# Patient Record
Sex: Male | Born: 1935 | Race: White | Hispanic: No | Marital: Married | Smoking: Former smoker
Health system: Southern US, Community
[De-identification: ages and names within clinical notes are randomized; demographics above are authoritative.]

## PROBLEM LIST (undated history)

## (undated) DIAGNOSIS — J449 Chronic obstructive pulmonary disease, unspecified: Secondary | ICD-10-CM

## (undated) DIAGNOSIS — I2510 Atherosclerotic heart disease of native coronary artery without angina pectoris: Secondary | ICD-10-CM

## (undated) DIAGNOSIS — I10 Essential (primary) hypertension: Secondary | ICD-10-CM

## (undated) DIAGNOSIS — I442 Atrioventricular block, complete: Secondary | ICD-10-CM

## (undated) DIAGNOSIS — E785 Hyperlipidemia, unspecified: Secondary | ICD-10-CM

## (undated) HISTORY — PX: PACEMAKER IMPLANT: EP1218

---

## 2013-08-26 ENCOUNTER — Ambulatory Visit: Payer: Self-pay | Admitting: Cardiology

## 2013-08-26 LAB — CBC WITH DIFFERENTIAL/PLATELET
BASOS ABS: 0 10*3/uL (ref 0.0–0.1)
Basophil %: 0.6 %
EOS ABS: 0.3 10*3/uL (ref 0.0–0.7)
Eosinophil %: 4.1 %
HCT: 41.6 % (ref 40.0–52.0)
HGB: 14 g/dL (ref 13.0–18.0)
Lymphocyte #: 1.7 10*3/uL (ref 1.0–3.6)
Lymphocyte %: 23.4 %
MCH: 33.9 pg (ref 26.0–34.0)
MCHC: 33.7 g/dL (ref 32.0–36.0)
MCV: 101 fL — ABNORMAL HIGH (ref 80–100)
MONOS PCT: 8.2 %
Monocyte #: 0.6 x10 3/mm (ref 0.2–1.0)
Neutrophil #: 4.5 10*3/uL (ref 1.4–6.5)
Neutrophil %: 63.7 %
Platelet: 152 10*3/uL (ref 150–440)
RBC: 4.14 10*6/uL — ABNORMAL LOW (ref 4.40–5.90)
RDW: 13.9 % (ref 11.5–14.5)
WBC: 7.1 10*3/uL (ref 3.8–10.6)

## 2013-08-26 LAB — BASIC METABOLIC PANEL
Anion Gap: 5 — ABNORMAL LOW (ref 7–16)
BUN: 16 mg/dL (ref 7–18)
CHLORIDE: 103 mmol/L (ref 98–107)
Calcium, Total: 9.1 mg/dL (ref 8.5–10.1)
Co2: 30 mmol/L (ref 21–32)
Creatinine: 1.04 mg/dL (ref 0.60–1.30)
EGFR (African American): >60
EGFR (Non-African Amer.): >60
Glucose: 93 mg/dL (ref 65–99)
OSMOLALITY: 277 (ref 275–301)
POTASSIUM: 4.1 mmol/L (ref 3.5–5.1)
SODIUM: 138 mmol/L (ref 136–145)

## 2013-08-26 LAB — APTT: ACTIVATED PTT: 29.5 s (ref 23.6–35.9)

## 2013-08-26 LAB — PROTIME-INR
INR: 1
PROTHROMBIN TIME: 13.3 s (ref 11.5–14.7)

## 2013-09-09 ENCOUNTER — Ambulatory Visit: Payer: Self-pay | Admitting: Cardiology

## 2014-07-16 NOTE — Op Note (Signed)
PATIENT NAME:  Gary York, Gary York MR#:  [id number] DATE OF BIRTH:  [birth year]  DATE OF PROCEDURE:  09/09/2013  PRIMARY CARE PHYSICIAN:  Dr. Burnadette PopLinthavong  PREPROCEDURE DIAGNOSES:  1.  Complete heart block.  2.  Elective replacement indication.   PROCEDURE: Dual-chamber pacemaker generator change out.   OPERATOR:  Marcina MillardAlexander Paraschos, MD  INDICATION: The patient is an 81 year old gentleman, status post dual-chamber pacemaker for complete heart block. Recent pacemaker interrogation has shown the pacemaker was at elective replacement indication, with a ventricular escape rate in the 30s and 40s. The procedure risks, benefits, and alternatives of dual-chamber pacemaker generator change out were explained to the patient, and informed written consent was obtained.   DESCRIPTION OF PROCEDURE: The patient was brought to the Operating Room in a fasting state. The right pectoral region was prepped and draped in a sterile manner. Anesthesia was obtained with 1% Xylocaine locally. A 6 cm incision was performed over the old pacemaker generator site. The pacemaker generator was retrieved by electrocautery and blunt dissection. The leads were disconnected from the pacemaker generator and interrogated. After proper thresholds were obtained, the leads were reconnected to a new dual-chamber rate-responsive pacemaker generator ([employer] [id number]) and positioned in the pocket. The pocket was irrigated with gentamicin solution. The incision was closed with 2-0 and 4-0 Vicryl, respectively. Steri-Strips and a pressure dressing were applied.    ____________________________ Marcina MillardAlexander Paraschos, MD ap:mr D: 09/09/2013 13:34:19 ET T: 09/09/2013 20:30:50 ET JOB#: [id number]  cc: Marcina MillardAlexander Paraschos, MD, <Dictator> Marcina MillardALEXANDER PARASCHOS MD ELECTRONICALLY SIGNED 09/16/2013 8:28

## 2016-03-25 DEATH — deceased

## 2017-02-04 ENCOUNTER — Emergency Department
Admission: EM | Admit: 2017-02-04 | Discharge: 2017-02-04 | Disposition: A | Discharge status: Home / Self Care | Payer: Self-pay

## 2017-02-04 NOTE — ED Notes (Signed)
PT went to bathroom before triage couple be finished and was able to have a BM. PT reports relief from abd pain and requests to go home. PT no longer wants blood or vitals to be checked. PT in NAD and daughter verbalized feeling safe taking pt home.

## 2017-09-03 ENCOUNTER — Encounter: Payer: Self-pay | Admitting: Emergency Medicine

## 2017-09-03 ENCOUNTER — Other Ambulatory Visit: Payer: Self-pay

## 2017-09-03 ENCOUNTER — Emergency Department
Admission: EM | Admit: 2017-09-03 | Discharge: 2017-09-03 | Disposition: A | Discharge status: Home / Self Care | Payer: Medicare Other | Attending: Emergency Medicine | Admitting: Emergency Medicine

## 2017-09-03 ENCOUNTER — Emergency Department: Payer: Medicare Other

## 2017-09-03 DIAGNOSIS — R05 Cough: Secondary | ICD-10-CM | POA: Insufficient documentation

## 2017-09-03 DIAGNOSIS — Z87891 Personal history of nicotine dependence: Secondary | ICD-10-CM | POA: Insufficient documentation

## 2017-09-03 DIAGNOSIS — R1012 Left upper quadrant pain: Secondary | ICD-10-CM | POA: Insufficient documentation

## 2017-09-03 DIAGNOSIS — R059 Cough, unspecified: Secondary | ICD-10-CM

## 2017-09-03 DIAGNOSIS — R079 Chest pain, unspecified: Secondary | ICD-10-CM | POA: Diagnosis present

## 2017-09-03 DIAGNOSIS — I10 Essential (primary) hypertension: Secondary | ICD-10-CM | POA: Diagnosis not present

## 2017-09-03 DIAGNOSIS — R0789 Other chest pain: Secondary | ICD-10-CM

## 2017-09-03 HISTORY — DX: Essential (primary) hypertension: I10

## 2017-09-03 LAB — COMPREHENSIVE METABOLIC PANEL
ALT: 28 U/L (ref 17–63)
ANION GAP: 7 (ref 5–15)
AST: 26 U/L (ref 15–41)
Albumin: 4.3 g/dL (ref 3.5–5.0)
Alkaline Phosphatase: 43 U/L (ref 38–126)
BILIRUBIN TOTAL: 0.7 mg/dL (ref 0.3–1.2)
BUN: 14 mg/dL (ref 6–20)
CALCIUM: 9.2 mg/dL (ref 8.9–10.3)
CO2: 28 mmol/L (ref 22–32)
Chloride: 96 mmol/L — ABNORMAL LOW (ref 101–111)
Creatinine, Ser: 0.98 mg/dL (ref 0.61–1.24)
Glucose, Bld: 128 mg/dL — ABNORMAL HIGH (ref 65–99)
Potassium: 4.2 mmol/L (ref 3.5–5.1)
SODIUM: 131 mmol/L — AB (ref 135–145)
TOTAL PROTEIN: 7.2 g/dL (ref 6.5–8.1)

## 2017-09-03 LAB — URINALYSIS, COMPLETE (UACMP) WITH MICROSCOPIC
BILIRUBIN URINE: NEGATIVE
Bacteria, UA: NONE SEEN
GLUCOSE, UA: NEGATIVE mg/dL
HGB URINE DIPSTICK: NEGATIVE
Ketones, ur: NEGATIVE mg/dL
Leukocytes, UA: NEGATIVE
NITRITE: NEGATIVE
PROTEIN: NEGATIVE mg/dL
Specific Gravity, Urine: 1.01 (ref 1.005–1.030)
Squamous Epithelial / LPF: NONE SEEN (ref 0–5)
pH: 7 (ref 5.0–8.0)

## 2017-09-03 LAB — CBC
HCT: 38.2 % — ABNORMAL LOW (ref 40.0–52.0)
HEMOGLOBIN: 13.5 g/dL (ref 13.0–18.0)
MCH: 35.2 pg — ABNORMAL HIGH (ref 26.0–34.0)
MCHC: 35.4 g/dL (ref 32.0–36.0)
MCV: 99.3 fL (ref 80.0–100.0)
Platelets: 193 10*3/uL (ref 150–440)
RBC: 3.85 MIL/uL — AB (ref 4.40–5.90)
RDW: 14.1 % (ref 11.5–14.5)
WBC: 8.2 10*3/uL (ref 3.8–10.6)

## 2017-09-03 LAB — LIPASE, BLOOD: Lipase: 21 U/L (ref 11–51)

## 2017-09-03 LAB — TROPONIN I

## 2017-09-03 MED ORDER — BENZONATATE 100 MG PO CAPS: 100.0000 mg | ORAL_CAPSULE | Freq: Three times a day (TID) | ORAL | 0 refills | Status: DC | PRN

## 2017-09-03 MED ORDER — GUAIFENESIN-CODEINE 100-10 MG/5ML PO SOLN: 5.0000 mL | Freq: Four times a day (QID) | ORAL | 0 refills | Status: DC | PRN

## 2017-09-03 NOTE — ED Provider Notes (Signed)
[facility] Emergency Department Provider Note  Time seen: 10:49 AM  I have reviewed the triage vital signs and the nursing notes.   HISTORY  Chief Complaint Abdominal Pain    HPI Gary York is a 84 y.o. male with a past medical history of hypertension presents to the emergency department for left lower chest/left upper abdominal discomfort.  According to the patient he has a chronic cough, states he has had it for 20 years.  Daughter states it appears to be somewhat worse recently when compared to baseline.  Since last night he has been complaining of pain in his left upper quadrant/left lower chest with cough only.  Denies any nausea, shortness of breath, diaphoresis.  Occasionally will spit up a clear sputum per patient.  Denies any fever.  Denies vomiting diarrhea dysuria or hematuria.   Past Medical History:  Diagnosis Date  . Hypertension     There are no active problems to display for this patient.   Past Surgical History:  Procedure Laterality Date  . PACEMAKER IMPLANT      Prior to Admission medications   Not on File    No Known Allergies  History reviewed. No pertinent family history.  Social History Social History   Tobacco Use  . Smoking status: Former Smoker    Packs/day: 1.00    Years: 20.00    Pack years: 20.00    Types: Cigarettes    Last attempt to quit: 1990    Years since quitting: 29.4  . Smokeless tobacco: Never Used  Substance Use Topics  . Alcohol use: Not on file  . Drug use: Not on file    Review of Systems Constitutional: Negative for fever. Eyes: Negative for visual complaints ENT: Negative for recent illness/congestion Cardiovascular: Mild left lower chest pain only when coughing Respiratory: Negative for shortness of breath.  Positive for cough. Gastrointestinal: Mild left upper quadrant pain only when coughing.  Negative for vomiting or diarrhea. Genitourinary: Negative for hematuria or  dysuria Musculoskeletal: Negative for musculoskeletal complaints Skin: Negative for skin complaints  Neurological: Negative for headache All other ROS negative  ____________________________________________   PHYSICAL EXAM:  VITAL SIGNS: ED Triage Vitals  Enc Vitals Group     BP 09/03/17 0851 (!) 173/77     Pulse Rate 09/03/17 0851 70     Resp 09/03/17 0851 18     Temp 09/03/17 0851 97.6 F (36.4 C)     Temp Source 09/03/17 0851 Oral     SpO2 09/03/17 0851 95 %     Weight 09/03/17 0852 230 lb (104.3 kg)     Height 09/03/17 0852 5\' 9"  (1.753 m)     Head Circumference --      Peak Flow --      Pain Score 09/03/17 0851 5     Pain Loc --      Pain Edu? --      Excl. in GC? --    Constitutional: Alert and oriented. Well appearing and in no distress. Eyes: Normal exam ENT   Head: Normocephalic and atraumatic.   Mouth/Throat: Mucous membranes are moist. Cardiovascular: Normal rate, regular rhythm. Respiratory: Normal respiratory effort without tachypnea nor retractions. Breath sounds are clear  Gastrointestinal: Soft, states slight tenderness in left upper quadrant with palpation.  No rebound, guarding or distention. Musculoskeletal: Nontender with normal range of motion in all extremities.  Slight pedal edema bilaterally. Neurologic:  Normal speech and language. No gross focal neurologic deficits Skin:  Skin is warm, dry and intact.  Psychiatric: Mood and affect are normal.   ____________________________________________    EKG  EKG reviewed and interpreted by myself shows an atrial ventricular paced rhythm at 70 bpm with no concerning findings.  ____________________________________________    RADIOLOGY  Chest x-ray negative for acute abnormality.  ____________________________________________   INITIAL IMPRESSION / ASSESSMENT AND PLAN / ED COURSE  Pertinent labs & imaging results that were available during my care of the patient were reviewed by me and  considered in my medical decision making (see chart for details).  Patient presents to the emergency department for left lower chest, left upper abdominal discomfort but only when coughing.  States he has been coughing for 20 years, daughter states it is somewhat worse recently.  Differential includes pneumonia, chest wall discomfort, muscular strain, pneumothorax, ACS.  Chest x-ray is negative.  Labs are within normal limits including negative troponin.  EKG shows no acute findings.  Overall the patient appears very well, highly suspect muscular strain due to cough.  We will treat the patient's cough with Tessalon Perles during the day and a codeine cough medication tonight.  Patient agreeable to this plan of care.  Family agreeable to plan of care.  I discussed return precautions. ____________________________________________   FINAL CLINICAL IMPRESSION(S) / ED DIAGNOSES  Cough Chest wall pain    Minna Antis, MD 09/03/17 1111

## 2017-09-03 NOTE — ED Notes (Signed)

## 2017-09-03 NOTE — ED Triage Notes (Signed)
[facility] ED [facility] c/o pain to mid L abd starting last night. Pt denies n/v/d. EMS called by pt's daughter. EMS also report pt's spouse passed within the few weeks.

## 2017-09-09 ENCOUNTER — Inpatient Hospital Stay
Admit: 2017-09-09 | Discharge: 2017-09-09 | Disposition: A | Discharge status: Home / Self Care | Payer: Medicare Other | Attending: Internal Medicine | Admitting: Internal Medicine

## 2017-09-09 ENCOUNTER — Inpatient Hospital Stay
Admission: EM | Admit: 2017-09-09 | Discharge: 2017-09-12 | DRG: 281 | Disposition: A | Discharge status: Home / Self Care | Payer: Medicare Other | Attending: Internal Medicine | Admitting: Internal Medicine

## 2017-09-09 ENCOUNTER — Emergency Department: Payer: Medicare Other

## 2017-09-09 ENCOUNTER — Other Ambulatory Visit: Payer: Self-pay

## 2017-09-09 DIAGNOSIS — I25110 Atherosclerotic heart disease of native coronary artery with unstable angina pectoris: Secondary | ICD-10-CM | POA: Diagnosis present

## 2017-09-09 DIAGNOSIS — E782 Mixed hyperlipidemia: Secondary | ICD-10-CM | POA: Diagnosis present

## 2017-09-09 DIAGNOSIS — H9190 Unspecified hearing loss, unspecified ear: Secondary | ICD-10-CM | POA: Diagnosis present

## 2017-09-09 DIAGNOSIS — Z8679 Personal history of other diseases of the circulatory system: Secondary | ICD-10-CM | POA: Diagnosis not present

## 2017-09-09 DIAGNOSIS — Z950 Presence of cardiac pacemaker: Secondary | ICD-10-CM | POA: Diagnosis not present

## 2017-09-09 DIAGNOSIS — J42 Unspecified chronic bronchitis: Secondary | ICD-10-CM | POA: Diagnosis present

## 2017-09-09 DIAGNOSIS — E860 Dehydration: Secondary | ICD-10-CM | POA: Diagnosis present

## 2017-09-09 DIAGNOSIS — I10 Essential (primary) hypertension: Secondary | ICD-10-CM | POA: Diagnosis present

## 2017-09-09 DIAGNOSIS — Z79899 Other long term (current) drug therapy: Secondary | ICD-10-CM | POA: Diagnosis not present

## 2017-09-09 DIAGNOSIS — Z634 Disappearance and death of family member: Secondary | ICD-10-CM

## 2017-09-09 DIAGNOSIS — R079 Chest pain, unspecified: Secondary | ICD-10-CM | POA: Diagnosis present

## 2017-09-09 DIAGNOSIS — E222 Syndrome of inappropriate secretion of antidiuretic hormone: Secondary | ICD-10-CM | POA: Diagnosis present

## 2017-09-09 DIAGNOSIS — I214 Non-ST elevation (NSTEMI) myocardial infarction: Principal | ICD-10-CM | POA: Diagnosis present

## 2017-09-09 DIAGNOSIS — Z66 Do not resuscitate: Secondary | ICD-10-CM | POA: Diagnosis present

## 2017-09-09 DIAGNOSIS — I700 Atherosclerosis of aorta: Secondary | ICD-10-CM | POA: Diagnosis present

## 2017-09-09 DIAGNOSIS — Z87891 Personal history of nicotine dependence: Secondary | ICD-10-CM

## 2017-09-09 HISTORY — DX: Atrioventricular block, complete: I44.2

## 2017-09-09 LAB — CBC WITH DIFFERENTIAL/PLATELET
Basophils Absolute: 0 10*3/uL (ref 0–0.1)
Basophils Relative: 0 %
EOS ABS: 0.3 10*3/uL (ref 0–0.7)
EOS PCT: 3 %
HCT: 39.9 % — ABNORMAL LOW (ref 40.0–52.0)
Hemoglobin: 13.8 g/dL (ref 13.0–18.0)
LYMPHS ABS: 1.5 10*3/uL (ref 1.0–3.6)
Lymphocytes Relative: 15 %
MCH: 34.2 pg — AB (ref 26.0–34.0)
MCHC: 34.6 g/dL (ref 32.0–36.0)
MCV: 98.9 fL (ref 80.0–100.0)
MONOS PCT: 7 %
Monocytes Absolute: 0.7 10*3/uL (ref 0.2–1.0)
Neutro Abs: 7.5 10*3/uL — ABNORMAL HIGH (ref 1.4–6.5)
Neutrophils Relative %: 75 %
PLATELETS: 189 10*3/uL (ref 150–440)
RBC: 4.04 MIL/uL — ABNORMAL LOW (ref 4.40–5.90)
RDW: 14.2 % (ref 11.5–14.5)
WBC: 10 10*3/uL (ref 3.8–10.6)

## 2017-09-09 LAB — SODIUM
Sodium: 127 mmol/L — ABNORMAL LOW (ref 135–145)
Sodium: 127 mmol/L — ABNORMAL LOW (ref 135–145)

## 2017-09-09 LAB — COMPREHENSIVE METABOLIC PANEL
ALT: 41 U/L (ref 17–63)
ANION GAP: 9 (ref 5–15)
AST: 33 U/L (ref 15–41)
Albumin: 4.5 g/dL (ref 3.5–5.0)
Alkaline Phosphatase: 43 U/L (ref 38–126)
BUN: 21 mg/dL — ABNORMAL HIGH (ref 6–20)
CALCIUM: 8.6 mg/dL — AB (ref 8.9–10.3)
CHLORIDE: 89 mmol/L — AB (ref 101–111)
CO2: 25 mmol/L (ref 22–32)
Creatinine, Ser: 1.13 mg/dL (ref 0.61–1.24)
GFR calc non Af Amer: 54 mL/min — ABNORMAL LOW (ref >60)
Glucose, Bld: 138 mg/dL — ABNORMAL HIGH (ref 65–99)
Potassium: 4.5 mmol/L (ref 3.5–5.1)
SODIUM: 123 mmol/L — AB (ref 135–145)
Total Bilirubin: 0.6 mg/dL (ref 0.3–1.2)
Total Protein: 7.3 g/dL (ref 6.5–8.1)

## 2017-09-09 LAB — TROPONIN I
Troponin I: 0.11 ng/mL (ref <0.03)
Troponin I: 0.92 ng/mL (ref <0.03)
Troponin I: 1.22 ng/mL (ref <0.03)

## 2017-09-09 LAB — CHLORIDE, URINE, RANDOM: Chloride Urine: 17 mmol/L

## 2017-09-09 LAB — SODIUM, URINE, RANDOM: Sodium, Ur: 22 mmol/L

## 2017-09-09 MED ORDER — METOPROLOL TARTRATE 25 MG PO TABS
25.0000 mg | ORAL_TABLET | Freq: Two times a day (BID) | ORAL | Status: DC
  Administered 2017-09-09 – 2017-09-12 (×6): 25 mg via ORAL
  Filled 2017-09-09 (×6): qty 1

## 2017-09-09 MED ORDER — NITROGLYCERIN 0.4 MG SL SUBL
0.4000 mg | SUBLINGUAL_TABLET | Freq: Once | SUBLINGUAL | Status: AC
  Administered 2017-09-09: 0.4 mg via SUBLINGUAL

## 2017-09-09 MED ORDER — DOXAZOSIN MESYLATE 2 MG PO TABS
2.0000 mg | ORAL_TABLET | Freq: Every day | ORAL | Status: DC
  Administered 2017-09-09 – 2017-09-12 (×4): 2 mg via ORAL
  Filled 2017-09-09 (×4): qty 1

## 2017-09-09 MED ORDER — NITROGLYCERIN 0.4 MG SL SUBL: 0.4000 mg | SUBLINGUAL_TABLET | SUBLINGUAL | Status: DC | PRN

## 2017-09-09 MED ORDER — LOSARTAN POTASSIUM 50 MG PO TABS
100.0000 mg | ORAL_TABLET | Freq: Every day | ORAL | Status: DC
  Administered 2017-09-09 – 2017-09-12 (×4): 100 mg via ORAL
  Filled 2017-09-09 (×4): qty 2

## 2017-09-09 MED ORDER — ENOXAPARIN SODIUM 40 MG/0.4ML ~~LOC~~ SOLN
40.0000 mg | Freq: Every day | SUBCUTANEOUS | Status: DC
  Administered 2017-09-09: 40 mg via SUBCUTANEOUS
  Filled 2017-09-09: qty 0.4

## 2017-09-09 MED ORDER — DILTIAZEM HCL ER COATED BEADS 180 MG PO CP24
180.0000 mg | ORAL_CAPSULE | Freq: Every day | ORAL | Status: DC
  Administered 2017-09-09 – 2017-09-12 (×4): 180 mg via ORAL
  Filled 2017-09-09 (×4): qty 1

## 2017-09-09 MED ORDER — SODIUM CHLORIDE 0.9 % IV SOLN
Freq: Once | INTRAVENOUS | Status: AC
  Administered 2017-09-09: 12:00:00 via INTRAVENOUS

## 2017-09-09 MED ORDER — ONDANSETRON HCL 4 MG/2ML IJ SOLN: 4.0000 mg | Freq: Four times a day (QID) | INTRAMUSCULAR | Status: DC | PRN

## 2017-09-09 MED ORDER — BENZONATATE 100 MG PO CAPS
100.0000 mg | ORAL_CAPSULE | Freq: Three times a day (TID) | ORAL | Status: DC
  Administered 2017-09-09 – 2017-09-12 (×10): 100 mg via ORAL
  Filled 2017-09-09 (×10): qty 1

## 2017-09-09 MED ORDER — ENOXAPARIN SODIUM 40 MG/0.4ML ~~LOC~~ SOLN: 40.0000 mg | SUBCUTANEOUS | Status: DC

## 2017-09-09 MED ORDER — GUAIFENESIN-CODEINE 100-10 MG/5ML PO SOLN
5.0000 mL | Freq: Four times a day (QID) | ORAL | Status: DC | PRN
  Filled 2017-09-09: qty 5

## 2017-09-09 MED ORDER — NITROGLYCERIN 0.4 MG SL SUBL
0.4000 mg | SUBLINGUAL_TABLET | Freq: Once | SUBLINGUAL | Status: AC
  Administered 2017-09-09: 0.4 mg via SUBLINGUAL
  Filled 2017-09-09: qty 1

## 2017-09-09 MED ORDER — ASPIRIN EC 81 MG PO TBEC
81.0000 mg | DELAYED_RELEASE_TABLET | Freq: Every day | ORAL | Status: DC
  Administered 2017-09-10 – 2017-09-12 (×3): 81 mg via ORAL
  Filled 2017-09-09 (×3): qty 1

## 2017-09-09 MED ORDER — ISOSORBIDE MONONITRATE ER 30 MG PO TB24
30.0000 mg | ORAL_TABLET | Freq: Every day | ORAL | Status: DC
  Administered 2017-09-09 – 2017-09-12 (×4): 30 mg via ORAL
  Filled 2017-09-09 (×4): qty 1

## 2017-09-09 MED ORDER — LOSARTAN POTASSIUM 50 MG PO TABS: 100.0000 mg | ORAL_TABLET | Freq: Every day | ORAL | Status: DC

## 2017-09-09 MED ORDER — METOPROLOL TARTRATE 25 MG PO TABS: 12.5000 mg | ORAL_TABLET | Freq: Two times a day (BID) | ORAL | Status: DC

## 2017-09-09 MED ORDER — ATORVASTATIN CALCIUM 20 MG PO TABS
20.0000 mg | ORAL_TABLET | Freq: Every day | ORAL | Status: DC
  Administered 2017-09-09 – 2017-09-10 (×2): 20 mg via ORAL
  Filled 2017-09-09 (×2): qty 1

## 2017-09-09 MED ORDER — ACETAMINOPHEN 325 MG PO TABS: 650.0000 mg | ORAL_TABLET | Freq: Four times a day (QID) | ORAL | Status: DC | PRN

## 2017-09-09 MED ORDER — ACETAMINOPHEN 650 MG RE SUPP: 650.0000 mg | Freq: Four times a day (QID) | RECTAL | Status: DC | PRN

## 2017-09-09 MED ORDER — FAMOTIDINE 20 MG PO TABS
20.0000 mg | ORAL_TABLET | Freq: Every day | ORAL | Status: DC
  Administered 2017-09-09 – 2017-09-12 (×4): 20 mg via ORAL
  Filled 2017-09-09 (×4): qty 1

## 2017-09-09 MED ORDER — ONDANSETRON HCL 4 MG PO TABS: 4.0000 mg | ORAL_TABLET | Freq: Four times a day (QID) | ORAL | Status: DC | PRN

## 2017-09-09 MED ORDER — SENNOSIDES-DOCUSATE SODIUM 8.6-50 MG PO TABS: 1.0000 | ORAL_TABLET | Freq: Every evening | ORAL | Status: DC | PRN

## 2017-09-09 MED ORDER — CLOPIDOGREL BISULFATE 75 MG PO TABS
75.0000 mg | ORAL_TABLET | Freq: Every day | ORAL | Status: DC
  Administered 2017-09-09 – 2017-09-12 (×4): 75 mg via ORAL
  Filled 2017-09-09 (×4): qty 1

## 2017-09-09 MED ORDER — SODIUM CHLORIDE 0.9 % IV SOLN
INTRAVENOUS | Status: DC
  Administered 2017-09-09 – 2017-09-10 (×3): via INTRAVENOUS

## 2017-09-09 MED ORDER — ACETAMINOPHEN 325 MG PO TABS: 650.0000 mg | ORAL_TABLET | ORAL | Status: DC | PRN

## 2017-09-09 MED ORDER — ASPIRIN 81 MG PO CHEW
324.0000 mg | CHEWABLE_TABLET | Freq: Once | ORAL | Status: AC
  Administered 2017-09-09: 324 mg via ORAL
  Filled 2017-09-09: qty 4

## 2017-09-09 NOTE — ED Notes (Signed)
Green top recollected per lab request.  

## 2017-09-09 NOTE — ED Provider Notes (Signed)
[facility] Emergency Department Provider Note       Time seen: ----------------------------------------- 9:40 AM on 09/09/2017 -----------------------------------------   I have reviewed the triage vital signs and the nursing notes.  HISTORY   Chief Complaint Chest Pain    HPI Gary York is a 84 y.o. male with a history of hypertension who presents to the ED for chest pain.  Patient describes chest pain that started around 8:30 AM.  Patient states he has dull pain over his right breast it is 8 out of 10.  He states occasionally he has had difficulty getting his breath.  He also notes that his wife died last week under hospice care.  He denies sweats, nausea, shortness of breath.  Past Medical History:  Diagnosis Date  . Hypertension     There are no active problems to display for this patient.   Past Surgical History:  Procedure Laterality Date  . PACEMAKER IMPLANT      Allergies Patient has no known allergies.  Social History Social History   Tobacco Use  . Smoking status: Former Smoker    Packs/day: 1.00    Years: 20.00    Pack years: 20.00    Types: Cigarettes    Last attempt to quit: 1990    Years since quitting: 29.4  . Smokeless tobacco: Never Used  Substance Use Topics  . Alcohol use: Not Currently  . Drug use: Never   Review of Systems Constitutional: Negative for fever. Cardiovascular: Positive for chest pain Respiratory: Negative for shortness of breath. Gastrointestinal: Negative for abdominal pain, vomiting and diarrhea. Musculoskeletal: Negative for back pain. Skin: Negative for rash. Neurological: Negative for headaches, focal weakness or numbness.  All systems negative/normal/unremarkable except as stated in the HPI  ____________________________________________   PHYSICAL EXAM:  VITAL SIGNS: ED Triage Vitals  Enc Vitals Group     BP 09/09/17 0929 (!) 162/91     Pulse Rate 09/09/17 0929 72     Resp  09/09/17 0929 17     Temp 09/09/17 0929 98.4 F (36.9 C)     Temp Source 09/09/17 0929 Oral     SpO2 09/09/17 0921 97 %     Weight 09/09/17 0923 200 lb (90.7 kg)     Height 09/09/17 0923 5\' 9"  (1.753 m)     Head Circumference --      Peak Flow --      Pain Score 09/09/17 0923 8     Pain Loc --      Pain Edu? --      Excl. in GC? --    Constitutional: Alert and oriented. Well appearing and in no distress. Eyes: Conjunctivae are normal. Normal extraocular movements. ENT   Head: Normocephalic and atraumatic.   Nose: No congestion/rhinnorhea.   Mouth/Throat: Mucous membranes are moist.   Neck: No stridor. Cardiovascular: Normal rate, regular rhythm. No murmurs, rubs, or gallops. Respiratory: Normal respiratory effort without tachypnea nor retractions. Breath sounds are clear and equal bilaterally. No wheezes/rales/rhonchi. Gastrointestinal: Soft and nontender. Normal bowel sounds Musculoskeletal: Nontender with normal range of motion in extremities. No lower extremity tenderness nor edema. Neurologic:  Normal speech and language. No gross focal neurologic deficits are appreciated.  Skin:  Skin is warm, dry and intact. No rash noted. Psychiatric: Mood and affect are normal. Speech and behavior are normal.  ____________________________________________  EKG: Interpreted by me.  AV dual paced rhythm with a rate of 70 bpm, normal pacemaker function is noted  ____________________________________________  ED COURSE:  As part of my medical decision making, I reviewed the following data within the electronic MEDICAL RECORD NUMBER History obtained from family if available, nursing notes, old chart and ekg, as well as notes from prior ED visits. Patient presented for right-sided chest pain, we will assess with labs and imaging as indicated at this time.   Procedures ____________________________________________   LABS (pertinent positives/negatives)  Labs Reviewed  CBC WITH  DIFFERENTIAL/PLATELET - Abnormal; Notable for the following components:      Result Value   RBC 4.04 (*)    HCT 39.9 (*)    MCH 34.2 (*)    Neutro Abs 7.5 (*)    All other components within normal limits  COMPREHENSIVE METABOLIC PANEL - Abnormal; Notable for the following components:   Sodium 123 (*)    Chloride 89 (*)    Glucose, Bld 138 (*)    BUN 21 (*)    Calcium 8.6 (*)    GFR calc non Af Amer 54 (*)    All other components within normal limits  TROPONIN I - Abnormal; Notable for the following components:   Troponin I 0.11 (*)    All other components within normal limits    RADIOLOGY Images were viewed by me  Chest x-ray IMPRESSION: Stable chronic elevation of the right hemidiaphragm with chronic right base atelectasis or scarring.  Cardiomegaly.  ____________________________________________  DIFFERENTIAL DIAGNOSIS   Acute grief reaction, anxiety, muscular skeletal pain, unstable angina, PE  FINAL ASSESSMENT AND PLAN  Unstable angina, hyponatremia   Plan: The patient had presented for right-sided chest pain. Patient's labs did indicate hyponatremia as well as an elevated troponin. Patient's imaging does not reveal any acute process.  This is likely due to grief from the loss of his wife.  Have given him aspirin and IV fluids.  I will discuss with the hospitalist for admission.   Gary DashJohnathan E Williams, MD   Note: This note was generated in part or whole with voice recognition software. Voice recognition is usually quite accurate but there are transcription errors that can and very often do occur. I apologize for any typographical errors that were not detected and corrected.     Emily FilbertWilliams, Jonathan E, MD 09/09/17 [phone]

## 2017-09-09 NOTE — Progress Notes (Signed)
Family Meeting Note  Advance Directive:yes  Today a meeting took place with the Patient.    The following clinical team members were present during this meeting:MD  The following were discussed:Patient's diagnosis: Unstable angina elevated troponin, hyponatremia, bereavement reaction, treatment plan of care, other comorbidities including dual-chamber pacemaker, essential hypertension I discussed in detail with the patient.  He verbalized understanding of the plan.     Patient's progosis: Unable to determine and Goals for treatment: DNR  Daughter Kriste BasqueBecky is a healthcare POA.  Patient states he has a living will get a copy to the chart  Additional follow-up to be provided: Hospitalist and cardiology  Time spent during discussion:17 MIN  Ramonita LabAruna Eames Dibiasio, MD

## 2017-09-09 NOTE — H&P (Signed)
[facility] - [place] [facility]   PATIENT NAME: Gary York    MR#:  [id number]  DATE OF BIRTH:  [birth year]  DATE OF ADMISSION:  09/09/2017  PRIMARY CARE PHYSICIAN: Patient, No Pcp Per   REQUESTING/REFERRING PHYSICIAN: Dr. Mayford Knife  CHIEF COMPLAINT:  Chest pain  HISTORY OF PRESENT ILLNESS:  Gary York  is a 84 y.o. male with a known history of hypertension, dual-chamber pacemaker sees Dr. Gwen Pounds as an outpatient is presented to the ED with a chief complaint of chest pain.  Patient states chest pain started at around 8:30 AM , locating to right lateral part of the chest with no radiation.  Denies any shortness of breath.   patient states his wife deceased last week and feels down but has very good family support.  Daughter and son are involved.  PAST MEDICAL HISTORY:   Past Medical History:  Diagnosis Date  . Hypertension     PAST SURGICAL HISTOIRY:   Past Surgical History:  Procedure Laterality Date  . PACEMAKER IMPLANT      SOCIAL HISTORY:   Social History   Tobacco Use  . Smoking status: Former Smoker    Packs/day: 1.00    Years: 20.00    Pack years: 20.00    Types: Cigarettes    Last attempt to quit: 1990    Years since quitting: 29.4  . Smokeless tobacco: Never Used  Substance Use Topics  . Alcohol use: Not Currently    FAMILY HISTORY:  No family history on file.  DRUG ALLERGIES:  No Known Allergies  REVIEW OF SYSTEMS:  CONSTITUTIONAL: No fever, fatigue or weakness.  EYES: No blurred or double vision.  EARS, NOSE, AND THROAT: No tinnitus or ear pain.  RESPIRATORY: No cough, shortness of breath, wheezing or hemoptysis.  CARDIOVASCULAR: Reporting right-sided chest pain, denies orthopnea, edema.  GASTROINTESTINAL: No nausea, vomiting, diarrhea or abdominal pain.  GENITOURINARY: No dysuria, hematuria.  ENDOCRINE: No polyuria, nocturia,  HEMATOLOGY: No anemia, easy bruising or bleeding SKIN: No rash or  lesion. MUSCULOSKELETAL: No joint pain or arthritis.   NEUROLOGIC: No tingling, numbness, weakness.  PSYCHIATRY: No anxiety or depression.   MEDICATIONS AT HOME:   Prior to Admission medications   Medication Sig Start Date End Date Taking? Authorizing Provider  benzonatate (TESSALON PERLES) 100 MG capsule Take 1 capsule (100 mg total) by mouth 3 (three) times daily as needed for up to 10 days for cough. 09/03/17 09/13/17 Yes Minna Antis, MD  diltiazem (DILTIAZEM CD) 180 MG 24 hr capsule Take 180 mg by mouth daily.   Yes [provider]  doxazosin (CARDURA) 2 MG tablet Take 2 mg by mouth daily. 08/19/17  Yes [provider]  guaiFENesin-codeine 100-10 MG/5ML syrup Take 5 mLs by mouth every 6 (six) hours as needed for cough. 09/03/17  Yes Minna Antis, MD  losartan (COZAAR) 100 MG tablet Take 100 mg by mouth daily. 11/20/16  Yes [provider]      VITAL SIGNS:  Blood pressure (!) 154/84, pulse 68, temperature 98.4 F (36.9 C), temperature source Oral, resp. rate 13, height 5\' 9"  (1.753 m), weight 90.7 kg (200 lb), SpO2 93 %.  PHYSICAL EXAMINATION:  GENERAL:  85 y.o.-year-old patient lying in the bed with no acute distress.  EYES: Pupils equal, round, reactive to light and accommodation. No scleral icterus. Extraocular muscles intact.  HEENT: Head atraumatic, normocephalic. Oropharynx and nasopharynx clear.  NECK:  Supple, no jugular venous distention. No thyroid enlargement, no tenderness.  LUNGS: Normal breath sounds bilaterally, no wheezing, rales,rhonchi or crepitation. No use of accessory muscles of respiration.  CARDIOVASCULAR: S1, S2 normal. No murmurs, rubs, or gallops.  Reproducible right-sided anterior chest wall pain ABDOMEN: Soft, nontender, nondistended. Bowel sounds present.   EXTREMITIES: No pedal edema, cyanosis, or clubbing.  NEUROLOGIC: Cranial nerves II through XII are intact. Muscle strength 5/5 in all extremities. Sensation intact.  Gait not checked.  PSYCHIATRIC: The patient is alert and oriented x 3.  SKIN: No obvious rash, lesion, or ulcer.   LABORATORY PANEL:   CBC Recent Labs  Lab 09/09/17 0927  WBC 10.0  HGB 13.8  HCT 39.9*  PLT 189   ------------------------------------------------------------------------------------------------------------------  Chemistries  Recent Labs  Lab 09/09/17 1048  NA 123*  K 4.5  CL 89*  CO2 25  GLUCOSE 138*  BUN 21*  CREATININE 1.13  CALCIUM 8.6*  AST 33  ALT 41  ALKPHOS 43  BILITOT 0.6   ------------------------------------------------------------------------------------------------------------------  Cardiac Enzymes Recent Labs  Lab 09/09/17 1048  TROPONINI 0.11*   ------------------------------------------------------------------------------------------------------------------  RADIOLOGY:  Dg Chest 2 View  Result Date: 09/09/2017 CLINICAL DATA:  Right chest pain EXAM: CHEST - 2 VIEW COMPARISON:  09/03/2017 FINDINGS: Right pacer remains in place, unchanged. Elevation of the right hemidiaphragm. Mild cardiomegaly. Right base atelectasis or scarring. No confluent opacity on the left. No effusions or edema. No acute bony abnormality. IMPRESSION: Stable chronic elevation of the right hemidiaphragm with chronic right base atelectasis or scarring. Cardiomegaly. Electronically Signed   By: Charlett Nose M.D.   On: 09/09/2017 10:06    EKG:   Orders placed or performed during the hospital encounter of 09/09/17  . ED EKG within 10 minutes  . ED EKG within 10 minutes  . EKG 12-Lead  . EKG 12-Lead  . EKG 12-Lead  . EKG 12-Lead  . EKG 12-Lead  . EKG 12-Lead  . EKG 12-lead  . EKG 12-Lead    IMPRESSION AND PLAN:   Gary York  is a 84 y.o. male with a known history of hypertension, dual-chamber pacemaker sees Dr. Gwen Pounds as an outpatient is presented to the ED with a chief complaint of chest pain.  Patient states chest pain started at around 8:30 AM ,  locating to right lateral part of the chest with no radiation.  Denies any shortness of breath  #Unstable angina with elevated troponin Admit to telemetry Cycle cardiac biomarkers We will get an echocardiogram Aspirin 3.5 mg was given in the ED.  We will continue aspirin 81 mg and continue ACE inhibitor, statin and small dose beta-blocker is added to the regimen Cardiology consult placed to Dr. Gwen Pounds  #Hyponatremia probably from dehydration Provide IV fluids  monitor serial sodium levels  patient is mentating fine Check urine osmolality, urine lites  check TSH   #Dehydration and hydration with IV fluids and check echocardiogram  #Essential hypertension continue Cozaar, Cardura and Cardizem Small dose beta-blocker is added to the regimen  #Bereavement reaction Patient is recuperating well has good family support  continue close monitoring   #Dual Chamber pacemaker Outpatient follow-up with cardiologist as recommended   DVT prophylaxis  All the records are reviewed and case discussed with ED provider. Management plans discussed with the patient, family and they are in agreement.  CODE STATUS: DNR  TOTAL TIME TAKING CARE OF THIS PATIENT: 42 minutes.   Note: This dictation was prepared with Dragon dictation along with smaller phrase technology. Any transcriptional errors that result from this process are unintentional.  Ramonita LabAruna Matai Carpenito M.D on 09/09/2017 at 1:42 PM  Between 7am to 6pm - Pager - [phone]  After 6pm go to [email or web address] - password [email or web address]  [facility]  Office  [phone]  CC: Primary care physician; Patient, No Pcp Per

## 2017-09-09 NOTE — ED Triage Notes (Addendum)
Pt arrived via ems for c/o chest pain that started at 830am- pt states pain is a dull ain over right breast and is 8/10 - denies shortness of breath, dizziness, N/V Pt spouse died last week under care of hospice

## 2017-09-09 NOTE — ED Notes (Signed)
Pt assisted to use urinal to void 

## 2017-09-09 NOTE — ED Notes (Signed)
Elevated troponin of 0.11 reported to Dr Mayford KnifeWilliams - no new orders given to this nurse at this time

## 2017-09-09 NOTE — Consult Note (Signed)
[facility] Cardiology Consultation Note  Patient ID: Gary York, MRN: [id number], DOB/AGE: Dec 23, 1921 84 y.o. Admit date: 09/09/2017   Date of Consult: 09/09/2017 Primary Physician: Patient, No Pcp Per Primary Cardiologist: Gwen Pounds  Chief Complaint:  Chief Complaint  Patient presents with  . Chest Pain   Reason for Consult: Chest pain  HPI: 84 y.o. male with known essential hypertension mixed hyperlipidemia cardiovascular disease with previous heart block status post dual-chamber pacemaker placement.  Patient has done fairly well on appropriate high intensity cholesterol therapy aspirin and metoprolol losartan for hypertension control and heart rate control when he had left-sided and substernal chest discomfort radiating into his back causing shortness of breath.  This lasted throughout the morning and was seen in the emergency room at which time he had an EKG showing AV pacing and had some relief of his chest pain with appropriate medication management.  Since then his pain has subsided to the most degree and now he has an elevated troponin of 0.92 consistent with non-ST elevation myocardial infarction.  We have discussed at length medication management at 84 years old would be the most appropriate approach and agreed by the family and the patient.  Only if ambulation causes worsening chest pain at maximal medication management would we further consider the possibility of further intervention.  Currently the patient is stable  Past Medical History:  Diagnosis Date  . Hypertension       Surgical History:  Past Surgical History:  Procedure Laterality Date  . PACEMAKER IMPLANT       Home Meds: Prior to Admission medications   Medication Sig Start Date End Date Taking? Authorizing Provider  benzonatate (TESSALON PERLES) 100 MG capsule Take 1 capsule (100 mg total) by mouth 3 (three) times daily as needed for up to 10 days for cough. 09/03/17 09/13/17 Yes Minna Antis, MD   diltiazem (DILTIAZEM CD) 180 MG 24 hr capsule Take 180 mg by mouth daily.   Yes [provider]  doxazosin (CARDURA) 2 MG tablet Take 2 mg by mouth daily. 08/19/17  Yes [provider]  guaiFENesin-codeine 100-10 MG/5ML syrup Take 5 mLs by mouth every 6 (six) hours as needed for cough. 09/03/17  Yes Minna Antis, MD  losartan (COZAAR) 100 MG tablet Take 100 mg by mouth daily. 11/20/16  Yes [provider]    Inpatient Medications:  . [START ON 09/10/2017] aspirin EC  81 mg Oral Daily  . atorvastatin  20 mg Oral q1800  . benzonatate  100 mg Oral TID  . diltiazem  180 mg Oral Daily  . doxazosin  2 mg Oral Daily  . enoxaparin (LOVENOX) injection  40 mg Subcutaneous q1800  . famotidine  20 mg Oral Daily  . losartan  100 mg Oral Daily  . metoprolol tartrate  12.5 mg Oral BID   . sodium chloride 75 mL/hr at 09/09/17 1433    Allergies: No Known Allergies  Social History   Socioeconomic History  . Marital status: Married    Spouse name: Not on file  . Number of children: Not on file  . Years of education: Not on file  . Highest education level: Not on file  Occupational History  . Not on file  Social Needs  . Financial resource strain: Not on file  . Food insecurity:    Worry: Not on file    Inability: Not on file  . Transportation needs:    Medical: Not on file    Non-medical: Not on  file  Tobacco Use  . Smoking status: Former Smoker    Packs/day: 1.00    Years: 20.00    Pack years: 20.00    Types: Cigarettes    Last attempt to quit: 1990    Years since quitting: 29.4  . Smokeless tobacco: Never Used  Substance and Sexual Activity  . Alcohol use: Not Currently  . Drug use: Never  . Sexual activity: Not on file  Lifestyle  . Physical activity:    Days per week: Not on file    Minutes per session: Not on file  . Stress: Not on file  Relationships  . Social connections:    Talks on phone: Not on file    Gets together: Not on file     Attends religious service: Not on file    Active member of club or organization: Not on file    Attends meetings of clubs or organizations: Not on file    Relationship status: Not on file  . Intimate partner violence:    Fear of current or ex partner: Not on file    Emotionally abused: Not on file    Physically abused: Not on file    Forced sexual activity: Not on file  Other Topics Concern  . Not on file  Social History Narrative  . Not on file     No family history on file.   Review of Systems Positive for chest pain Negative for: General:  chills, fever, night sweats or weight changes.  Cardiovascular: PND orthopnea syncope dizziness  Dermatological skin lesions rashes Respiratory: Cough congestion Urologic: Frequent urination urination at night and hematuria Abdominal: negative for nausea, vomiting, diarrhea, bright red blood per rectum, melena, or hematemesis Neurologic: negative for visual changes, and/or hearing changes  All other systems reviewed and are otherwise negative except as noted above.  Labs: Recent Labs    09/09/17 1048 09/09/17 1426  TROPONINI 0.11* 0.92*   Lab Results  Component Value Date   WBC 10.0 09/09/2017   HGB 13.8 09/09/2017   HCT 39.9 (L) 09/09/2017   MCV 98.9 09/09/2017   PLT 189 09/09/2017    Recent Labs  Lab 09/09/17 1048 09/09/17 1426  NA 123* 127*  K 4.5  --   CL 89*  --   CO2 25  --   BUN 21*  --   CREATININE 1.13  --   CALCIUM 8.6*  --   PROT 7.3  --   BILITOT 0.6  --   ALKPHOS 43  --   ALT 41  --   AST 33  --   GLUCOSE 138*  --    No results found for: CHOL, HDL, LDLCALC, TRIG No results found for: DDIMER  Radiology/Studies:  Dg Chest 2 View  Result Date: 09/09/2017 CLINICAL DATA:  Right chest pain EXAM: CHEST - 2 VIEW COMPARISON:  09/03/2017 FINDINGS: Right pacer remains in place, unchanged. Elevation of the right hemidiaphragm. Mild cardiomegaly. Right base atelectasis or scarring. No confluent opacity on the  left. No effusions or edema. No acute bony abnormality. IMPRESSION: Stable chronic elevation of the right hemidiaphragm with chronic right base atelectasis or scarring. Cardiomegaly. Electronically Signed   By: Charlett Nose M.D.   On: 09/09/2017 10:06   Dg Chest 2 View  Result Date: 09/03/2017 CLINICAL DATA:  LEFT side anterior lower chest pain with cough for 1 day EXAM: CHEST - 2 VIEW COMPARISON:  08/26/2013 FINDINGS: RIGHT subclavian transvenous pacemaker with sequential leads projecting at RIGHT atrium  and RIGHT ventricle, unchanged. Enlargement of cardiac silhouette. Atherosclerotic calcification aorta. Mediastinal contours and pulmonary vascularity otherwise normal. Increased elevation of RIGHT diaphragm with mild RIGHT basilar atelectasis. Minimal scarring LEFT base. Lungs otherwise clear. No pulmonary infiltrate, pleural effusion or pneumothorax. Bones demineralized. IMPRESSION: RIGHT basilar atelectasis and minimal LEFT basilar scarring. Enlargement of cardiac silhouette post pacemaker. No acute abnormalities. Electronically Signed   By: Ulyses SouthwardMark  Boles M.D.   On: 09/03/2017 09:37    EKG: AV pacing  Weights: Filed Weights   09/09/17 0923 09/09/17 1344  Weight: 200 lb (90.7 kg) 220 lb 12.8 oz (100.2 kg)     Physical Exam: Blood pressure (!) 156/66, pulse 69, temperature 98.2 F (36.8 C), resp. rate 17, height 5\' 9"  (1.753 m), weight 220 lb 12.8 oz (100.2 kg), SpO2 94 %. Body mass index is 32.61 kg/m. General: Well developed, well nourished, in no acute distress. Head eyes ears nose throat: Normocephalic, atraumatic, sclera non-icteric, no xanthomas, nares are without discharge. No apparent thyromegaly and/or mass  Lungs: Normal respiratory effort.  Few wheezes, no rales, no rhonchi.  Heart: RRR with normal S1 S2. no murmur gallop, no rub, PMI is normal size and placement, carotid upstroke normal without bruit, jugular venous pressure is normal Abdomen: Soft, non-tender, non-distended with  normoactive bowel sounds. No hepatomegaly. No rebound/guarding. No obvious abdominal masses. Abdominal aorta is normal size without bruit Extremities: Trace edema. no cyanosis, no clubbing, no ulcers  Peripheral : 2+ bilateral upper extremity pulses, 2+ bilateral femoral pulses, 2+ bilateral dorsal pedal pulse Neuro: Alert and oriented. No facial asymmetry. No focal deficit. Moves all extremities spontaneously. Musculoskeletal: Normal muscle tone without kyphosis Psych:  Responds to questions appropriately with a normal affect.    Assessment: 84 year old male with heart block essential hypertension mixed hyperlipidemia having acute non-ST elevation myocardial infarction  Plan: 1.  Heparin for further risk reduction cardiovascular event 2.  Aspirin and Plavix for myocardial infarction 3.  Beta-blocker angiotensin receptor blocker for hypertension control and risk reduction and further cardiovascular event 4.  High intensity cholesterol therapy 5.  Consider echocardiogram for LV systolic dysfunction valvular heart disease contributing to 6.  Ambulation and follow for improvements of symptoms and need for adjustments of medications or further intervention including the possibility of cardiac catheterization for which she family and patient understand all risks and benefits  Signed, Lamar BlinksBruce J Rosena Bartle M.D. [facility] AssociatesFACC [facility] 09/09/2017, 5:46 PM

## 2017-09-09 NOTE — Progress Notes (Signed)
Patient admitted to unit. Oriented to room, call bell, and staff. Bed in lowest position. Fall safety plan reviewed. Full assessment to Epic. Skin assessment verified with [facility]. Telemetry box verification with tele clerk- Box#: [id number]. Will continue to monitor.

## 2017-09-10 ENCOUNTER — Encounter: Payer: Self-pay | Admitting: Internal Medicine

## 2017-09-10 LAB — BASIC METABOLIC PANEL
ANION GAP: 8 (ref 5–15)
BUN: 17 mg/dL (ref 6–20)
CHLORIDE: 96 mmol/L — AB (ref 101–111)
CO2: 24 mmol/L (ref 22–32)
Calcium: 8 mg/dL — ABNORMAL LOW (ref 8.9–10.3)
Creatinine, Ser: 1.04 mg/dL (ref 0.61–1.24)
GFR calc Af Amer: >60 mL/min (ref >60)
GFR, EST NON AFRICAN AMERICAN: 60 mL/min — AB (ref >60)
GLUCOSE: 110 mg/dL — AB (ref 65–99)
POTASSIUM: 4.1 mmol/L (ref 3.5–5.1)
Sodium: 128 mmol/L — ABNORMAL LOW (ref 135–145)

## 2017-09-10 LAB — HEPARIN LEVEL (UNFRACTIONATED): Heparin Unfractionated: 0.75 IU/mL — ABNORMAL HIGH (ref 0.30–0.70)

## 2017-09-10 LAB — ECHOCARDIOGRAM COMPLETE
Height: 69 in
Weight: 3532.8 oz

## 2017-09-10 LAB — CBC
HCT: 37 % — ABNORMAL LOW (ref 40.0–52.0)
HEMOGLOBIN: 13 g/dL (ref 13.0–18.0)
MCH: 34.7 pg — ABNORMAL HIGH (ref 26.0–34.0)
MCHC: 35.1 g/dL (ref 32.0–36.0)
MCV: 99.1 fL (ref 80.0–100.0)
Platelets: 197 10*3/uL (ref 150–440)
RBC: 3.74 MIL/uL — AB (ref 4.40–5.90)
RDW: 13.6 % (ref 11.5–14.5)
WBC: 10.1 10*3/uL (ref 3.8–10.6)

## 2017-09-10 LAB — OSMOLALITY, URINE: Osmolality, Ur: 166 mOsm/kg — ABNORMAL LOW (ref 300–900)

## 2017-09-10 LAB — GLUCOSE, CAPILLARY: Glucose-Capillary: 106 mg/dL — ABNORMAL HIGH (ref 65–99)

## 2017-09-10 LAB — TSH: TSH: 4.526 u[IU]/mL — ABNORMAL HIGH (ref 0.350–4.500)

## 2017-09-10 MED ORDER — HEPARIN BOLUS VIA INFUSION
4000.0000 [IU] | Freq: Once | INTRAVENOUS | Status: AC
  Administered 2017-09-10: 4000 [IU] via INTRAVENOUS
  Filled 2017-09-10: qty 4000

## 2017-09-10 MED ORDER — HEPARIN (PORCINE) IN NACL 100-0.45 UNIT/ML-% IJ SOLN
1050.0000 [IU]/h | INTRAMUSCULAR | Status: DC
  Administered 2017-09-10: 1150 [IU]/h via INTRAVENOUS
  Administered 2017-09-11: 1050 [IU]/h via INTRAVENOUS
  Filled 2017-09-10 (×2): qty 250

## 2017-09-10 NOTE — Progress Notes (Signed)
[facility] Cardiology [facility] Encounter Note  Patient: Gary York / Admit Date: 09/09/2017 / Date of Encounter: 09/10/2017, 8:59 AM   Subjective: Patient feels better today.  No evidence of chest pain shortness of breath or other significant symptoms at this a.m.  Patient does have a slight elevation of troponin consistent with non-ST elevation myocardial infarction.  Discussion with the family and the patient suggests conservative therapy for his non-ST elevation myocardial function including dual antiplatelet therapy high intensity cholesterol therapy isosorbide and hypertension control. Echo cardiogram pending  Review of Systems: Positive for: Weakness Negative for: Vision change, hearing change, syncope, dizziness, nausea, vomiting,diarrhea, bloody stool, stomach pain, cough, congestion, diaphoresis, urinary frequency, urinary pain,skin lesions, skin rashes Others previously listed  Objective: Telemetry: Pacemaker Physical Exam: Blood pressure (!) 150/79, pulse 70, temperature 97.9 F (36.6 C), temperature source Oral, resp. rate 20, height 5\' 9"  (1.753 m), weight 220 lb 12.8 oz (100.2 kg), SpO2 96 %. Body mass index is 32.61 kg/m. General: Well developed, well nourished, in no acute distress. Head: Normocephalic, atraumatic, sclera non-icteric, no xanthomas, nares are without discharge. Neck: No apparent masses Lungs: Normal respirations with no wheezes, no rhonchi, no rales , few crackles   Heart: Regular rate and rhythm, normal S1 S2, no murmur, no rub, no gallop, PMI is normal size and placement, carotid upstroke normal without bruit, jugular venous pressure normal Abdomen: Soft, non-tender, non-distended with normoactive bowel sounds. No hepatosplenomegaly. Abdominal aorta is normal size without bruit Extremities: Trace edema, no clubbing, no cyanosis, no ulcers,  Peripheral: 2+ radial, 2+ femoral, 2+ dorsal pedal pulses Neuro: Alert and oriented. Moves all extremities  spontaneously. Psych:  Responds to questions appropriately with a normal affect.   Intake/Output Summary (Last 24 hours) at 09/10/2017 0859 Last data filed at 09/10/2017 0700 Gross per 24 hour  Intake 1163.75 ml  Output 2500 ml  Net -1336.25 ml    Inpatient Medications:  . aspirin EC  81 mg Oral Daily  . atorvastatin  20 mg Oral q1800  . benzonatate  100 mg Oral TID  . clopidogrel  75 mg Oral Daily  . diltiazem  180 mg Oral Daily  . doxazosin  2 mg Oral Daily  . enoxaparin (LOVENOX) injection  40 mg Subcutaneous q1800  . famotidine  20 mg Oral Daily  . isosorbide mononitrate  30 mg Oral Daily  . losartan  100 mg Oral Daily  . metoprolol tartrate  25 mg Oral BID   Infusions:  . sodium chloride 75 mL/hr at 09/10/17 0246    Labs: Recent Labs    09/09/17 1048  09/09/17 2143 09/10/17 0408  NA 123*   < > 127* 128*  K 4.5  --   --  4.1  CL 89*  --   --  96*  CO2 25  --   --  24  GLUCOSE 138*  --   --  110*  BUN 21*  --   --  17  CREATININE 1.13  --   --  1.04  CALCIUM 8.6*  --   --  8.0*   < > = values in this interval not displayed.   Recent Labs    09/09/17 1048  AST 33  ALT 41  ALKPHOS 43  BILITOT 0.6  PROT 7.3  ALBUMIN 4.5   Recent Labs    09/09/17 0927 09/10/17 0408  WBC 10.0 10.1  NEUTROABS 7.5*  --   HGB 13.8 13.0  HCT 39.9* 37.0*  MCV 98.9  99.1  PLT 189 197   Recent Labs    09/09/17 1048 09/09/17 1426 09/09/17 2143 09/10/17 0152  TROPONINI 0.11* 0.92* 1.22* 1.41*   Invalid input(s): POCBNP No results for input(s): HGBA1C in the last 72 hours.   Weights: Filed Weights   09/09/17 0923 09/09/17 1344  Weight: 200 lb (90.7 kg) 220 lb 12.8 oz (100.2 kg)     Radiology/Studies:  Dg Chest 2 View  Result Date: 09/09/2017 CLINICAL DATA:  Right chest pain EXAM: CHEST - 2 VIEW COMPARISON:  09/03/2017 FINDINGS: Right pacer remains in place, unchanged. Elevation of the right hemidiaphragm. Mild cardiomegaly. Right base atelectasis or scarring. No  confluent opacity on the left. No effusions or edema. No acute bony abnormality. IMPRESSION: Stable chronic elevation of the right hemidiaphragm with chronic right base atelectasis or scarring. Cardiomegaly. Electronically Signed   By: Charlett NoseKevin  Dover M.D.   On: 09/09/2017 10:06   Dg Chest 2 View  Result Date: 09/03/2017 CLINICAL DATA:  LEFT side anterior lower chest pain with cough for 1 day EXAM: CHEST - 2 VIEW COMPARISON:  08/26/2013 FINDINGS: RIGHT subclavian transvenous pacemaker with sequential leads projecting at RIGHT atrium and RIGHT ventricle, unchanged. Enlargement of cardiac silhouette. Atherosclerotic calcification aorta. Mediastinal contours and pulmonary vascularity otherwise normal. Increased elevation of RIGHT diaphragm with mild RIGHT basilar atelectasis. Minimal scarring LEFT base. Lungs otherwise clear. No pulmonary infiltrate, pleural effusion or pneumothorax. Bones demineralized. IMPRESSION: RIGHT basilar atelectasis and minimal LEFT basilar scarring. Enlargement of cardiac silhouette post pacemaker. No acute abnormalities. Electronically Signed   By: Ulyses SouthwardMark  Boles M.D.   On: 09/03/2017 09:37     Assessment and Recommendation  84 y.o. male with known essential hypertension mixed hyperlipidemia on appropriate medication management status post dual chamber pacemaker placement in the past with acute onset of chest pain with non-ST elevation myocardial infarction 1.  Continue dual antiplatelet therapy for myocardial infarction 2.  High intensity cholesterol therapy 3.  Hypertension control with angiotensin receptor blocker and beta-blocker 4.  Isosorbide for presumed coronary artery disease and stenosis to conservatively treat myocardial infarction in elderly male and defer cardiac catheterization at this time if able 5.  Ambulation and follow for improvements of symptoms of chest pain and follow for need for intervention and or other medication management 6.  Possible discharged home  later today or tomorrow depending on improvements as above  Signed, Arnoldo HookerBruce Monterio Bob M.D. FACC

## 2017-09-10 NOTE — Progress Notes (Signed)
Ambulate with patient in hallway, denies any chest p[ain or chest discomfort while ambulating, patient presently resting in the chair, family at bedside

## 2017-09-10 NOTE — Progress Notes (Signed)
[facility] [place] [facility]   PATIENT NAME: Gary York    MR#:  [id number]  DATE OF BIRTH:  21-Apr-1921  SUBJECTIVE:  CHIEF COMPLAINT:   Chief Complaint  Patient presents with  . Chest Pain   - admitted with chest pain and noted to have NSTEMI - chest pain free now, feels better, starting heparin drip  REVIEW OF SYSTEMS:  Review of Systems  Constitutional: Negative for chills, fever and malaise/fatigue.  HENT: Positive for hearing loss. Negative for congestion, ear discharge and nosebleeds.   Eyes: Negative for blurred vision and double vision.  Respiratory: Negative for cough, shortness of breath and wheezing.   Cardiovascular: Negative for chest pain and palpitations.  Gastrointestinal: Negative for abdominal pain, constipation, diarrhea, nausea and vomiting.  Genitourinary: Negative for dysuria.  Musculoskeletal: Negative for myalgias.  Neurological: Negative for dizziness, focal weakness, seizures, weakness and headaches.  Psychiatric/Behavioral: Negative for depression.    DRUG ALLERGIES:  No Known Allergies  VITALS:  Blood pressure (!) 150/79, pulse 70, temperature 97.9 F (36.6 C), temperature source Oral, resp. rate 20, height 5\' 9"  (1.753 m), weight 100.2 kg (220 lb 12.8 oz), SpO2 96 %.  PHYSICAL EXAMINATION:  Physical Exam  GENERAL:  84 y.o.-year-old elderly patient lying in the bed with no acute distress.  EYES: Pupils equal, round, reactive to light and accommodation. No scleral icterus. Extraocular muscles intact.  HEENT: Head atraumatic, normocephalic. Oropharynx and nasopharynx clear.  NECK:  Supple, no jugular venous distention. No thyroid enlargement, no tenderness.  LUNGS: Normal breath sounds bilaterally, no wheezing, rales,rhonchi or crepitation. No use of accessory muscles of respiration.  CARDIOVASCULAR: S1, S2 normal. No  rubs, or gallops. 2/6 systolic murmur present  ABDOMEN: Soft, nontender, nondistended. Bowel sounds  present. No organomegaly or mass.  EXTREMITIES: No pedal edema, cyanosis, or clubbing.  NEUROLOGIC: Cranial nerves II through XII are intact. Muscle strength 5/5 in all extremities. Sensation intact. Gait not checked.  PSYCHIATRIC: The patient is alert and oriented x 3.  SKIN: No obvious rash, lesion, or ulcer.    LABORATORY PANEL:   CBC Recent Labs  Lab 09/10/17 0408  WBC 10.1  HGB 13.0  HCT 37.0*  PLT 197   ------------------------------------------------------------------------------------------------------------------  Chemistries  Recent Labs  Lab 09/09/17 1048  09/10/17 0408  NA 123*   < > 128*  K 4.5  --  4.1  CL 89*  --  96*  CO2 25  --  24  GLUCOSE 138*  --  110*  BUN 21*  --  17  CREATININE 1.13  --  1.04  CALCIUM 8.6*  --  8.0*  AST 33  --   --   ALT 41  --   --   ALKPHOS 43  --   --   BILITOT 0.6  --   --    < > = values in this interval not displayed.   ------------------------------------------------------------------------------------------------------------------  Cardiac Enzymes Recent Labs  Lab 09/10/17 0152  TROPONINI 1.41*   ------------------------------------------------------------------------------------------------------------------  RADIOLOGY:  Dg Chest 2 View  Result Date: 09/09/2017 CLINICAL DATA:  Right chest pain EXAM: CHEST - 2 VIEW COMPARISON:  09/03/2017 FINDINGS: Right pacer remains in place, unchanged. Elevation of the right hemidiaphragm. Mild cardiomegaly. Right base atelectasis or scarring. No confluent opacity on the left. No effusions or edema. No acute bony abnormality. IMPRESSION: Stable chronic elevation of the right hemidiaphragm with chronic right base atelectasis or scarring. Cardiomegaly. Electronically Signed   By: Charlett NoseKevin  Dover  M.D.   On: 09/09/2017 10:06    EKG:   Orders placed or performed during the hospital encounter of 09/09/17  . ED EKG within 10 minutes  . ED EKG within 10 minutes  . EKG 12-Lead  . EKG  12-Lead  . EKG 12-Lead  . EKG 12-Lead  . EKG 12-Lead  . EKG 12-Lead  . EKG 12-lead  . EKG 12-Lead  . EKG 12-Lead    ASSESSMENT AND PLAN:   84 year old male with past medical history significant for hypertension, history of complete heart block status post dual-chamber pacemaker presents to hospital secondary to chest pain.  1. NSTEMI-appreciate cardiology consult. -No chest pain at this time - IV heparin started, conservative management at this time - on asa, plavix, statin, metoprolol and losartan - ECHO ordered - If chest pain recurs on ambulation/exertion- consider cardiac cath  2.  History of complete heart block-status post pacemaker -On Cardizem and metoprolol  3.  Hypertension-on losartan, metoprolol, Imdur and Cardizem  4. Hyponatremia- likely hypovolemic- IV fluids started with some improvement  5.  DVT prophylaxis-on heparin drip     All the records are reviewed and case discussed with Care Management/Social Workerr. Management plans discussed with the patient, family and they are in agreement.  CODE STATUS: Full Code  TOTAL TIME TAKING CARE OF THIS PATIENT: 37 minutes.   POSSIBLE D/C IN 2 DAYS, DEPENDING ON CLINICAL CONDITION.   Enid Baas M.D on 09/10/2017 at 9:15 AM  Between 7am to 6pm - Pager - [phone]  After 6pm go to [email or web address] - password [email or web address]  [facility]  Office  [phone]  CC: Primary care physician; Patient, No Pcp Per

## 2017-09-10 NOTE — Progress Notes (Signed)
Patient presently resting in the bed, family at bedside, heparin drip infusing at 11.5 ml/Hr, patient was instructed to call for help as needed, and also to call us for assistance to the bathroom .

## 2017-09-10 NOTE — Care Management Note (Signed)
Case Management Note  Patient Details  Name: Gary DeistJack R Risser MRN: [id number] Date of Birth: 04-Mar-1922   Subjective/Objective:     [facility] with the diagnosis of chest pain. Lives alone, but has 24/7 caregivers. Daughter is March RummageRebecca Crandall [phone]) Sees Dr. Dion Body[place]i  For cardiology care. No home health. No skilled nursing. No home oxygen, Cane, shower chair, and raised toilet seat in the home.No falls. Good appetite Family/friend will transport             Action/Plan: No follow-up needs identified at this time.   Expected Discharge Date:                  Expected Discharge Plan:     In-House Referral:     Discharge planning Services     Post Acute Care Choice:    Choice offered to:     DME Arranged:    DME Agency:     HH Arranged:    HH Agency:     Status of Service:     If discussed at [employer] Meetings, dates discussed:    Additional Comments:  Gwenette GreetBrenda S Hollyanne Schloesser, RN MSN CCM Care Management [phone] 09/10/2017, 12:57 PM

## 2017-09-10 NOTE — Progress Notes (Signed)
ANTICOAGULATION CONSULT NOTE - Initial Consult  Pharmacy Consult for Heparin drip Indication: chest pain/ACS  No Known Allergies  Patient Measurements: Height: 5\' 9"  (175.3 cm) Weight: 220 lb 12.8 oz (100.2 kg) IBW/kg (Calculated) : 70.7 Heparin Dosing Weight: 89.1 kg  Vital Signs: Temp: 97.9 F (36.6 C) (06/19 0810) Temp Source: Oral (06/19 0810) BP: 150/79 (06/19 0810) Pulse Rate: 70 (06/19 0810)  Labs: Recent Labs    09/09/17 0927  09/09/17 1048 09/09/17 1426 09/09/17 2143 09/10/17 0152 09/10/17 0408  HGB 13.8  --   --   --   --   --  13.0  HCT 39.9*  --   --   --   --   --  37.0*  PLT 189  --   --   --   --   --  197  CREATININE  --   --  1.13  --   --   --  1.04  TROPONINI  --    < > 0.11* 0.92* 1.22* 1.41*  --    < > = values in this interval not displayed.    Estimated Creatinine Clearance: 51.8 mL/min (by C-G formula based on SCr of 1.04 mg/dL).   Medical History: Past Medical History:  Diagnosis Date  . Complete heart block (HCC)   . Hypertension     Assessment: Patient is a 84yo male admitted for chest pain, troponins are trending up. Pharmacy consulted for Heparin infusion.  Goal of Therapy:  Heparin level 0.3-0.7 units/ml Monitor platelets by anticoagulation protocol: Yes   Plan:  Give 4000 units bolus x 1 Start heparin infusion at 1150 units/hr Check anti-Xa level in 8 hours and daily while on heparin Continue to monitor H&H and platelets  Clovia CuffLisa Gunnison Chahal, PharmD, BCPS 09/10/2017 12:21 PM

## 2017-09-10 NOTE — Plan of Care (Signed)
°  Problem: Clinical Measurements: °Goal: Cardiovascular complication will be avoided °Outcome: Progressing °  °Problem: Activity: °Goal: Risk for activity intolerance will decrease °Outcome: Progressing °  °

## 2017-09-10 NOTE — Progress Notes (Signed)
ANTICOAGULATION CONSULT NOTE - Initial Consult  Pharmacy Consult for Heparin drip Indication: chest pain/ACS  No Known Allergies  Patient Measurements: Height: 5\' 9"  (175.3 cm) Weight: 220 lb 12.8 oz (100.2 kg) IBW/kg (Calculated) : 70.7 Heparin Dosing Weight: 89.1 kg  Vital Signs: Temp: 97.7 F (36.5 C) (06/19 1651) Temp Source: Oral (06/19 0810) BP: 154/77 (06/19 1651) Pulse Rate: 73 (06/19 1651)  Labs: Recent Labs    09/09/17 0927  09/09/17 1048 09/09/17 1426 09/09/17 2143 09/10/17 0152 09/10/17 0408 09/10/17 1744  HGB 13.8  --   --   --   --   --  13.0  --   HCT 39.9*  --   --   --   --   --  37.0*  --   PLT 189  --   --   --   --   --  197  --   HEPARINUNFRC  --   --   --   --   --   --   --  0.75*  CREATININE  --   --  1.13  --   --   --  1.04  --   TROPONINI  --    < > 0.11* 0.92* 1.22* 1.41*  --   --    < > = values in this interval not displayed.    Estimated Creatinine Clearance: 51.8 mL/min (by C-G formula based on SCr of 1.04 mg/dL).   Medical History: Past Medical History:  Diagnosis Date  . Complete heart block (HCC)   . Hypertension     Assessment: Patient is a 84yo male admitted for chest pain, troponins are trending up. Pharmacy consulted for Heparin infusion.  Goal of Therapy:  Heparin level 0.3-0.7 units/ml Monitor platelets by anticoagulation protocol: Yes   Plan:  Give 4000 units bolus x 1 Start heparin infusion at 1150 units/hr Check anti-Xa level in 8 hours and daily while on heparin Continue to monitor H&H and platelets  6/19@1744 ; HL 0.74, slightly supra-therpeutic, will reduce heparin iv rate to 1050 units/hr and recheck in 8 hours per protocol.  Luan PullingGarrett Kennedy Bohanon, PharmD, MBA, BCGP Clinical Pharmacist  09/10/2017 7:20 PM

## 2017-09-11 ENCOUNTER — Inpatient Hospital Stay: Payer: Medicare Other

## 2017-09-11 ENCOUNTER — Other Ambulatory Visit: Payer: Self-pay

## 2017-09-11 LAB — BASIC METABOLIC PANEL
Anion gap: 6 (ref 5–15)
BUN: 16 mg/dL (ref 6–20)
CHLORIDE: 97 mmol/L — AB (ref 101–111)
CO2: 24 mmol/L (ref 22–32)
Calcium: 8.1 mg/dL — ABNORMAL LOW (ref 8.9–10.3)
Creatinine, Ser: 1.04 mg/dL (ref 0.61–1.24)
GFR calc non Af Amer: 60 mL/min — ABNORMAL LOW (ref >60)
GLUCOSE: 149 mg/dL — AB (ref 65–99)
Potassium: 4.5 mmol/L (ref 3.5–5.1)
Sodium: 127 mmol/L — ABNORMAL LOW (ref 135–145)

## 2017-09-11 LAB — TROPONIN I: Troponin I: 1.41 ng/mL (ref <0.03)

## 2017-09-11 LAB — SODIUM: Sodium: 127 mmol/L — ABNORMAL LOW (ref 135–145)

## 2017-09-11 LAB — CBC
HCT: 33.1 % — ABNORMAL LOW (ref 40.0–52.0)
Hemoglobin: 11.8 g/dL — ABNORMAL LOW (ref 13.0–18.0)
MCH: 35.2 pg — AB (ref 26.0–34.0)
MCHC: 35.5 g/dL (ref 32.0–36.0)
MCV: 99.3 fL (ref 80.0–100.0)
Platelets: 173 10*3/uL (ref 150–440)
RBC: 3.34 MIL/uL — AB (ref 4.40–5.90)
RDW: 13.7 % (ref 11.5–14.5)
WBC: 9.2 10*3/uL (ref 3.8–10.6)

## 2017-09-11 LAB — URIC ACID: URIC ACID, SERUM: 4.1 mg/dL — AB (ref 4.4–7.6)

## 2017-09-11 LAB — CORTISOL: CORTISOL PLASMA: 10.3 ug/dL

## 2017-09-11 LAB — OSMOLALITY, URINE: OSMOLALITY UR: 276 mosm/kg — AB (ref 300–900)

## 2017-09-11 LAB — HEPARIN LEVEL (UNFRACTIONATED)
Heparin Unfractionated: 0.52 IU/mL (ref 0.30–0.70)
Heparin Unfractionated: 0.49 IU/mL (ref 0.30–0.70)

## 2017-09-11 LAB — SODIUM, URINE, RANDOM: Sodium, Ur: 40 mmol/L

## 2017-09-11 MED ORDER — FUROSEMIDE 10 MG/ML IJ SOLN
20.0000 mg | Freq: Once | INTRAMUSCULAR | Status: AC
  Administered 2017-09-11: 20 mg via INTRAVENOUS
  Filled 2017-09-11: qty 2

## 2017-09-11 MED ORDER — ATORVASTATIN CALCIUM 20 MG PO TABS
80.0000 mg | ORAL_TABLET | Freq: Every day | ORAL | Status: DC
  Administered 2017-09-11 – 2017-09-12 (×2): 80 mg via ORAL
  Filled 2017-09-11 (×2): qty 4

## 2017-09-11 MED ORDER — ENOXAPARIN SODIUM 40 MG/0.4ML ~~LOC~~ SOLN
40.0000 mg | SUBCUTANEOUS | Status: DC
  Administered 2017-09-11 – 2017-09-12 (×2): 40 mg via SUBCUTANEOUS
  Filled 2017-09-11 (×2): qty 0.4

## 2017-09-11 MED ORDER — IPRATROPIUM-ALBUTEROL 0.5-2.5 (3) MG/3ML IN SOLN: 3.0000 mL | RESPIRATORY_TRACT | Status: DC | PRN

## 2017-09-11 NOTE — Progress Notes (Signed)
[facility] [place] [facility]   PATIENT NAME: Gary York    MR#:  [id number]  DATE OF BIRTH:  [birth year]  SUBJECTIVE:  CHIEF COMPLAINT:   Chief Complaint  Patient presents with  . Chest Pain   - sodium dropped to 127 again, dizzy but wheezing today - orthostatic drop in BP noted  REVIEW OF SYSTEMS:  Review of Systems  Constitutional: Negative for chills, fever and malaise/fatigue.  HENT: Positive for hearing loss. Negative for congestion, ear discharge and nosebleeds.   Eyes: Negative for blurred vision and double vision.  Respiratory: Negative for cough, shortness of breath and wheezing.   Cardiovascular: Negative for chest pain and palpitations.  Gastrointestinal: Negative for abdominal pain, constipation, diarrhea, nausea and vomiting.  Genitourinary: Negative for dysuria.  Musculoskeletal: Negative for myalgias.  Neurological: Negative for dizziness, focal weakness, seizures, weakness and headaches.  Psychiatric/Behavioral: Negative for depression.    DRUG ALLERGIES:  No Known Allergies  VITALS:  Blood pressure 129/73, pulse 73, temperature (!) 97.5 F (36.4 C), temperature source Oral, resp. rate 18, height 5\' 9"  (1.753 m), weight 100.2 kg (220 lb 12.8 oz), SpO2 96 %.  PHYSICAL EXAMINATION:  Physical Exam  GENERAL:  84 y.o.-year-old elderly patient lying in the bed with no acute distress.  EYES: Pupils equal, round, reactive to light and accommodation. No scleral icterus. Extraocular muscles intact.  HEENT: Head atraumatic, normocephalic. Oropharynx and nasopharynx clear.  NECK:  Supple, no jugular venous distention. No thyroid enlargement, no tenderness.  LUNGS: Normal breath sounds bilaterally, upper airway wheezing, no rales,rhonchi or crepitation. No use of accessory muscles of respiration.  CARDIOVASCULAR: S1, S2 normal. No  rubs, or gallops. 2/6 systolic murmur present  ABDOMEN: Soft, nontender, nondistended. Bowel sounds present. No  organomegaly or mass.  EXTREMITIES: No pedal edema, cyanosis, or clubbing.  NEUROLOGIC: Cranial nerves II through XII are intact. Muscle strength 5/5 in all extremities. Sensation intact. Gait not checked.  PSYCHIATRIC: The patient is alert and oriented x 3.  SKIN: No obvious rash, lesion, or ulcer.    LABORATORY PANEL:   CBC Recent Labs  Lab 09/11/17 0143  WBC 9.2  HGB 11.8*  HCT 33.1*  PLT 173   ------------------------------------------------------------------------------------------------------------------  Chemistries  Recent Labs  Lab 09/09/17 1048  09/11/17 0143  NA 123*   < > 127*  K 4.5   < > 4.5  CL 89*   < > 97*  CO2 25   < > 24  GLUCOSE 138*   < > 149*  BUN 21*   < > 16  CREATININE 1.13   < > 1.04  CALCIUM 8.6*   < > 8.1*  AST 33  --   --   ALT 41  --   --   ALKPHOS 43  --   --   BILITOT 0.6  --   --    < > = values in this interval not displayed.   ------------------------------------------------------------------------------------------------------------------  Cardiac Enzymes Recent Labs  Lab 09/10/17 0152  TROPONINI 1.41*   ------------------------------------------------------------------------------------------------------------------  RADIOLOGY:  No results found.  EKG:   Orders placed or performed during the hospital encounter of 09/09/17  . ED EKG within 10 minutes  . ED EKG within 10 minutes  . EKG 12-Lead  . EKG 12-Lead  . EKG 12-Lead  . EKG 12-Lead  . EKG 12-Lead  . EKG 12-Lead  . EKG 12-lead  . EKG 12-Lead  . EKG 12-Lead  . EKG 12-Lead  ASSESSMENT AND PLAN:   84 year old male with past medical history significant for hypertension, history of complete heart block status post dual-chamber pacemaker presents to hospital secondary to chest pain.  1. NSTEMI-appreciate cardiology consult. -No chest pain at this time - IV heparin, conservative management at this time - on asa, plavix, statin, metoprolol and losartan -  ECHO with normal ejection fraction, no wall motion abnormalities noted. -No further chest pain on exertion.  2.  History of complete heart block-status post pacemaker -On Cardizem and metoprolol  3.  Hypertension-on losartan, metoprolol, Imdur and Cardizem  4. Hyponatremia- likely hypovolemic- received IV fluids but dropped again today, fluids discontinued and a dose of lasix given as he was wheezing today - but orthostatic drop noted.  Repeat sodium this morning.  If consistently low, consider fluid bolus nephrology consult  5.  DVT prophylaxis-on heparin drip   Physical therapy recommended no further needs at discharge.   All the records are reviewed and case discussed with Care Management/Social Workerr. Management plans discussed with the patient, family and they are in agreement.  CODE STATUS: Full Code  TOTAL TIME TAKING CARE OF THIS PATIENT: 37 minutes.   POSSIBLE D/C IN 1-2 DAYS, DEPENDING ON CLINICAL CONDITION.   Enid BaasKALISETTI,Tekeisha Hakim M.D on 09/11/2017 at 10:16 AM  Between 7am to 6pm - Pager - [phone]  After 6pm go to [email or web address] - password [email or web address]  [facility]  Office  [phone]  CC: Primary care physician; Patient, No Pcp Per

## 2017-09-11 NOTE — Progress Notes (Signed)
Cardiovascular and Pulmonary Nurse Navigator Note:  Rounded on patient.  Patient asleep with audible wheezing.  Personal caregiver at patient's bedside.    Personal Caregiver indicated that patient's daughter will return around 12 noon.    This RN will follow-up at that time.    Army Meliaiane Wright, RN, BSN, [facility] Cardiovascular and Pulmonary Nurse Navigator

## 2017-09-11 NOTE — Consult Note (Signed)
[facility] CONSULT NOTE    Date: 09/11/2017                  Patient Name:  ISSAAC York  MRN: [id number]  DOB: 03-27-1921  Age / Sex: 84 y.o., male         PCP: Patient, No Pcp Per                 Service Requesting Consult: Hospitalist                 Reason for Consult: Hyponatremia            History of Present Illness: Patient is a 84 y.o. male with a PMHx of hypertension, history of pacemaker placement, chronic cough, remote tobacco abuse who was admitted to [facility] on 09/09/2017 for evaluation of chest pain.  He was evaluated by cardiology for this.  He was found to have myocardial infarction.  The patient's wife passed away relatively recently and patient has been depressed in this regard.  We are asked to see the patient for hyponatremia.  He has had low-grade hyponatremia for some time however this appears to have worsened recently.  Upon admission serum sodium was 123.  He was initially provided with some IV fluid hydration but later became short of breath and was given Lasix.  Serum sodium currently 127.  CT chest was performed which demonstrated coronary atherosclerosis and bronchitic change.  No pulmonary nodules were noted.   Medications: Outpatient medications: Medications Prior to Admission  Medication Sig Dispense Refill Last Dose  . benzonatate (TESSALON PERLES) 100 MG capsule Take 1 capsule (100 mg total) by mouth 3 (three) times daily as needed for up to 10 days for cough. 20 capsule 0 PRN at PRN  . diltiazem (DILTIAZEM CD) 180 MG 24 hr capsule Take 180 mg by mouth daily.   Unknown at Unknown  . doxazosin (CARDURA) 2 MG tablet Take 2 mg by mouth daily.   Unknown at Unknown  . guaiFENesin-codeine 100-10 MG/5ML syrup Take 5 mLs by mouth every 6 (six) hours as needed for cough. 120 mL 0 PRN at PRN  . losartan (COZAAR) 100 MG tablet Take 100 mg by mouth daily.   Unknown at Unknown    Current medications: Current Facility-Administered Medications   Medication Dose Route Frequency Provider Last Rate Last Dose  . acetaminophen (TYLENOL) tablet 650 mg  650 mg Oral Q6H PRN Gouru, Aruna, MD       Or  . acetaminophen (TYLENOL) suppository 650 mg  650 mg Rectal Q6H PRN Gouru, Aruna, MD      . acetaminophen (TYLENOL) tablet 650 mg  650 mg Oral Q4H PRN Gouru, Aruna, MD      . aspirin EC tablet 81 mg  81 mg Oral Daily Gouru, Aruna, MD   81 mg at 09/11/17 0859  . atorvastatin (LIPITOR) tablet 80 mg  80 mg Oral q1800 Enid Baas, MD      . benzonatate (TESSALON) capsule 100 mg  100 mg Oral TID Gouru, Aruna, MD   100 mg at 09/11/17 0900  . clopidogrel (PLAVIX) tablet 75 mg  75 mg Oral Daily Lamar Blinks, MD   75 mg at 09/11/17 0859  . diltiazem (CARDIZEM CD) 24 hr capsule 180 mg  180 mg Oral Daily Gouru, Aruna, MD   180 mg at 09/11/17 0859  . doxazosin (CARDURA) tablet 2 mg  2 mg Oral Daily Gouru, Aruna, MD   2 mg  at 09/11/17 0900  . enoxaparin (LOVENOX) injection 40 mg  40 mg Subcutaneous Q24H Enid BaasKalisetti, Radhika, MD      . famotidine (PEPCID) tablet 20 mg  20 mg Oral Daily Gouru, Aruna, MD   20 mg at 09/11/17 0859  . guaiFENesin-codeine 100-10 MG/5ML solution 5 mL  5 mL Oral Q6H PRN Gouru, Aruna, MD      . ipratropium-albuterol (DUONEB) 0.5-2.5 (3) MG/3ML nebulizer solution 3 mL  3 mL Nebulization Q4H PRN Enid BaasKalisetti, Radhika, MD      . isosorbide mononitrate (IMDUR) 24 hr tablet 30 mg  30 mg Oral Daily Lamar BlinksKowalski, Bruce J, MD   30 mg at 09/11/17 0859  . losartan (COZAAR) tablet 100 mg  100 mg Oral Daily Gouru, Aruna, MD   100 mg at 09/11/17 0859  . metoprolol tartrate (LOPRESSOR) tablet 25 mg  25 mg Oral BID Lamar BlinksKowalski, Bruce J, MD   25 mg at 09/11/17 0859  . nitroGLYCERIN (NITROSTAT) SL tablet 0.4 mg  0.4 mg Sublingual Q5 Min x 3 PRN Gouru, Aruna, MD      . ondansetron (ZOFRAN) tablet 4 mg  4 mg Oral Q6H PRN Gouru, Aruna, MD       Or  . ondansetron (ZOFRAN) injection 4 mg  4 mg Intravenous Q6H PRN Gouru, Aruna, MD      . ondansetron  (ZOFRAN) injection 4 mg  4 mg Intravenous Q6H PRN Gouru, Aruna, MD      . senna-docusate (Senokot-S) tablet 1 tablet  1 tablet Oral QHS PRN Gouru, Aruna, MD          Allergies: No Known Allergies    Past Medical History: Past Medical History:  Diagnosis Date  . Complete heart block (HCC)   . Hypertension      Past Surgical History: Past Surgical History:  Procedure Laterality Date  . PACEMAKER IMPLANT       Family History: No family history of end-stage renal disease  Social History: Social History   Socioeconomic History  . Marital status: Married    Spouse name: Not on file  . Number of children: Not on file  . Years of education: Not on file  . Highest education level: Not on file  Occupational History  . Not on file  Social Needs  . Financial resource strain: Not on file  . Food insecurity:    Worry: Not on file    Inability: Not on file  . Transportation needs:    Medical: Not on file    Non-medical: Not on file  Tobacco Use  . Smoking status: Former Smoker    Packs/day: 1.00    Years: 20.00    Pack years: 20.00    Types: Cigarettes    Last attempt to quit: 1990    Years since quitting: 29.4  . Smokeless tobacco: Never Used  Substance and Sexual Activity  . Alcohol use: Not Currently  . Drug use: Never  . Sexual activity: Not on file  Lifestyle  . Physical activity:    Days per week: Not on file    Minutes per session: Not on file  . Stress: Not on file  Relationships  . Social connections:    Talks on phone: Not on file    Gets together: Not on file    Attends religious service: Not on file    Active member of club or organization: Not on file    Attends meetings of clubs or organizations: Not on file    Relationship status:  Not on file  . Intimate partner violence:    Fear of current or ex partner: Not on file    Emotionally abused: Not on file    Physically abused: Not on file    Forced sexual activity: Not on file  Other Topics  Concern  . Not on file  Social History Narrative  . Not on file     Review of Systems: Review of Systems  Constitutional: Positive for malaise/fatigue. Negative for chills and fever.  HENT: Negative for congestion, hearing loss and tinnitus.   Eyes: Negative for blurred vision and double vision.  Respiratory: Positive for cough and shortness of breath.   Cardiovascular: Positive for chest pain. Negative for leg swelling and PND.  Gastrointestinal: Negative for heartburn, nausea and vomiting.  Genitourinary: Negative for dysuria, frequency and urgency.  Musculoskeletal: Negative for joint pain and myalgias.  Skin: Negative for rash.  Neurological: Negative for dizziness and weakness.  Endo/Heme/Allergies: Negative for polydipsia. Does not bruise/bleed easily.  Psychiatric/Behavioral: Positive for depression. The patient is not nervous/anxious.      Vital Signs: Blood pressure 129/73, pulse 73, temperature (!) 97.5 F (36.4 C), temperature source Oral, resp. rate 18, height 5\' 9"  (1.753 m), weight 100.2 kg (220 lb 12.8 oz), SpO2 96 %.  Weight trends: Filed Weights   09/09/17 0923 09/09/17 1344  Weight: 90.7 kg (200 lb) 100.2 kg (220 lb 12.8 oz)    Physical Exam: General: NAD,   Head: Normocephalic, atraumatic.  Eyes: Anicteric, EOMI  Nose: Mucous membranes moist, not inflammed, nonerythematous.  Throat: Oropharynx nonerythematous, no exudate appreciated.   Neck: Supple, trachea midline.  Lungs:  Normal respiratory effort. Clear to auscultation BL without crackles or wheezes.  Heart: RRR. S1 and S2 normal without gallop, murmur, or rubs.  Abdomen:  BS normoactive. Soft, Nondistended, non-tender.  No masses or organomegaly.  Extremities: No pretibial edema.  Neurologic: A&O X3, Motor strength is 5/5 in the all 4 extremities  Skin: No visible rashes, scars.    Lab results: Basic Metabolic Panel: Recent Labs  Lab 09/09/17 1048  09/10/17 0408 09/11/17 0143  09/11/17 1100  NA 123*   < > 128* 127* 127*  K 4.5  --  4.1 4.5  --   CL 89*  --  96* 97*  --   CO2 25  --  24 24  --   GLUCOSE 138*  --  110* 149*  --   BUN 21*  --  17 16  --   CREATININE 1.13  --  1.04 1.04  --   CALCIUM 8.6*  --  8.0* 8.1*  --    < > = values in this interval not displayed.    Liver Function Tests: Recent Labs  Lab 09/09/17 1048  AST 33  ALT 41  ALKPHOS 43  BILITOT 0.6  PROT 7.3  ALBUMIN 4.5   No results for input(s): LIPASE, AMYLASE in the last 168 hours. No results for input(s): AMMONIA in the last 168 hours.  CBC: Recent Labs  Lab 09/09/17 0927 09/10/17 0408 09/11/17 0143  WBC 10.0 10.1 9.2  NEUTROABS 7.5*  --   --   HGB 13.8 13.0 11.8*  HCT 39.9* 37.0* 33.1*  MCV 98.9 99.1 99.3  PLT 189 197 173    Cardiac Enzymes: Recent Labs  Lab 09/09/17 1048 09/09/17 1426 09/09/17 2143 09/10/17 0152  TROPONINI 0.11* 0.92* 1.22* 1.41*    BNP: Invalid input(s): POCBNP  CBG: Recent Labs  Lab 09/10/17 [phone]  GLUCAP 106*    Microbiology: No results found for this or any previous visit.  Coagulation Studies: No results for input(s): LABPROT, INR in the last 72 hours.  Urinalysis: No results for input(s): COLORURINE, LABSPEC, PHURINE, GLUCOSEU, HGBUR, BILIRUBINUR, KETONESUR, PROTEINUR, UROBILINOGEN, NITRITE, LEUKOCYTESUR in the last 72 hours.  Invalid input(s): APPERANCEUR    Imaging: Ct Chest Wo Contrast  Result Date: 09/11/2017 CLINICAL DATA:  84 year old male with past medical history significant for hypertension, history of complete heart block status post dual-chamber pacemaker presents to hospital secondary to chest pain. Chest xray done 6-18. Pt currently wheezing. EXAM: CT CHEST WITHOUT CONTRAST TECHNIQUE: Multidetector CT imaging of the chest was performed following the standard protocol without IV contrast. COMPARISON:  09/09/2017 FINDINGS: Cardiovascular: There is trace pericardial effusion. Mild cardiomegaly. Pacemaker leads  are present in the RIGHT atrium and RIGHT ventricle. There is dense atherosclerotic calcification of the coronary arteries. The thoracic aorta is partially calcified but not aneurysmal. Mediastinum/Nodes: The visualized portion of the thyroid gland has a normal appearance. No mediastinal, hilar, or axillary adenopathy. Esophagus is normal in appearance. Lungs/Pleura: Study quality is degraded by motion artifact.Minimal atelectasis at both lung bases. No suspicious pulmonary nodules. No pleural effusions or consolidations. There is central peribronchial thickening. Upper Abdomen: The gallbladder is present. A 12 millimeter low-attenuation lesion is identified at the dome of the liver, nonspecific and not further characterized. There scattered calcified granulomata within the liver. Low-attenuation lesions in the RIGHT kidney are consistent with cysts. There is dense atherosclerotic calcification of the abdominal aorta. Musculoskeletal: Degenerative changes are seen in the thoracic spine. No suspicious lytic or blastic lesions are identified. IMPRESSION: 1. Mild cardiomegaly and coronary artery disease. 2. Bronchitic changes. 3.  Aortic atherosclerosis.  (ICD10-I70.0) 4. RIGHT renal cyst. 5. 12 millimeter low-attenuation lesion within the dome of the RIGHT hepatic lobe is consistent with a benign cyst. Electronically Signed   By: Norva Pavlov M.D.   On: 09/11/2017 12:50      Assessment & Plan: Pt is a 84 y.o. male with a PMHx of hypertension, history of pacemaker placement, chronic cough, remote tobacco abuse who was admitted to [facility] on 09/09/2017 for evaluation of chest pain.   1.  Hyponatremia.  2.  Acute myocardial infarction.  3.  Hypertension.   Plan: We are asked to see the patient for evaluation management of hyponatremia.  He does not appear to be grossly volume overloaded at the moment though on CT scan there was mild cardiomegaly.  Patient was initially provided with 0.9 normal saline however  serum sodium appears to have stabilized in mid 120s.  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  There was bronchitic change within the lung which could potentially be leading to SIADH.  We will recheck serum osmolality, urine osmolality, cortisol, and uric acid.  TSH was already checked and was within normal limits.  We may consider providing the patient tolvaptan but we will await further lab testing.  Further plan as patient progresses.  Thanks for consultation.

## 2017-09-11 NOTE — Progress Notes (Signed)
[facility] Encounter Note  Patient: DOMINICO ROD / Admit Date: 09/09/2017 / Date of Encounter: 09/11/2017, 8:19 AM   Subjective: Patient feels better today.  No evidence of chest pain shortness of breath or other significant symptoms at this a.m.  Patient does have a slight elevation of troponin consistent with non-ST elevation myocardial infarction.  Discussion with the family and the patient suggests conservative therapy for his non-ST elevation myocardial function including dual antiplatelet therapy high intensity cholesterol therapy isosorbide and hypertension control. Echo cardiogram showing no evidence of significant LV systolic dysfunction or valvular heart disease contributing to above.  Ejection fraction is 50% He does have some diffuse wheezing today of unknown etiology  Review of Systems: Positive for: Weakness and wheezing Negative for: Vision change, hearing change, syncope, dizziness, nausea, vomiting,diarrhea, bloody stool, stomach pain, cough, congestion, diaphoresis, urinary frequency, urinary pain,skin lesions, skin rashes Others previously listed  Objective: Telemetry: Pacemaker Physical Exam: Blood pressure 129/73, pulse 73, temperature (!) 97.5 F (36.4 C), temperature source Oral, resp. rate 18, height 5\' 9"  (1.753 m), weight 220 lb 12.8 oz (100.2 kg), SpO2 96 %. Body mass index is 32.61 kg/m. General: Well developed, well nourished, in no acute distress. Head: Normocephalic, atraumatic, sclera non-icteric, no xanthomas, nares are without discharge. Neck: No apparent masses Lungs: Normal respirations with diffuse wheezes, no rhonchi, no rales , few crackles   Heart: Regular rate and rhythm, normal S1 S2, no murmur, no rub, no gallop, PMI is normal size and placement, carotid upstroke normal without bruit, jugular venous pressure normal Abdomen: Soft, non-tender, non-distended with normoactive bowel sounds. No hepatosplenomegaly. Abdominal aorta  is normal size without bruit Extremities: Trace edema, no clubbing, no cyanosis, no ulcers,  Peripheral: 2+ radial, 2+ femoral, 2+ dorsal pedal pulses Neuro: Alert and oriented. Moves all extremities spontaneously. Psych:  Responds to questions appropriately with a normal affect.   Intake/Output Summary (Last 24 hours) at 09/11/2017 0819 Last data filed at 09/11/2017 0600 Gross per 24 hour  Intake 943.5 ml  Output 850 ml  Net 93.5 ml    Inpatient Medications:  . aspirin EC  81 mg Oral Daily  . atorvastatin  20 mg Oral q1800  . benzonatate  100 mg Oral TID  . clopidogrel  75 mg Oral Daily  . diltiazem  180 mg Oral Daily  . doxazosin  2 mg Oral Daily  . famotidine  20 mg Oral Daily  . furosemide  20 mg Intravenous Once  . isosorbide mononitrate  30 mg Oral Daily  . losartan  100 mg Oral Daily  . metoprolol tartrate  25 mg Oral BID   Infusions:  . heparin 1,050 Units/hr (09/11/17 0645)    Labs: Recent Labs    09/10/17 0408 09/11/17 0143  NA 128* 127*  K 4.1 4.5  CL 96* 97*  CO2 24 24  GLUCOSE 110* 149*  BUN 17 16  CREATININE 1.04 1.04  CALCIUM 8.0* 8.1*   Recent Labs    09/09/17 1048  AST 33  ALT 41  ALKPHOS 43  BILITOT 0.6  PROT 7.3  ALBUMIN 4.5   Recent Labs    09/09/17 0927 09/10/17 0408 09/11/17 0143  WBC 10.0 10.1 9.2  NEUTROABS 7.5*  --   --   HGB 13.8 13.0 11.8*  HCT 39.9* 37.0* 33.1*  MCV 98.9 99.1 99.3  PLT 189 197 173   Recent Labs    09/09/17 1048 09/09/17 1426 09/09/17 2143 09/10/17 0152  TROPONINI 0.11* 0.92*  1.22* 1.41*   Invalid input(s): POCBNP No results for input(s): HGBA1C in the last 72 hours.   Weights: Filed Weights   09/09/17 0923 09/09/17 1344  Weight: 200 lb (90.7 kg) 220 lb 12.8 oz (100.2 kg)     Radiology/Studies:  Dg Chest 2 View  Result Date: 09/09/2017 CLINICAL DATA:  Right chest pain EXAM: CHEST - 2 VIEW COMPARISON:  09/03/2017 FINDINGS: Right pacer remains in place, unchanged. Elevation of the right  hemidiaphragm. Mild cardiomegaly. Right base atelectasis or scarring. No confluent opacity on the left. No effusions or edema. No acute bony abnormality. IMPRESSION: Stable chronic elevation of the right hemidiaphragm with chronic right base atelectasis or scarring. Cardiomegaly. Electronically Signed   By: Charlett NoseKevin  Dover M.D.   On: 09/09/2017 10:06   Dg Chest 2 View  Result Date: 09/03/2017 CLINICAL DATA:  LEFT side anterior lower chest pain with cough for 1 day EXAM: CHEST - 2 VIEW COMPARISON:  08/26/2013 FINDINGS: RIGHT subclavian transvenous pacemaker with sequential leads projecting at RIGHT atrium and RIGHT ventricle, unchanged. Enlargement of cardiac silhouette. Atherosclerotic calcification aorta. Mediastinal contours and pulmonary vascularity otherwise normal. Increased elevation of RIGHT diaphragm with mild RIGHT basilar atelectasis. Minimal scarring LEFT base. Lungs otherwise clear. No pulmonary infiltrate, pleural effusion or pneumothorax. Bones demineralized. IMPRESSION: RIGHT basilar atelectasis and minimal LEFT basilar scarring. Enlargement of cardiac silhouette post pacemaker. No acute abnormalities. Electronically Signed   By: Ulyses SouthwardMark  Boles M.D.   On: 09/03/2017 09:37     Assessment and Recommendation  84 y.o. male with known essential hypertension mixed hyperlipidemia on appropriate medication management status post dual chamber pacemaker placement in the past with acute onset of chest pain with non-ST elevation myocardial infarction 1.  Continue dual antiplatelet therapy for myocardial infarction 2.  High intensity cholesterol therapy 3.  Hypertension control with angiotensin receptor blocker and beta-blocker 4.  Isosorbide for presumed coronary artery disease and stenosis to conservatively treat myocardial infarction in elderly male and defer cardiac catheterization at this time if able 5.  Ambulation and follow for improvements of symptoms of chest pain and follow for need for  intervention and or other medication management 6.  Other evaluation and treatment options of wheezing and concerns for pulmonary issues at this time 7.  No further cardiac intervention at this time due to no further episodes of chest discomfort or congestive heart failure or true angina with the possibility of discharged home from cardiac standpoint with follow-up next week  Signed, Arnoldo HookerBruce Keene Gilkey M.D. FACC

## 2017-09-11 NOTE — Progress Notes (Signed)
ANTICOAGULATION CONSULT NOTE - Initial Consult  Pharmacy Consult for Heparin drip Indication: chest pain/ACS  No Known Allergies  Patient Measurements: Height: 5\' 9"  (175.3 cm) Weight: 220 lb 12.8 oz (100.2 kg) IBW/kg (Calculated) : 70.7 Heparin Dosing Weight: 89.1 kg  Vital Signs: Temp: 97.8 F (36.6 C) (06/19 1926) Temp Source: Oral (06/19 1926) BP: 113/77 (06/19 1926) Pulse Rate: 76 (06/19 1926)  Labs: Recent Labs    09/09/17 0927  09/09/17 1048 09/09/17 1426 09/09/17 2143 09/10/17 0152 09/10/17 0408 09/10/17 1744 09/11/17 0143  HGB 13.8  --   --   --   --   --  13.0  --  11.8*  HCT 39.9*  --   --   --   --   --  37.0*  --  33.1*  PLT 189  --   --   --   --   --  197  --  173  HEPARINUNFRC  --   --   --   --   --   --   --  0.75* 0.52  CREATININE  --   --  1.13  --   --   --  1.04  --  1.04  TROPONINI  --    < > 0.11* 0.92* 1.22* 1.41*  --   --   --    < > = values in this interval not displayed.    Estimated Creatinine Clearance: 51.8 mL/min (by C-G formula based on SCr of 1.04 mg/dL).   Medical History: Past Medical History:  Diagnosis Date  . Complete heart block (HCC)   . Hypertension     Assessment: Patient is a 84yo male admitted for chest pain, troponins are trending up. Pharmacy consulted for Heparin infusion.  Goal of Therapy:  Heparin level 0.3-0.7 units/ml Monitor platelets by anticoagulation protocol: Yes   Plan:  06/20 @ 0200 HL 0.54 therapeutic. Will continue rate of 1050 units/hr and will recheck HL @ 1000. Hgb down by 1 unit will continue to monitor.  Thomasene Rippleavid Milanya Sunderland, PharmD, BCPS Clinical Pharmacist 09/11/2017

## 2017-09-11 NOTE — Evaluation (Signed)
Physical Therapy Evaluation Patient Details Name: Gary York MRN: [id number] DOB: 10-Nov-1921 Today's Date: 09/11/2017   History of Present Illness  Pt is a 84 y/o M who presented with chest pain. Pt with elevation of troponin consistent with NSTEMI, per Cardiology decision has been made for conservative therapy and Cardiologist recommended to ambulate the pt and assess for chest pain.  PT order received.  Pt's PMH includes pacemaker placement, complete heart block.      Clinical Impression  Pt admitted with above diagnosis. Pt currently with functional limitations due to the deficits listed below (see PT Problem List). Mr. Topper was very pleasant and agreeable to therapy.  PLOF and home layout received from daughter as pt with h/o cognitive/memory deficits.  The pt denies chest pain throughout session but does report dizziness in standing with +orthostatics (see general comments below for BP readings, RN notified).  Pt wheezing, even with light activity (sit>stand), but daughter reports this is his baseline for >20 years with no diagnosis despite seeing multiple doctors.  Pt was steady when ambulating using SPC with no signs of instability.  Pt independent with ADLs at baseline.  Will follow pt acutely during hospital stay but do not anticipate pt requiring PT at d/c given his current mobility status and his baseline level of mobility.    Follow Up Recommendations No PT follow up    Equipment Recommendations  None recommended by PT    Recommendations for Other Services       Precautions / Restrictions Precautions Precautions: Fall;Other (comment) Precaution Comments: Monitor HR, +orthostatic Restrictions Weight Bearing Restrictions: No      Mobility  Bed Mobility               General bed mobility comments: Pt sitting in chair at start and end of session  Transfers Overall transfer level: Needs assistance Equipment used: Straight cane Transfers: Sit to/from Stand Sit to  Stand: Supervision         General transfer comment: Supervision for safety as pt reports dizziness with sit>stand.  No signs of instability.  Pt transfers with safe technique. See general comments below for BP readings.    Ambulation/Gait Ambulation/Gait assistance: Supervision Gait Distance (Feet): 50 Feet Assistive device: Straight cane Gait Pattern/deviations: WFL(Within Functional Limits)     General Gait Details: No gait abnormalities noted.  Pt denies chest pain while ambulating and throughout session.  HR in upper 70s, up from low 70s at rest.  SpO2 remains at or above 94% on RA although pt wheezing (daughter reports this is his baseline for >20 years).   Stairs            Wheelchair Mobility    Modified Rankin (Stroke Patients Only)       Balance Overall balance assessment: Modified Independent                                           Pertinent Vitals/Pain Pain Assessment: No/denies pain(pt consistently denying chest pain throughout session)    Home Living Family/patient expects to be discharged to:: Private residence Living Arrangements: (caretaker) Available Help at Discharge: Personal care attendant;Available 24 hours/day Type of Home: House         Home Equipment: Walker - 2 wheels;Cane - single point;Bedside commode;Shower seat;Grab bars - tub/shower;Wheelchair - manual      Prior Function Level of Independence: Independent with assistive  device(s)         Comments: Pt ambulate with SPC at all times.  No falls in the past 6 months.  Caregivers 24/7 to assist with cleaning and to provide supervision as pt with memory/cognition deficits.  Pt independent with showering, dressing.      Hand Dominance        Extremity/Trunk Assessment   Upper Extremity Assessment Upper Extremity Assessment: (BUE strength grossly 4/5)    Lower Extremity Assessment Lower Extremity Assessment: (BLE strength grossly 4/5)        Communication   Communication: No difficulties  Cognition Arousal/Alertness: Awake/alert Behavior During Therapy: Flat affect;WFL for tasks assessed/performed(daughter reports pt with flat affect as personality) Overall Cognitive Status: History of cognitive impairments - at baseline                                        General Comments General comments (skin integrity, edema, etc.): BP in sitting 165/76, in standing 138/109, standing/moving to sitting due to dizziness 156/139, pt sat and moved BP cuff to L arm to retake BP due to high reading with BP reading of 159/84.  RN notified of BP readings and to page Dr. Nemiah CommanderKalisetti.     Exercises     Assessment/Plan    PT Assessment Patient needs continued PT services  PT Problem List Decreased strength;Decreased balance       PT Treatment Interventions DME instruction;Gait training;Stair training;Functional mobility training;Therapeutic activities;Therapeutic exercise;Balance training;Neuromuscular re-education;Patient/family education;Cognitive remediation    PT Goals (Current goals can be found in the Care Plan section)  Acute Rehab PT Goals Patient Stated Goal: to return home PT Goal Formulation: With patient Time For Goal Achievement: 09/25/17 Potential to Achieve Goals: Good    Frequency Min 2X/week   Barriers to discharge        Co-evaluation               AM-PAC PT "6 Clicks" Daily Activity  Outcome Measure Difficulty turning over in bed (including adjusting bedclothes, sheets and blankets)?: None Difficulty moving from lying on back to sitting on the side of the bed? : None Difficulty sitting down on and standing up from a chair with arms (e.g., wheelchair, bedside commode, etc,.)?: A Little Help needed moving to and from a bed to chair (including a wheelchair)?: A Little Help needed walking in hospital room?: A Little Help needed climbing 3-5 steps with a railing? : A Little 6 Click Score:  20    End of Session Equipment Utilized During Treatment: Gait belt Activity Tolerance: Treatment limited secondary to medical complications (Comment)(BP, dizziness) Patient left: in chair;with call bell/phone within reach;with chair alarm set;with family/visitor present Nurse Communication: Mobility status;Other (comment)(BP readings and pt's dizziness) [name]    Time: [id number] PT Time Calculation (min) (ACUTE ONLY): 24 min   Charges:   PT Evaluation $PT Eval Moderate Complexity: 1 Mod PT Treatments $Therapeutic Activity: 8-22 mins   PT G Codes:        Encarnacion ChuAshley Abashian PT, DPT 09/11/2017, 9:44 AM

## 2017-09-11 NOTE — Progress Notes (Addendum)
Cardiovascular and Pulmonary Nurse Navigator Note:  84 year old male with PMHx of HTN, CHB s/p dual chamber pacemaker who presents to hospital secondary to chest pain.    Active Problem List this admission: 1. NSTEMI  - Patient remains on heparin gtt.  Patient on ASA, plavix, statin, metoprolol, losartan, Imdur. Echo with normal ejection fraction  2. History of complete heart block - s/p pacemaker (patient on Cardizem and Metoprolol) 3. HTN - Patient on losartan, metoprolol, Imdur, and cardizem.   4. Hyponatremia - Nephrology consult.  5. DVT prophylaxis - Patient on heparin drip.    Patient lying in bed with HOB elevated.  Noted audible wheezing.   Patient's daughter, son, and daughter-in-law at bedside.  Family informed this RN that patient lost his wife of 71 years 10 days ago.    Explained to patient and family that patient had indeed had a heart attack, which is currently being medically managed.     ? Discussed modifiable risk factors including controlling blood pressure, cholesterol, and blood sugar; following heart healthy diet; maintaining healthy weight; exercise; and smoking cessation, if applicable.   ? Discussed cardiac medications including rationale for taking, mechanisms of action, and side effects. Stressed the importance of taking medications as prescribed.  ? Discussed emergency plan for heart attack symptoms - Call 911 if having cardiac symptoms / chest pain.  ? Heart healthy diet of low sodium, low fat, low cholesterol heart healthy diet discussed.    ? Smoking Cessation - Patient is a FORMER smoker.   ? Exercise - Patient evaluated by Physical Therapy today who recommended no PT follow-up.  Per patient's family and personal caregiver, patient walks around his home and sometimes he walks outside. Informed patient / family  that his cardiologist has referred him to outpatient Cardiac Rehab and explained to family for anyone who has an MI it is best practice for one to be  referred to Cardiac Rehab. Daughter replied, "It is doubtful that he will participate in this program."    Dr. Cherylann RatelLateef came in to evaluate patient at this time.    Will follow-up with patient/family while patient hospitalized.   ?  Army Meliaiane Wright, RN, BSN, [facility]  [facility]  Cardiovascular & Pulmonary Nurse Navigator  Direct Line: [phone]  Department Phone #: [phone] Fax: [phone]  Email Address: [email or web address]

## 2017-09-11 NOTE — Progress Notes (Signed)
Dr. Nemiah CommanderKalisetti made aware of + orthostatic BP when working with PT/ pt complained of some dizziness when standing/ resolved when pt sat down/ will continue to monitor

## 2017-09-11 NOTE — Progress Notes (Signed)
ANTICOAGULATION CONSULT NOTE - Initial Consult  Pharmacy Consult for Heparin drip Indication: chest pain/ACS  No Known Allergies  Patient Measurements: Height: 5\' 9"  (175.3 cm) Weight: 220 lb 12.8 oz (100.2 kg) IBW/kg (Calculated) : 70.7 Heparin Dosing Weight: 89.1 kg  Vital Signs: Temp: 97.5 F (36.4 C) (06/20 0339) Temp Source: Oral (06/20 0339) BP: 129/73 (06/20 0339) Pulse Rate: 73 (06/20 0339)  Labs: Recent Labs    09/09/17 0927  09/09/17 1048 09/09/17 1426 09/09/17 2143 09/10/17 0152 09/10/17 0408 09/10/17 1744 09/11/17 0143 09/11/17 1100  HGB 13.8  --   --   --   --   --  13.0  --  11.8*  --   HCT 39.9*  --   --   --   --   --  37.0*  --  33.1*  --   PLT 189  --   --   --   --   --  197  --  173  --   HEPARINUNFRC  --   --   --   --   --   --   --  0.75* 0.52 0.49  CREATININE  --   --  1.13  --   --   --  1.04  --  1.04  --   TROPONINI  --    < > 0.11* 0.92* 1.22* 1.41*  --   --   --   --    < > = values in this interval not displayed.    Estimated Creatinine Clearance: 51.8 mL/min (by C-G formula based on SCr of 1.04 mg/dL).   Medical History: Past Medical History:  Diagnosis Date  . Complete heart block (HCC)   . Hypertension     Assessment: Patient is a 84yo male admitted for chest pain, troponins are trending up. Pharmacy consulted for Heparin infusion.  Goal of Therapy:  Heparin level 0.3-0.7 units/ml Monitor platelets by anticoagulation protocol: Yes   Plan:  06/20 @ 0200 HL 0.54 therapeutic. Will continue rate of 1050 units/hr and will recheck HL @ 1000. Hgb down by 1 unit will continue to monitor.  06/20 @1100  HL 0.49- 2nd therapeutic level. Will continue current infusion rate of 1050 units/hr and recheck HL and CBC with AM labs per protocol.   Gardner CandleSheema M Natale Thoma, PharmD, BCPS Clinical Pharmacist 09/11/2017 11:34 AM

## 2017-09-12 LAB — SODIUM: Sodium: 129 mmol/L — ABNORMAL LOW (ref 135–145)

## 2017-09-12 LAB — BASIC METABOLIC PANEL
Anion gap: 7 (ref 5–15)
Anion gap: 7 (ref 5–15)
BUN: 11 mg/dL (ref 6–20)
BUN: 11 mg/dL (ref 6–20)
CO2: 22 mmol/L (ref 22–32)
CO2: 25 mmol/L (ref 22–32)
CREATININE: 0.92 mg/dL (ref 0.61–1.24)
Calcium: 8 mg/dL — ABNORMAL LOW (ref 8.9–10.3)
Calcium: 8.2 mg/dL — ABNORMAL LOW (ref 8.9–10.3)
Chloride: 99 mmol/L — ABNORMAL LOW (ref 101–111)
Chloride: 94 mmol/L — ABNORMAL LOW (ref 101–111)
Creatinine, Ser: 0.97 mg/dL (ref 0.61–1.24)
GFR calc Af Amer: >60 mL/min (ref >60)
Glucose, Bld: 99 mg/dL (ref 65–99)
Glucose, Bld: 118 mg/dL — ABNORMAL HIGH (ref 65–99)
Potassium: 4.5 mmol/L (ref 3.5–5.1)
Potassium: 4.3 mmol/L (ref 3.5–5.1)
SODIUM: 128 mmol/L — AB (ref 135–145)
SODIUM: 126 mmol/L — AB (ref 135–145)

## 2017-09-12 LAB — CBC
HCT: 32.8 % — ABNORMAL LOW (ref 40.0–52.0)
Hemoglobin: 11.9 g/dL — ABNORMAL LOW (ref 13.0–18.0)
MCH: 35.1 pg — ABNORMAL HIGH (ref 26.0–34.0)
MCHC: 36.3 g/dL — ABNORMAL HIGH (ref 32.0–36.0)
MCV: 96.8 fL (ref 80.0–100.0)
PLATELETS: 143 10*3/uL — AB (ref 150–440)
RBC: 3.39 MIL/uL — ABNORMAL LOW (ref 4.40–5.90)
RDW: 13.6 % (ref 11.5–14.5)
WBC: 9.5 10*3/uL (ref 3.8–10.6)

## 2017-09-12 LAB — GLUCOSE, CAPILLARY: GLUCOSE-CAPILLARY: 113 mg/dL — AB (ref 65–99)

## 2017-09-12 LAB — OSMOLALITY: OSMOLALITY: 267 mosm/kg — AB (ref 275–295)

## 2017-09-12 MED ORDER — METOPROLOL TARTRATE 25 MG PO TABS: 25.0000 mg | ORAL_TABLET | Freq: Two times a day (BID) | ORAL | 2 refills | Status: DC

## 2017-09-12 MED ORDER — ISOSORBIDE MONONITRATE ER 30 MG PO TB24: 30.0000 mg | ORAL_TABLET | Freq: Every day | ORAL | 3 refills | Status: DC

## 2017-09-12 MED ORDER — NITROGLYCERIN 0.4 MG SL SUBL: 0.4000 mg | SUBLINGUAL_TABLET | SUBLINGUAL | 1 refills | Status: DC | PRN

## 2017-09-12 MED ORDER — TOLVAPTAN 15 MG PO TABS
15.0000 mg | ORAL_TABLET | Freq: Once | ORAL | Status: AC
  Administered 2017-09-12: 15 mg via ORAL
  Filled 2017-09-12: qty 1

## 2017-09-12 MED ORDER — FLUTICASONE-SALMETEROL 250-50 MCG/DOSE IN AEPB: 1.0000 | INHALATION_SPRAY | Freq: Two times a day (BID) | RESPIRATORY_TRACT | 2 refills | Status: DC

## 2017-09-12 MED ORDER — PANTOPRAZOLE SODIUM 40 MG PO TBEC: 40.0000 mg | DELAYED_RELEASE_TABLET | Freq: Every day | ORAL | 2 refills | Status: DC

## 2017-09-12 MED ORDER — BENZONATATE 100 MG PO CAPS: 100.0000 mg | ORAL_CAPSULE | Freq: Three times a day (TID) | ORAL | 0 refills | Status: AC

## 2017-09-12 MED ORDER — ASPIRIN 81 MG PO TBEC: 81.0000 mg | DELAYED_RELEASE_TABLET | Freq: Every day | ORAL | 3 refills | Status: DC

## 2017-09-12 MED ORDER — HYDROCOD POLST-CPM POLST ER 10-8 MG/5ML PO SUER: 5.0000 mL | Freq: Two times a day (BID) | ORAL | 0 refills | Status: DC

## 2017-09-12 MED ORDER — ATORVASTATIN CALCIUM 80 MG PO TABS: 80.0000 mg | ORAL_TABLET | Freq: Every day | ORAL | 3 refills | Status: DC

## 2017-09-12 MED ORDER — HYDROCOD POLST-CPM POLST ER 10-8 MG/5ML PO SUER
5.0000 mL | Freq: Two times a day (BID) | ORAL | Status: DC
  Administered 2017-09-12: 5 mL via ORAL
  Filled 2017-09-12: qty 5

## 2017-09-12 MED ORDER — CLOPIDOGREL BISULFATE 75 MG PO TABS: 75.0000 mg | ORAL_TABLET | Freq: Every day | ORAL | 3 refills | Status: DC

## 2017-09-12 MED ORDER — MOMETASONE FURO-FORMOTEROL FUM 200-5 MCG/ACT IN AERO
2.0000 | INHALATION_SPRAY | Freq: Two times a day (BID) | RESPIRATORY_TRACT | Status: DC
  Administered 2017-09-12 (×2): 2 via RESPIRATORY_TRACT
  Filled 2017-09-12: qty 8.8

## 2017-09-12 MED ORDER — ALBUTEROL SULFATE HFA 108 (90 BASE) MCG/ACT IN AERS: 2.0000 | INHALATION_SPRAY | Freq: Four times a day (QID) | RESPIRATORY_TRACT | 2 refills | Status: DC | PRN

## 2017-09-12 NOTE — Progress Notes (Signed)
Telemetry monitor discontinued, IV removed. Instructions given to the daughter. Pt discharged home. No complaints of pain at the time.

## 2017-09-12 NOTE — Care Management Important Message (Signed)
Copy of signed IM left with patient in room.  

## 2017-09-12 NOTE — Progress Notes (Signed)
[facility]  ROUNDING NOTE   Subjective:  Patient did appear a bit more awake today. He was sitting up in chair when evaluated. Serum sodium remains low at 126. CT chest was performed which reveals bronchitic change in the lungs.   Objective:  Vital signs in last 24 hours:  Temp:  [97.4 F (36.3 C)-98.8 F (37.1 C)] 97.9 F (36.6 C) (06/21 0724) Pulse Rate:  [69-76] 69 (06/21 0854) Resp:  [18] 18 (06/21 0305) BP: (130-171)/(66-95) 159/74 (06/21 0854) SpO2:  [94 %-97 %] 96 % (06/21 0854)  Weight change:  Filed Weights   09/09/17 0923 09/09/17 1344  Weight: 90.7 kg (200 lb) 100.2 kg (220 lb 12.8 oz)    Intake/Output: I/O last 3 completed shifts: In: 943.5 [I.V.:943.5] Out: 1250 [Urine:1250]   Intake/Output this shift:  Total I/O In: 840 [P.O.:840] Out: -   Physical Exam: General: No acute distress  Head: Normocephalic, atraumatic. Moist oral mucosal membranes  Eyes: Anicteric  Neck: Supple, trachea midline  Lungs:   Scattered rhonchi and wheezes, normal effort  Heart: S1S2 no rubs  Abdomen:  Soft, nontender, bowel sounds present  Extremities: Trace peripheral edema.  Neurologic: Awake, alert, following commands  Skin: No lesions       Basic Metabolic Panel: Recent Labs  Lab 09/09/17 1048  09/10/17 0408 09/11/17 0143 09/11/17 1100 09/12/17 0549 09/12/17 1039  NA 123*   < > 128* 127* 127* 128* 126*  K 4.5  --  4.1 4.5  --  4.5 4.3  CL 89*  --  96* 97*  --  99* 94*  CO2 25  --  24 24  --  22 25  GLUCOSE 138*  --  110* 149*  --  99 118*  BUN 21*  --  17 16  --  11 11  CREATININE 1.13  --  1.04 1.04  --  0.92 0.97  CALCIUM 8.6*  --  8.0* 8.1*  --  8.0* 8.2*   < > = values in this interval not displayed.    Liver Function Tests: Recent Labs  Lab 09/09/17 1048  AST 33  ALT 41  ALKPHOS 43  BILITOT 0.6  PROT 7.3  ALBUMIN 4.5   No results for input(s): LIPASE, AMYLASE in the last 168 hours. No results for input(s): AMMONIA in the  last 168 hours.  CBC: Recent Labs  Lab 09/09/17 0927 09/10/17 0408 09/11/17 0143 09/12/17 0549  WBC 10.0 10.1 9.2 9.5  NEUTROABS 7.5*  --   --   --   HGB 13.8 13.0 11.8* 11.9*  HCT 39.9* 37.0* 33.1* 32.8*  MCV 98.9 99.1 99.3 96.8  PLT 189 197 173 143*    Cardiac Enzymes: Recent Labs  Lab 09/09/17 1048 09/09/17 1426 09/09/17 2143 09/10/17 0152  TROPONINI 0.11* 0.92* 1.22* 1.41*    BNP: Invalid input(s): POCBNP  CBG: Recent Labs  Lab 09/10/17 0812  GLUCAP 106*    Microbiology: No results found for this or any previous visit.  Coagulation Studies: No results for input(s): LABPROT, INR in the last 72 hours.  Urinalysis: No results for input(s): COLORURINE, LABSPEC, PHURINE, GLUCOSEU, HGBUR, BILIRUBINUR, KETONESUR, PROTEINUR, UROBILINOGEN, NITRITE, LEUKOCYTESUR in the last 72 hours.  Invalid input(s): APPERANCEUR    Imaging: Ct Chest Wo Contrast  Result Date: 09/11/2017 CLINICAL DATA:  84 year old male with past medical history significant for hypertension, history of complete heart block status post dual-chamber pacemaker presents to hospital secondary to chest pain. Chest xray done 6-18. Pt currently  wheezing. EXAM: CT CHEST WITHOUT CONTRAST TECHNIQUE: Multidetector CT imaging of the chest was performed following the standard protocol without IV contrast. COMPARISON:  09/09/2017 FINDINGS: Cardiovascular: There is trace pericardial effusion. Mild cardiomegaly. Pacemaker leads are present in the RIGHT atrium and RIGHT ventricle. There is dense atherosclerotic calcification of the coronary arteries. The thoracic aorta is partially calcified but not aneurysmal. Mediastinum/Nodes: The visualized portion of the thyroid gland has a normal appearance. No mediastinal, hilar, or axillary adenopathy. Esophagus is normal in appearance. Lungs/Pleura: Study quality is degraded by motion artifact.Minimal atelectasis at both lung bases. No suspicious pulmonary nodules. No pleural  effusions or consolidations. There is central peribronchial thickening. Upper Abdomen: The gallbladder is present. A 12 millimeter low-attenuation lesion is identified at the dome of the liver, nonspecific and not further characterized. There scattered calcified granulomata within the liver. Low-attenuation lesions in the RIGHT kidney are consistent with cysts. There is dense atherosclerotic calcification of the abdominal aorta. Musculoskeletal: Degenerative changes are seen in the thoracic spine. No suspicious lytic or blastic lesions are identified. IMPRESSION: 1. Mild cardiomegaly and coronary artery disease. 2. Bronchitic changes. 3.  Aortic atherosclerosis.  (ICD10-I70.0) 4. RIGHT renal cyst. 5. 12 millimeter low-attenuation lesion within the dome of the RIGHT hepatic lobe is consistent with a benign cyst. Electronically Signed   By: Norva PavlovElizabeth  Brown M.D.   On: 09/11/2017 12:50     Medications:    . aspirin EC  81 mg Oral Daily  . atorvastatin  80 mg Oral q1800  . benzonatate  100 mg Oral TID  . chlorpheniramine-HYDROcodone  5 mL Oral Q12H  . clopidogrel  75 mg Oral Daily  . diltiazem  180 mg Oral Daily  . doxazosin  2 mg Oral Daily  . enoxaparin (LOVENOX) injection  40 mg Subcutaneous Q24H  . famotidine  20 mg Oral Daily  . isosorbide mononitrate  30 mg Oral Daily  . losartan  100 mg Oral Daily  . metoprolol tartrate  25 mg Oral BID  . mometasone-formoterol  2 puff Inhalation BID   acetaminophen **OR** acetaminophen, acetaminophen, guaiFENesin-codeine, ipratropium-albuterol, nitroGLYCERIN, ondansetron **OR** ondansetron (ZOFRAN) IV, ondansetron (ZOFRAN) IV, senna-docusate  Assessment/ Plan:  84 y.o. male with a PMHx of hypertension, history of pacemaker placement, chronic cough, remote tobacco abuse who was [facility] for evaluation of chest pain.   1.  Hyponatremia.  2.  Acute myocardial infarction.  3.  Hypertension.  4.  Chronic Bronchitis  Plan: CT of the  chest was reviewed and there was no obvious lung mass.  However there was bronchitic change within the lungs.  This was discussed in depth with the hospitalist who has added steroid inhaler along with long-acting beta-2 agonist.  In regards to the hyponatremia serum sodium down to 126.  I counseled the patient and his caregivers to limit his fluid intake to 1.2 L/day.  We have also added tolvaptan 15 mg p.o. x1.  He should respond well to this.  Patient has history of low-grade hyponatremia.  He normally resides in the low 130s.  We will continue to monitor along with you.     LOS: 3 Ron Junco 6/21/20194:08 PM

## 2017-09-12 NOTE — Care Management (Signed)
Have confirmed that patient is from independent living [facility] and not [facility] with round the clock caregiver support.  Ruled in for nstemi and nephrology is consulting for low sodium.  Level is 128 today- up from 127

## 2017-09-12 NOTE — Progress Notes (Signed)
Physical Therapy Treatment Patient Details Name: Gary York MRN: [name] DOB: 04/21/1921 Today's Date: 09/12/2017    History of Present Illness Pt is a 84 y/o M who presented with chest pain. Pt with elevation of troponin consistent with NSTEMI, per Cardiology decision has been made for conservative therapy and Cardiologist recommended to ambulate the pt and assess for chest pain.  PT order received.  Pt's PMH includes pacemaker placement, complete heart block.      PT Comments    Gary York was very pleasant and agreeable to therapy.  He was negative for orthostatics this session (BP in sitting 100/59, in standing 118/106, in sitting at end of session 107/57.  RN notified of BP readings).  Pt denied chest pain throughout session but does continue to demonstrate wheezing at rest which increases with activity.  Coached pt in pursed lip breathing again today with some improvement.  Introduced light therapeutic exercises in sitting for pt to continue performing throughout the day.  Pt ambulated 90 ft with SPC and reporting BLE weakness.  Encouraged pt to participate in Cardiopulmonary Rehab at d/c.     Follow Up Recommendations  No PT follow up     Equipment Recommendations  None recommended by PT    Recommendations for Other Services Other (comment)(Cardiopulmonary Rehab)     Precautions / Restrictions Precautions Precautions: Fall;Other (comment) Precaution Comments: Monitor HR, +orthostatic Restrictions Weight Bearing Restrictions: No    Mobility  Bed Mobility               General bed mobility comments: Pt sitting in chair at start and end of session  Transfers Overall transfer level: Needs assistance Equipment used: Straight cane Transfers: Sit to/from Stand Sit to Stand: Supervision         General transfer comment: Supervision for safety as pt with +orthostatics last session.  Pt denies dizziness with sit>stand this session.    Ambulation/Gait Ambulation/Gait assistance: Supervision Gait Distance (Feet): 90 Feet Assistive device: Straight cane Gait Pattern/deviations: WFL(Within Functional Limits)     General Gait Details: No gait abnormalities noted.  Pt denies chest pain while ambulating and throughout session.  HR in low 80s, up from low 70s at rest.  SpO2 remains at or above 92% on RA although pt wheezing (daughter reports this is his baseline for >20 years). Pt reports "my legs feel as weak as water" when ambulating.     Stairs             Wheelchair Mobility    Modified Rankin (Stroke Patients Only)       Balance Overall balance assessment: Modified Independent                                          Cognition Arousal/Alertness: Awake/alert Behavior During Therapy: WFL for tasks assessed/performed Overall Cognitive Status: History of cognitive impairments - at baseline                                        Exercises General Exercises - Upper Extremity Shoulder Flexion: AROM;Both;10 reps;Seated General Exercises - Lower Extremity Ankle Circles/Pumps: AROM;Both;10 reps;Seated Long Arc Quad: AROM;Both;10 reps;Seated Hip Flexion/Marching: Both;10 reps;Seated    General Comments General comments (skin integrity, edema, etc.): BP in sitting 100/59, in standing 118/106, in sitting at end  of session 107/57.  RN notified of BP readings.       Pertinent Vitals/Pain Pain Assessment: No/denies pain    Home Living                      Prior Function            PT Goals (current goals can now be found in the care plan section) Acute Rehab PT Goals Patient Stated Goal: to return home PT Goal Formulation: With patient Time For Goal Achievement: 09/25/17 Potential to Achieve Goals: Good Progress towards PT goals: Progressing toward goals    Frequency    Min 2X/week      PT Plan Current plan remains appropriate    Co-evaluation               AM-PAC PT "6 Clicks" Daily Activity  Outcome Measure  Difficulty turning over in bed (including adjusting bedclothes, sheets and blankets)?: None Difficulty moving from lying on back to sitting on the side of the bed? : None Difficulty sitting down on and standing up from a chair with arms (e.g., wheelchair, bedside commode, etc,.)?: A Little Help needed moving to and from a bed to chair (including a wheelchair)?: A Little Help needed walking in hospital room?: A Little Help needed climbing 3-5 steps with a railing? : A Little 6 Click Score: 20    End of Session Equipment Utilized During Treatment: Gait belt Activity Tolerance: Patient limited by fatigue Patient left: in chair;with call bell/phone within reach;with family/visitor present(caregiver to stay with pt in room) Nurse Communication: Mobility status;Other (comment)(BP readings ) [name]     Time: 1438-1500 PT Time Calculation (min) (ACUTE ONLY): 22 min  Charges:  $Gait Training: 8-22 mins                    G Codes:        Gary York PT, DPT 09/12/2017, 3:15 PM

## 2017-09-13 NOTE — Discharge Summary (Signed)
[facility] [place] [facility]   PATIENT NAME: Gary York    MR#:  [id number]  DATE OF BIRTH:  [birth year]  DATE OF ADMISSION:  09/09/2017   ADMITTING PHYSICIAN: Ramonita Lab, MD  DATE OF DISCHARGE: 09/12/2017  8:52 PM  PRIMARY CARE PHYSICIAN: Patient, No Pcp Per   ADMISSION DIAGNOSIS:   NSTEMI (non-ST elevated myocardial infarction) (HCC) [I21.4]  DISCHARGE DIAGNOSIS:   Active Problems:   Chest pain   SECONDARY DIAGNOSIS:   Past Medical History:  Diagnosis Date  . Complete heart block (HCC)   . Hypertension     HOSPITAL COURSE:   84 year old male with past medical history significant for hypertension, history of complete heart block status post dual-chamber pacemaker presents to hospital secondary to chest pain.  1. NSTEMI-appreciate cardiology consult. -No further chest pain after admission - was on IV heparin, conservative management at this time -He has discussed with family, they have opted for no cardiac catheterization at this time since patient is not symptomatic. -Started on on asa, plavix, statin, metoprolol, Imdur and losartan - ECHO with normal ejection fraction, no wall motion abnormalities noted. -Has ambulated well without any further chest pain. -Appreciate cardiology consult  2.  History of complete heart block-status post pacemaker -On Cardizem and metoprolol  3.  Hypertension-on losartan, metoprolol, Imdur and Cardizem  4. Hyponatremia-initially thought to be hypovolemic and IV fluids were given.  Some improvement noted from 123 04/25/2025 and sodium has remained low. -Consistent with SIADH.  Patient received a dose of Lasix and was started on fluid restriction.  Nephrology has been consulted. -CT chest with no pulmonary nodules noted. -Patient did receive 1 dose of tolvaptan prior to discharge and that improved her sodium up to 129. -Recommended fluid restriction at home.  And also to follow-up with nephrology as  outpatient within 1 to 2 weeks  5.  Cough-chronic according to family.  Has chronic bronchitis changes noted on CT chest. -Started on Advair, albuterol as needed inhaler -Also ordered some cough medicines and Protonix for reflux symptoms    Physical therapy recommended no further needs at discharge.    DISCHARGE CONDITIONS:   Guarded  CONSULTS OBTAINED:   Treatment Team:  Lamar Blinks, MD Mady Haagensen, MD  DRUG ALLERGIES:   No Known Allergies DISCHARGE MEDICATIONS:   Allergies as of 09/12/2017   No Known Allergies     Medication List    TAKE these medications   albuterol 108 (90 Base) MCG/ACT inhaler Commonly known as:  PROVENTIL HFA;VENTOLIN HFA Inhale 2 puffs into the lungs every 6 (six) hours as needed for wheezing or shortness of breath.   aspirin 81 MG EC tablet Take 1 tablet (81 mg total) by mouth daily.   atorvastatin 80 MG tablet Commonly known as:  LIPITOR Take 1 tablet (80 mg total) by mouth daily at 6 PM.   benzonatate 100 MG capsule Commonly known as:  TESSALON PERLES Take 1 capsule (100 mg total) by mouth 3 (three) times daily for 10 days. What changed:    when to take this  reasons to take this   chlorpheniramine-HYDROcodone 10-8 MG/5ML Suer Commonly known as:  TUSSIONEX Take 5 mLs by mouth every 12 (twelve) hours.   clopidogrel 75 MG tablet Commonly known as:  PLAVIX Take 1 tablet (75 mg total) by mouth daily.   DILTIAZEM CD 180 MG 24 hr capsule Generic drug:  diltiazem Take 180 mg by mouth daily.   doxazosin 2 MG tablet Commonly  known as:  CARDURA Take 2 mg by mouth daily.   Fluticasone-Salmeterol 250-50 MCG/DOSE Aepb Commonly known as:  ADVAIR DISKUS Inhale 1 puff into the lungs 2 (two) times daily.   guaiFENesin-codeine 100-10 MG/5ML syrup Take 5 mLs by mouth every 6 (six) hours as needed for cough.   isosorbide mononitrate 30 MG 24 hr tablet Commonly known as:  IMDUR Take 1 tablet (30 mg total) by mouth  daily.   losartan 100 MG tablet Commonly known as:  COZAAR Take 100 mg by mouth daily.   metoprolol tartrate 25 MG tablet Commonly known as:  LOPRESSOR Take 1 tablet (25 mg total) by mouth 2 (two) times daily.   nitroGLYCERIN 0.4 MG SL tablet Commonly known as:  NITROSTAT Place 1 tablet (0.4 mg total) under the tongue every 5 (five) minutes as needed for chest pain.   pantoprazole 40 MG tablet Commonly known as:  PROTONIX Take 1 tablet (40 mg total) by mouth daily.        DISCHARGE INSTRUCTIONS:   1. PCP f/u in 1 week  DIET:   Cardiac diet  ACTIVITY:   Activity as tolerated  OXYGEN:   Home Oxygen: No.  Oxygen Delivery: room air  DISCHARGE LOCATION:   home   If you experience worsening of your admission symptoms, develop shortness of breath, life threatening emergency, suicidal or homicidal thoughts you must seek medical attention immediately by calling 911 or calling your MD immediately  if symptoms less severe.  You Must read complete instructions/literature along with all the possible adverse reactions/side effects for all the Medicines you take and that have been prescribed to you. Take any new Medicines after you have completely understood and accpet all the possible adverse reactions/side effects.   Please note  You were cared for by a hospitalist during your hospital stay. If you have any questions about your discharge medications or the care you received while you were in the hospital after you are discharged, you can call the unit and asked to speak with the hospitalist on call if the hospitalist that took care of you is not available. Once you are discharged, your primary care physician will handle any further medical issues. Please note that NO REFILLS for any discharge medications will be authorized once you are discharged, as it is imperative that you return to your primary care physician (or establish a relationship with a primary care physician if you do  not have one) for your aftercare needs so that they can reassess your need for medications and monitor your lab values.    On the day of Discharge:  VITAL SIGNS:   Blood pressure (!) 148/83, pulse 69, temperature 97.7 F (36.5 C), temperature source Oral, resp. rate 20, height 5\' 9"  (1.753 m), weight 100.2 kg (220 lb 12.8 oz), SpO2 97 %.  PHYSICAL EXAMINATION:   GENERAL:  83 y.o.-year-old elderly patient lying in the bed with no acute distress.  EYES: Pupils equal, round, reactive to light and accommodation. No scleral icterus. Extraocular muscles intact.  HEENT: Head atraumatic, normocephalic. Oropharynx and nasopharynx clear.  NECK:  Supple, no jugular venous distention. No thyroid enlargement, no tenderness.  LUNGS: Normal breath sounds bilaterally, upper airway wheezing, no rales,rhonchi or crepitation. No use of accessory muscles of respiration.  CARDIOVASCULAR: S1, S2 normal. No  rubs, or gallops. 2/6 systolic murmur present  ABDOMEN: Soft, nontender, nondistended. Bowel sounds present. No organomegaly or mass.  EXTREMITIES: No pedal edema, cyanosis, or clubbing.  NEUROLOGIC: Cranial nerves II  through XII are intact. Muscle strength 5/5 in all extremities. Sensation intact. Gait not checked.  PSYCHIATRIC: The patient is alert and oriented x 3.  SKIN: No obvious rash, lesion, or ulcer.    DATA REVIEW:   CBC Recent Labs  Lab 09/12/17 0549  WBC 9.5  HGB 11.9*  HCT 32.8*  PLT 143*    Chemistries  Recent Labs  Lab 09/09/17 1048  09/12/17 1039 09/12/17 1718  NA 123*   < > 126* 129*  K 4.5   < > 4.3  --   CL 89*   < > 94*  --   CO2 25   < > 25  --   GLUCOSE 138*   < > 118*  --   BUN 21*   < > 11  --   CREATININE 1.13   < > 0.97  --   CALCIUM 8.6*   < > 8.2*  --   AST 33  --   --   --   ALT 41  --   --   --   ALKPHOS 43  --   --   --   BILITOT 0.6  --   --   --    < > = values in this interval not displayed.     Microbiology Results  No results found for  this or any previous visit.  RADIOLOGY:  No results found.   Management plans discussed with the patient, family and they are in agreement.  CODE STATUS:  Code Status History    Date Active Date Inactive Code Status Order ID Comments User Context   09/09/2017 1343 09/12/2017 2357 DNR [id number]  Ramonita LabGouru, Aruna, MD Inpatient   09/09/2017 1305 09/09/2017 1343 DNR [id number]  Ramonita LabGouru, Aruna, MD ED    Questions for Most Recent Historical Code Status (Order [id number])    Question Answer Comment   In the event of cardiac or respiratory ARREST Do not call a "code blue"    In the event of cardiac or respiratory ARREST Do not perform Intubation, CPR, defibrillation or ACLS    In the event of cardiac or respiratory ARREST Use medication by any route, position, wound care, and other measures to relive pain and suffering. May use oxygen, suction and manual treatment of airway obstruction as needed for comfort.    Comments RN may pronounce         Advance Directive Documentation     Most Recent Value  Type of Advance Directive  Out of facility DNR (pink MOST or yellow form)  Pre-existing out of facility DNR order (yellow form or pink MOST form)  Yellow form placed in chart (order not valid for inpatient use)  "MOST" Form in Place?  -      TOTAL TIME TAKING CARE OF THIS PATIENT: 38 minutes.    Enid BaasKALISETTI,Delisha Peaden M.D on 09/13/2017 at 12:09 PM  Between 7am to 6pm - Pager - [phone]  After 6pm go to [email or web address] - Social research officer, [email or web address] [email or web address]  [facility]  Office  [phone]  CC: Primary care physician; Patient, No Pcp Per   Note: This dictation was prepared with Dragon dictation along with smaller phrase technology. Any transcriptional errors that result from this process are unintentional.

## 2017-09-16 ENCOUNTER — Observation Stay
Admission: EM | Admit: 2017-09-16 | Discharge: 2017-09-18 | Disposition: A | Discharge status: Home Health | Payer: Medicare Other | Attending: Internal Medicine | Admitting: Internal Medicine

## 2017-09-16 ENCOUNTER — Other Ambulatory Visit: Payer: Self-pay

## 2017-09-16 ENCOUNTER — Emergency Department: Payer: Medicare Other

## 2017-09-16 ENCOUNTER — Encounter: Payer: Self-pay | Admitting: Emergency Medicine

## 2017-09-16 DIAGNOSIS — I110 Hypertensive heart disease with heart failure: Secondary | ICD-10-CM | POA: Diagnosis not present

## 2017-09-16 DIAGNOSIS — Z7982 Long term (current) use of aspirin: Secondary | ICD-10-CM | POA: Diagnosis not present

## 2017-09-16 DIAGNOSIS — Z7902 Long term (current) use of antithrombotics/antiplatelets: Secondary | ICD-10-CM | POA: Insufficient documentation

## 2017-09-16 DIAGNOSIS — Z7951 Long term (current) use of inhaled steroids: Secondary | ICD-10-CM | POA: Insufficient documentation

## 2017-09-16 DIAGNOSIS — J441 Chronic obstructive pulmonary disease with (acute) exacerbation: Secondary | ICD-10-CM | POA: Diagnosis not present

## 2017-09-16 DIAGNOSIS — Z87891 Personal history of nicotine dependence: Secondary | ICD-10-CM | POA: Insufficient documentation

## 2017-09-16 DIAGNOSIS — I252 Old myocardial infarction: Secondary | ICD-10-CM | POA: Diagnosis not present

## 2017-09-16 DIAGNOSIS — Z66 Do not resuscitate: Secondary | ICD-10-CM | POA: Diagnosis not present

## 2017-09-16 DIAGNOSIS — Z950 Presence of cardiac pacemaker: Secondary | ICD-10-CM | POA: Diagnosis not present

## 2017-09-16 DIAGNOSIS — I509 Heart failure, unspecified: Secondary | ICD-10-CM | POA: Diagnosis present

## 2017-09-16 DIAGNOSIS — I25110 Atherosclerotic heart disease of native coronary artery with unstable angina pectoris: Secondary | ICD-10-CM | POA: Insufficient documentation

## 2017-09-16 DIAGNOSIS — Z79899 Other long term (current) drug therapy: Secondary | ICD-10-CM | POA: Insufficient documentation

## 2017-09-16 DIAGNOSIS — E871 Hypo-osmolality and hyponatremia: Secondary | ICD-10-CM | POA: Diagnosis not present

## 2017-09-16 DIAGNOSIS — J9601 Acute respiratory failure with hypoxia: Secondary | ICD-10-CM | POA: Diagnosis not present

## 2017-09-16 DIAGNOSIS — E785 Hyperlipidemia, unspecified: Secondary | ICD-10-CM | POA: Diagnosis not present

## 2017-09-16 DIAGNOSIS — I5033 Acute on chronic diastolic (congestive) heart failure: Secondary | ICD-10-CM | POA: Diagnosis not present

## 2017-09-16 HISTORY — DX: Atherosclerotic heart disease of native coronary artery without angina pectoris: I25.10

## 2017-09-16 HISTORY — DX: Hyperlipidemia, unspecified: E78.5

## 2017-09-16 LAB — BASIC METABOLIC PANEL
Anion gap: 6 (ref 5–15)
BUN: 11 mg/dL (ref 8–23)
CALCIUM: 7.6 mg/dL — AB (ref 8.9–10.3)
CHLORIDE: 103 mmol/L (ref 98–111)
CO2: 23 mmol/L (ref 22–32)
CREATININE: 0.83 mg/dL (ref 0.61–1.24)
GFR calc Af Amer: >60 mL/min (ref >60)
GFR calc non Af Amer: >60 mL/min (ref >60)
Glucose, Bld: 101 mg/dL — ABNORMAL HIGH (ref 70–99)
Potassium: 3.5 mmol/L (ref 3.5–5.1)
SODIUM: 132 mmol/L — AB (ref 135–145)

## 2017-09-16 LAB — TROPONIN I
TROPONIN I: 0.05 ng/mL — AB (ref <0.03)
TROPONIN I: 0.04 ng/mL — AB (ref <0.03)
Troponin I: 0.05 ng/mL (ref <0.03)

## 2017-09-16 LAB — CBC
HCT: 36.7 % — ABNORMAL LOW (ref 40.0–52.0)
Hemoglobin: 12.8 g/dL — ABNORMAL LOW (ref 13.0–18.0)
MCH: 34.6 pg — ABNORMAL HIGH (ref 26.0–34.0)
MCHC: 34.9 g/dL (ref 32.0–36.0)
MCV: 99.3 fL (ref 80.0–100.0)
PLATELETS: 191 10*3/uL (ref 150–440)
RBC: 3.7 MIL/uL — ABNORMAL LOW (ref 4.40–5.90)
RDW: 13.9 % (ref 11.5–14.5)
WBC: 12.1 10*3/uL — AB (ref 3.8–10.6)

## 2017-09-16 LAB — LIPID PANEL
Cholesterol: 101 mg/dL (ref 0–200)
HDL: 49 mg/dL (ref >40)
LDL CALC: 46 mg/dL (ref 0–99)
Total CHOL/HDL Ratio: 2.1 RATIO
Triglycerides: 29 mg/dL (ref <150)
VLDL: 6 mg/dL (ref 0–40)

## 2017-09-16 MED ORDER — BUDESONIDE 0.5 MG/2ML IN SUSP
0.5000 mg | Freq: Two times a day (BID) | RESPIRATORY_TRACT | Status: DC
  Administered 2017-09-16 – 2017-09-18 (×4): 0.5 mg via RESPIRATORY_TRACT
  Filled 2017-09-16 (×5): qty 2

## 2017-09-16 MED ORDER — ATORVASTATIN CALCIUM 20 MG PO TABS
80.0000 mg | ORAL_TABLET | Freq: Every day | ORAL | Status: DC
  Administered 2017-09-16 – 2017-09-17 (×2): 80 mg via ORAL
  Filled 2017-09-16 (×2): qty 4

## 2017-09-16 MED ORDER — METHYLPREDNISOLONE SODIUM SUCC 40 MG IJ SOLR
40.0000 mg | Freq: Every day | INTRAMUSCULAR | Status: DC
  Administered 2017-09-16 – 2017-09-18 (×3): 40 mg via INTRAVENOUS
  Filled 2017-09-16 (×3): qty 1

## 2017-09-16 MED ORDER — ACETAMINOPHEN 325 MG PO TABS: 650.0000 mg | ORAL_TABLET | Freq: Four times a day (QID) | ORAL | Status: DC | PRN

## 2017-09-16 MED ORDER — LOSARTAN POTASSIUM 50 MG PO TABS
100.0000 mg | ORAL_TABLET | Freq: Every day | ORAL | Status: DC
  Administered 2017-09-17 – 2017-09-18 (×2): 100 mg via ORAL
  Filled 2017-09-16 (×2): qty 2

## 2017-09-16 MED ORDER — CLOPIDOGREL BISULFATE 75 MG PO TABS
75.0000 mg | ORAL_TABLET | Freq: Every day | ORAL | Status: DC
  Administered 2017-09-16 – 2017-09-18 (×3): 75 mg via ORAL
  Filled 2017-09-16 (×3): qty 1

## 2017-09-16 MED ORDER — ISOSORBIDE MONONITRATE ER 30 MG PO TB24
30.0000 mg | ORAL_TABLET | Freq: Every day | ORAL | Status: DC
  Administered 2017-09-16 – 2017-09-17 (×2): 30 mg via ORAL
  Filled 2017-09-16 (×2): qty 1

## 2017-09-16 MED ORDER — FUROSEMIDE 10 MG/ML IJ SOLN
60.0000 mg | Freq: Once | INTRAMUSCULAR | Status: AC
  Administered 2017-09-16: 60 mg via INTRAVENOUS
  Filled 2017-09-16: qty 8

## 2017-09-16 MED ORDER — HYDROCOD POLST-CPM POLST ER 10-8 MG/5ML PO SUER
5.0000 mL | Freq: Two times a day (BID) | ORAL | Status: DC
  Administered 2017-09-16 – 2017-09-17 (×3): 5 mL via ORAL
  Filled 2017-09-16 (×3): qty 5

## 2017-09-16 MED ORDER — METOPROLOL TARTRATE 5 MG/5ML IV SOLN
2.5000 mg | Freq: Once | INTRAVENOUS | Status: AC
  Administered 2017-09-16: 2.5 mg via INTRAVENOUS
  Filled 2017-09-16: qty 5

## 2017-09-16 MED ORDER — ENOXAPARIN SODIUM 100 MG/ML ~~LOC~~ SOLN
1.0000 mg/kg | Freq: Two times a day (BID) | SUBCUTANEOUS | Status: DC
  Administered 2017-09-16 (×2): 100 mg via SUBCUTANEOUS
  Filled 2017-09-16 (×3): qty 1

## 2017-09-16 MED ORDER — NITROGLYCERIN 0.4 MG SL SUBL: 0.4000 mg | SUBLINGUAL_TABLET | SUBLINGUAL | Status: DC | PRN

## 2017-09-16 MED ORDER — METOPROLOL TARTRATE 25 MG PO TABS
25.0000 mg | ORAL_TABLET | Freq: Two times a day (BID) | ORAL | Status: DC
  Administered 2017-09-16 – 2017-09-18 (×5): 25 mg via ORAL
  Filled 2017-09-16 (×5): qty 1

## 2017-09-16 MED ORDER — IPRATROPIUM-ALBUTEROL 0.5-2.5 (3) MG/3ML IN SOLN
3.0000 mL | Freq: Four times a day (QID) | RESPIRATORY_TRACT | Status: DC
  Administered 2017-09-16 – 2017-09-18 (×6): 3 mL via RESPIRATORY_TRACT
  Filled 2017-09-16 (×9): qty 3

## 2017-09-16 MED ORDER — ASPIRIN EC 81 MG PO TBEC
81.0000 mg | DELAYED_RELEASE_TABLET | Freq: Every day | ORAL | Status: DC
  Administered 2017-09-17 – 2017-09-18 (×2): 81 mg via ORAL
  Filled 2017-09-16 (×3): qty 1

## 2017-09-16 MED ORDER — ACETAMINOPHEN 650 MG RE SUPP: 650.0000 mg | Freq: Four times a day (QID) | RECTAL | Status: DC | PRN

## 2017-09-16 MED ORDER — DILTIAZEM HCL ER COATED BEADS 180 MG PO CP24
180.0000 mg | ORAL_CAPSULE | Freq: Every day | ORAL | Status: DC
  Administered 2017-09-16 – 2017-09-18 (×3): 180 mg via ORAL
  Filled 2017-09-16 (×3): qty 1

## 2017-09-16 MED ORDER — FUROSEMIDE 10 MG/ML IJ SOLN
20.0000 mg | Freq: Two times a day (BID) | INTRAMUSCULAR | Status: DC
  Administered 2017-09-16 – 2017-09-17 (×2): 20 mg via INTRAVENOUS
  Filled 2017-09-16 (×2): qty 2

## 2017-09-16 MED ORDER — PANTOPRAZOLE SODIUM 40 MG PO TBEC
40.0000 mg | DELAYED_RELEASE_TABLET | Freq: Every day | ORAL | Status: DC
  Administered 2017-09-16 – 2017-09-18 (×3): 40 mg via ORAL
  Filled 2017-09-16 (×3): qty 1

## 2017-09-16 MED ORDER — DOXAZOSIN MESYLATE 2 MG PO TABS
2.0000 mg | ORAL_TABLET | Freq: Every day | ORAL | Status: DC
  Administered 2017-09-16 – 2017-09-18 (×3): 2 mg via ORAL
  Filled 2017-09-16 (×3): qty 1

## 2017-09-16 NOTE — Consult Note (Signed)
Reason for Consult: Angina shortness of breath congestive heart failure hypoxemia Referring Physician: Dr. Leslye Peer hospitalist, Dr. Netty Starring primary Cardiologist Dr. Aldona Lento is an 84 y.o. male.  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  Patient was found to be hypoxic with sats in 80s he has had cough congestion and wheezing seen in the emergency room treated and advised to be admitted is worse chest pain was 4 out of 10 which is his usual anginal symptoms patient has elected to be treated medically.  History of significant COPD as well from a long history of smoking and is been treated with steroid therapy and inhalers as well.  Chest pain symptoms are somewhat improved and he is less short of breath today  Past Medical History:  Diagnosis Date  . CAD (coronary artery disease)   . Complete heart block ([place])   . Hyperlipidemia   . Hypertension     Past Surgical History:  Procedure Laterality Date  . PACEMAKER IMPLANT      Family History  Problem Relation Age of Onset  . Dementia Mother   . Diabetes Mother   . Dementia Father     Social History:  reports that he quit smoking about 29 years ago. His smoking use included cigarettes. He has a 20.00 pack-year smoking history. He has never used smokeless tobacco. He reports that he drank alcohol. He reports that he does not use drugs.  Allergies: No Known Allergies  Medications: I have reviewed the patient's current medications.  Results for orders placed or performed during the hospital encounter of 09/16/17 (from the past 48 hour(s))  Basic metabolic panel     Status: Abnormal   Collection Time: 09/16/17  6:47 AM  Result Value Ref Range   Sodium 132 (L) 135 - 145 mmol/L   Potassium 3.5 3.5 - 5.1 mmol/L   Chloride 103 98 -  111 mmol/L   CO2 23 22 - 32 mmol/L   Glucose, Bld 101 (H) 70 - 99 mg/dL   BUN 11 8 - 23 mg/dL   Creatinine, Ser 0.83 0.61 - 1.24 mg/dL   Calcium 7.6 (L) 8.9 - 10.3 mg/dL   GFR calc non Af Amer >60 >60 mL/min   GFR calc Af Amer >60 >60 mL/min    Comment: (NOTE) The eGFR has been calculated using the CKD EPI equation. This calculation has not been validated in all clinical situations. eGFR's persistently <60 mL/min signify possible Chronic Kidney Disease.    Anion gap 6 5 - 15    Comment: Performed at [facility], [address] [place], [place] [address]  CBC     Status: Abnormal   Collection Time: 09/16/17  6:47 AM  Result Value Ref Range   WBC 12.1 (H) 3.8 - 10.6 K/uL   RBC 3.70 (L) 4.40 - 5.90 MIL/uL   Hemoglobin 12.8 (L) 13.0 - 18.0 g/dL   HCT 36.7 (L) 40.0 - 52.0 %   MCV 99.3 80.0 - 100.0 fL   MCH 34.6 (H) 26.0 - 34.0 pg   MCHC 34.9 32.0 - 36.0 g/dL   RDW 13.9 11.5 - 14.5 %   Platelets 191 150 - 440 K/uL    Comment: Performed at [facility], [address] [place], [place] [address]  Troponin I     Status: Abnormal  Collection Time: 09/16/17  6:47 AM  Result Value Ref Range   Troponin I 0.05 (HH) <0.03 ng/mL    Comment: CRITICAL RESULT CALLED TO, READ BACK BY AND VERIFIED WITH KELLY WALKER@0720  ON 09/16/17 BY HKP Performed at [facility], [address] [place], [place] [address]   Troponin I     Status: Abnormal   Collection Time: 09/16/17 10:32 AM  Result Value Ref Range   Troponin I 0.05 (HH) <0.03 ng/mL    Comment: CRITICAL VALUE NOTED. VALUE IS CONSISTENT WITH PREVIOUSLY REPORTED/CALLED VALUE MGP Performed at [facility], [address] [place], [place] [address]   Lipid panel     Status: None   Collection Time: 09/16/17 10:32 AM  Result Value Ref Range   Cholesterol 101 0 - 200 mg/dL   Triglycerides 29 <150 mg/dL   HDL 49 >40 mg/dL   Total CHOL/HDL Ratio 2.1 RATIO   VLDL 6 0 - 40 mg/dL   LDL Cholesterol 46  0 - 99 mg/dL    Comment:        Total Cholesterol/HDL:CHD Risk Coronary Heart Disease Risk Table                     Men   Women  1/2 Average Risk   3.4   3.3  Average Risk       5.0   4.4  2 X Average Risk   9.6   7.1  3 X Average Risk  23.4   11.0        Use the calculated Patient Ratio above and the CHD Risk Table to determine the patient's CHD Risk.        ATP III CLASSIFICATION (LDL):  <100     mg/dL   Optimal  100-129  mg/dL   Near or Above                    Optimal  130-159  mg/dL   Borderline  160-189  mg/dL   High  >190     mg/dL   Very High Performed at [facility], [address] [place], [place] [address]   Troponin I     Status: Abnormal   Collection Time: 09/16/17  3:09 PM  Result Value Ref Range   Troponin I 0.04 (HH) <0.03 ng/mL    Comment: CRITICAL VALUE NOTED. VALUE IS CONSISTENT WITH PREVIOUSLY REPORTED/CALLED VALUE / MGP Performed at [facility], [address] [place], [place] [address]     Dg Chest 2 View  Result Date: 09/16/2017 CLINICAL DATA:  Recent MI. Now with chest pain and shortness of breath with improvement in symptoms after nitroglycerin and aspirin. Former smoker. EXAM: CHEST - 2 VIEW COMPARISON:  CT scan of the chest of September 11, 2017 and PA and lateral chest x-ray of September 09, 2017. FINDINGS: There remains elevation of the right hemidiaphragm. The interstitial markings are both lungs are increased. The pulmonary vascularity is engorged. The cardiac silhouette is mildly enlarged. The ICD is in stable position. The mediastinum is normal in width. There is calcification in the wall of the thoracic aorta. There small bilateral pleural effusions layering posteriorly. IMPRESSION: CHF superimposed upon COPD. Chronic elevation of the right hemidiaphragm. No acute pneumonia. The appearance of the lungs has deteriorated since the previous study. Electronically Signed   By: David  Martinique M.D.   On: 09/16/2017 07:29    Review of  Systems  Constitutional: Positive for diaphoresis  and malaise/fatigue.  HENT: Positive for congestion, hearing loss and sinus pain.   Eyes: Negative.   Respiratory: Positive for cough, shortness of breath and wheezing.   Cardiovascular: Positive for chest pain, orthopnea, leg swelling and PND.  Gastrointestinal: Negative.   Genitourinary: Negative.   Musculoskeletal: Negative.   Skin: Negative.   Neurological: Positive for weakness.  Endo/Heme/Allergies: Negative.   Psychiatric/Behavioral: Negative.    Blood pressure (!) 141/74, pulse 74, temperature (!) 97.4 F (36.3 C), temperature source Oral, resp. rate 20, height 5' 9"  (1.753 m), weight 221 lb (100.2 kg), SpO2 94 %. Physical Exam  Nursing note and vitals reviewed. Constitutional: He is oriented to person, place, and time. He appears well-developed and well-nourished.  HENT:  Head: Normocephalic and atraumatic.  Eyes: Pupils are equal, round, and reactive to light. Conjunctivae and EOM are normal.  Neck: Normal range of motion. Neck supple.  Cardiovascular: Normal rate and regular rhythm. Exam reveals gallop.  Murmur heard. Respiratory: Effort normal. He has decreased breath sounds. He has wheezes. He has rhonchi.  GI: Soft. Bowel sounds are normal.  Musculoskeletal: He exhibits edema.  Neurological: He is alert and oriented to person, place, and time. He has normal reflexes.  Skin: Skin is warm and dry.  Psychiatric: He has a normal mood and affect.    Assessment/Plan: Congestive heart failure COPD exacerbation Shortness of breath Unstable angina Known coronary disease Complete heart block Hypertension Hyperlipidemia Hypoxemia Borderline obesity, . Plan Agree with admit to telemetry Follow-up troponins and EKGs Continue diuretic therapy for heart failure Supplemental oxygen as necessary for hypoxemia Agree with aggressive inhalers for COPD symptoms Increase imdur to 60 mg once a day for anginal  symptoms Continue beta-blockers Cardizem losartan for blood pressure control Maintain Lipitor for hyperlipidemia Maintain aggressive medical therapy for symptom management  Darbie Biancardi D Zamya Culhane 09/16/2017, 6:20 PM

## 2017-09-16 NOTE — ED Provider Notes (Signed)
[facility] Emergency Department Provider Note  Time seen: 8:07 AM  I have reviewed the triage vital signs and the nursing notes.   HISTORY  Chief Complaint Chest Pain and Shortness of Breath    HPI Gary York is a 84 y.o. male with a past medical history of hypertension, pacemaker, MI 1 week ago who presents to the emergency department for chest tightness with mild shortness of breath.  According to the patient this morning he developed tightness throughout his chest, took one nitroglycerin and states the tightness improved, mild shortness of breath.  Mild cough.  Denies any fever.  Denies any chest discomfort currently, no abdominal pain, nausea or diaphoresis.   Past Medical History:  Diagnosis Date  . Complete heart block ([facility])   . Hypertension     Patient Active Problem List   Diagnosis Date Noted  . Chest pain 09/09/2017    Past Surgical History:  Procedure Laterality Date  . PACEMAKER IMPLANT      Prior to Admission medications   Medication Sig Start Date End Date Taking? Authorizing Provider  albuterol (PROVENTIL HFA;VENTOLIN HFA) 108 (90 Base) MCG/ACT inhaler Inhale 2 puffs into the lungs every 6 (six) hours as needed for wheezing or shortness of breath. 09/12/17   Enid Baas, MD  aspirin EC 81 MG EC tablet Take 1 tablet (81 mg total) by mouth daily. 09/13/17   Enid Baas, MD  atorvastatin (LIPITOR) 80 MG tablet Take 1 tablet (80 mg total) by mouth daily at 6 PM. 09/12/17   Enid Baas, MD  benzonatate (TESSALON PERLES) 100 MG capsule Take 1 capsule (100 mg total) by mouth 3 (three) times daily for 10 days. 09/12/17 09/22/17  Enid Baas, MD  chlorpheniramine-HYDROcodone (TUSSIONEX) 10-8 MG/5ML SUER Take 5 mLs by mouth every 12 (twelve) hours. 09/12/17   Enid Baas, MD  clopidogrel (PLAVIX) 75 MG tablet Take 1 tablet (75 mg total) by mouth daily. 09/13/17   Enid Baas, MD  diltiazem (DILTIAZEM CD) 180 MG  24 hr capsule Take 180 mg by mouth daily.    [provider]  doxazosin (CARDURA) 2 MG tablet Take 2 mg by mouth daily. 08/19/17   [provider]  Fluticasone-Salmeterol (ADVAIR DISKUS) 250-50 MCG/DOSE AEPB Inhale 1 puff into the lungs 2 (two) times daily. 09/12/17 09/12/18  Enid Baas, MD  guaiFENesin-codeine 100-10 MG/5ML syrup Take 5 mLs by mouth every 6 (six) hours as needed for cough. 09/03/17   Minna Antis, MD  isosorbide mononitrate (IMDUR) 30 MG 24 hr tablet Take 1 tablet (30 mg total) by mouth daily. 09/13/17   Enid Baas, MD  losartan (COZAAR) 100 MG tablet Take 100 mg by mouth daily. 11/20/16   [provider]  metoprolol tartrate (LOPRESSOR) 25 MG tablet Take 1 tablet (25 mg total) by mouth 2 (two) times daily. 09/12/17   Enid Baas, MD  nitroGLYCERIN (NITROSTAT) 0.4 MG SL tablet Place 1 tablet (0.4 mg total) under the tongue every 5 (five) minutes as needed for chest pain. 09/12/17 11/11/17  Enid Baas, MD  pantoprazole (PROTONIX) 40 MG tablet Take 1 tablet (40 mg total) by mouth daily. 09/12/17   Enid Baas, MD    No Known Allergies  History reviewed. No pertinent family history.  Social History Social History   Tobacco Use  . Smoking status: Former Smoker    Packs/day: 1.00    Years: 20.00    Pack years: 20.00    Types: Cigarettes    Last attempt to  quit: 1990    Years since quitting: 29.4  . Smokeless tobacco: Never Used  Substance Use Topics  . Alcohol use: Not Currently  . Drug use: Never    Review of Systems Constitutional: Negative for fever. Eyes: Negative for visual complaints ENT: Negative for recent illness/congestion Cardiovascular: Mild chest tightness this morning, now resolved Respiratory: Mild shortness of breath with occasional cough Gastrointestinal: Negative for abdominal pain, vomiting Musculoskeletal: Mild lower extremity edema. Skin: Negative for skin complaints  Neurological:  Negative for headache All other ROS negative  ____________________________________________   PHYSICAL EXAM:  VITAL SIGNS: ED Triage Vitals  Enc Vitals Group     BP 09/16/17 0642 (!) 159/96     Pulse Rate 09/16/17 0642 70     Resp 09/16/17 0642 20     Temp 09/16/17 0642 97.8 F (36.6 C)     Temp Source 09/16/17 0642 Oral     SpO2 09/16/17 0640 93 %     Weight 09/16/17 0645 221 lb (100.2 kg)     Height 09/16/17 0645 5\' 9"  (1.753 m)     Head Circumference --      Peak Flow --      Pain Score 09/16/17 0643 5     Pain Loc --      Pain Edu? --      Excl. in GC? --    Constitutional: Alert and oriented. Well appearing and in no distress. Eyes: Normal exam ENT   Head: Normocephalic and atraumatic.   Mouth/Throat: Mucous membranes are moist. Cardiovascular: Normal rate, regular rhythm.  Respiratory: Normal respiratory effort without tachypnea nor retractions. Breath sounds are clear Gastrointestinal: Soft, mild distention, nontender. Musculoskeletal: Nontender with normal range of motion in all extremities.  1+ lower extremity edema, equal bilaterally, nontender. Neurologic:  Normal speech and language. No gross focal neurologic deficits  Skin:  Skin is warm, dry and intact.  Psychiatric: Mood and affect are normal.   ____________________________________________    EKG  EKG reviewed and interpreted by myself shows an atrial ventricular dual paced rhythm at 70 bpm, widened QRS with nonspecific ST changes  ____________________________________________    RADIOLOGY  Chest x-ray shows CHF superimposed on COPD deterioration since previous study.  ____________________________________________   INITIAL IMPRESSION / ASSESSMENT AND PLAN / ED COURSE  Pertinent labs & imaging results that were available during my care of the patient were reviewed by me and considered in my medical decision making (see chart for details).  Patient presents to the emergency department for  chest tightness and shortness of breath.  Patient suffered an MI 1 week ago.  Differential would include ACS, pulmonary edema, COPD, angina.  Overall the patient appears well, states he improved after taking nitroglycerin.  Patient does have an elevated blood pressure 159/96, room air saturation has decreased to 88%.  No home O2 requirement.  Labs show slight elevation in troponin 0.05 although largely normalized since last troponin last week.  Chest x-ray shows CHF superimposed on COPD.  Highly suspect congestive heart failure likely related to recent MI.  We will keep the patient on supplemental oxygen, dose IV Lasix and admit to the hospitalist service for further treatment and cardiac rule out.  Patient agreeable to plan of care.  ____________________________________________   FINAL CLINICAL IMPRESSION(S) / ED DIAGNOSES  [place] edema Congestive heart failure Hypoxia Chest pain    Minna AntisPaduchowski, Jacklyne Baik, MD 09/16/17 [phone]

## 2017-09-16 NOTE — Progress Notes (Signed)
Patient admitted to unit. Oriented to room, call bell, and staff. Bed in lowest position. Fall safety plan reviewed. Full assessment to Epic. Skin assessment verified with Seren[facility] RN. Telemetry box verification with tele clerk- Box#: --[id number]. Will continue to monitor.  Pateint/family refuses bed alarm. Educated on safety.

## 2017-09-16 NOTE — ED Triage Notes (Signed)
Pt arrived to the ED via EMS from home for complaints of chest pain and shortness of breath. Pt reports that he felt better after receiving 1 nitro and 324 of aspirin. EMS reports that the Pt was seen in this ED for a MI last week but did not go to the cath lab. Pt is alert in no apparent distress.

## 2017-09-16 NOTE — Care Management Obs Status (Signed)
MEDICARE OBSERVATION STATUS NOTIFICATION   Patient Details  Name: Gary York MRN: [id number] Date of Birth: 02/01/1922   Medicare Observation Status Notification Given:  Yes    Collie Siadngela Kimisha Eunice, RN 09/16/2017, 8:48 AM

## 2017-09-16 NOTE — Care Management (Signed)
RNCM met with patient and his daughter Jacqlyn Larsen [phone] regarding Start letter. Patient is from [place]. His PCP is L[place] with [facility]. He is a Veteran of the [place] however he does not receive medical coverage, only life insurance. He has a hired caretaker with him in the home. Jacqlyn Larsen states he is not on O2 at home and she doubts he will agree to going to SNF. Home health list provided. He has received outpatient PT at [facility] in the past.

## 2017-09-16 NOTE — H&P (Signed)
[facility] [place] [facility]   PATIENT NAME: Gary York    MR#:  [id number]  DATE OF BIRTH:  09-Mar-1922  DATE OF ADMISSION:  09/16/2017  PRIMARY CARE PHYSICIAN: Dr Burnadette Pop  REQUESTING/REFERRING PHYSICIAN: DR Lenard Lance  CHIEF COMPLAINT:   Chief Complaint  Patient presents with  . Chest Pain  . Shortness of Breath    HISTORY OF PRESENT ILLNESS:  Gary York  is a 84 y.o. male with a known history of recent myocardial infarction treated with conservative management.  Patient coming in with chest pain and shortness of breath.  He describes the chest pain as a dull tightness in the center of his chest 4 out of 10 in intensity.  Some shortness of breath and cough and wheeze.  In the ER he was hypoxic to 88% and hospitalist services were contacted for further evaluation.  Patient again this time does not want aggressive evaluation or procedures.  PAST MEDICAL HISTORY:   Past Medical History:  Diagnosis Date  . CAD (coronary artery disease)   . Complete heart block (HCC)   . Hyperlipidemia   . Hypertension     PAST SURGICAL HISTORY:   Past Surgical History:  Procedure Laterality Date  . PACEMAKER IMPLANT      SOCIAL HISTORY:   Social History   Tobacco Use  . Smoking status: Former Smoker    Packs/day: 1.00    Years: 20.00    Pack years: 20.00    Types: Cigarettes    Last attempt to quit: 1990    Years since quitting: 29.4  . Smokeless tobacco: Never Used  Substance Use Topics  . Alcohol use: Not Currently    FAMILY HISTORY:   Family History  Problem Relation Age of Onset  . Dementia Mother   . Diabetes Mother   . Dementia Father     DRUG ALLERGIES:  No Known Allergies  REVIEW OF SYSTEMS:  CONSTITUTIONAL: No fever,  chills or sweats.  Positive for fatigue. EYES: No blurred or double vision.  EARS, NOSE, AND THROAT: No tinnitus or ear pain. No sore throat RESPIRATORY: Positive for cough, shortness of breath, and  wheezing. CARDIOVASCULAR: Positive for chest pain,  and edema.  GASTROINTESTINAL: No nausea, vomiting, diarrhea or abdominal pain. No blood in bowel movements GENITOURINARY: No dysuria, hematuria.  ENDOCRINE: No polyuria, nocturia,  HEMATOLOGY: No anemia, easy bruising or bleeding SKIN: No rash or lesion. MUSCULOSKELETAL: Positive for joint pain NEUROLOGIC: No tingling, numbness, weakness.  PSYCHIATRY: Anxiety and depression   MEDICATIONS AT HOME:   Prior to Admission medications   Medication Sig Start Date End Date Taking? Authorizing Provider  aspirin EC 81 MG EC tablet Take 1 tablet (81 mg total) by mouth daily. 09/13/17  Yes Enid Baas, MD  atorvastatin (LIPITOR) 80 MG tablet Take 1 tablet (80 mg total) by mouth daily at 6 PM. 09/12/17  Yes Enid Baas, MD  benzonatate (TESSALON PERLES) 100 MG capsule Take 1 capsule (100 mg total) by mouth 3 (three) times daily for 10 days. 09/12/17 09/22/17 Yes Enid Baas, MD  chlorpheniramine-HYDROcodone (TUSSIONEX) 10-8 MG/5ML SUER Take 5 mLs by mouth every 12 (twelve) hours. 09/12/17  Yes Enid Baas, MD  clopidogrel (PLAVIX) 75 MG tablet Take 1 tablet (75 mg total) by mouth daily. 09/13/17  Yes Enid Baas, MD  diltiazem (DILTIAZEM CD) 180 MG 24 hr capsule Take 180 mg by mouth daily.   Yes [provider]  doxazosin (CARDURA) 2 MG tablet Take 2 mg by  mouth daily. 08/19/17  Yes [provider]  Fluticasone-Salmeterol (ADVAIR DISKUS) 250-50 MCG/DOSE AEPB Inhale 1 puff into the lungs 2 (two) times daily. 09/12/17 09/12/18 Yes Enid Baas, MD  guaiFENesin-codeine 100-10 MG/5ML syrup Take 5 mLs by mouth every 6 (six) hours as needed for cough. 09/03/17  Yes Minna Antis, MD  isosorbide mononitrate (IMDUR) 30 MG 24 hr tablet Take 1 tablet (30 mg total) by mouth daily. 09/13/17  Yes Enid Baas, MD  losartan (COZAAR) 100 MG tablet Take 100 mg by mouth daily. 11/20/16  Yes [provider]  metoprolol tartrate (LOPRESSOR) 25 MG tablet Take 1 tablet (25 mg total) by mouth 2 (two) times daily. 09/12/17  Yes Enid Baas, MD  pantoprazole (PROTONIX) 40 MG tablet Take 1 tablet (40 mg total) by mouth daily. 09/12/17  Yes Enid Baas, MD  albuterol (PROVENTIL HFA;VENTOLIN HFA) 108 (90 Base) MCG/ACT inhaler Inhale 2 puffs into the lungs every 6 (six) hours as needed for wheezing or shortness of breath. 09/12/17   Enid Baas, MD  nitroGLYCERIN (NITROSTAT) 0.4 MG SL tablet Place 1 tablet (0.4 mg total) under the tongue every 5 (five) minutes as needed for chest pain. 09/12/17 11/11/17  Enid Baas, MD      VITAL SIGNS:  Blood pressure (!) 185/94, pulse 76, temperature 97.8 F (36.6 C), temperature source Oral, resp. rate 19, height 5\' 9"  (1.753 m), weight 100.2 kg (221 lb), SpO2 95 %.  PHYSICAL EXAMINATION:  GENERAL:  84 y.o.-year-old patient lying in the bed with no acute distress.  EYES: Pupils equal, round, reactive to light and accommodation. No scleral icterus. Extraocular muscles intact.  HEENT: Head atraumatic, normocephalic. Oropharynx and nasopharynx clear.  NECK:  Supple, no jugular venous distention. No thyroid enlargement, no tenderness.  LUNGS:  decreased breath sounds bilaterally,  upper airway wheezing, bibasilar rales. No use of accessory muscles of respiration.  CARDIOVASCULAR: S1, S2 normal. No murmurs, rubs, or gallops.  ABDOMEN: Soft, nontender, nondistended. Bowel sounds present. No organomegaly or mass.  EXTREMITIES: 2+ pedal edema, no cyanosis, or clubbing.  NEUROLOGIC: Cranial nerves II through XII are intact. Muscle strength 5/5 in all extremities. Sensation intact. Gait not checked.  PSYCHIATRIC: The patient is alert and oriented x 3.  SKIN: No rash, lesion, or ulcer.   LABORATORY PANEL:   CBC Recent Labs  Lab 09/16/17 0647  WBC 12.1*  HGB 12.8*  HCT 36.7*  PLT 191    ------------------------------------------------------------------------------------------------------------------  Chemistries  Recent Labs  Lab 09/09/17 1048  09/16/17 0647  NA 123*   < > 132*  K 4.5   < > 3.5  CL 89*   < > 103  CO2 25   < > 23  GLUCOSE 138*   < > 101*  BUN 21*   < > 11  CREATININE 1.13   < > 0.83  CALCIUM 8.6*   < > 7.6*  AST 33  --   --   ALT 41  --   --   ALKPHOS 43  --   --   BILITOT 0.6  --   --    < > = values in this interval not displayed.   ------------------------------------------------------------------------------------------------------------------  Cardiac Enzymes Recent Labs  Lab 09/16/17 0647  TROPONINI 0.05*   ------------------------------------------------------------------------------------------------------------------  RADIOLOGY:  Dg Chest 2 View  Result Date: 09/16/2017 CLINICAL DATA:  Recent MI. Now with chest pain and shortness of breath with improvement in symptoms after nitroglycerin and aspirin. Former smoker. EXAM: CHEST - 2 VIEW COMPARISON:  CT scan of the chest of September 11, 2017 and [place] and lateral chest x-ray of September 09, 2017. FINDINGS: There remains elevation of the right hemidiaphragm. The interstitial markings are both lungs are increased. The pulmonary vascularity is engorged. The cardiac silhouette is mildly enlarged. The ICD is in stable position. The mediastinum is normal in width. There is calcification in the wall of the thoracic aorta. There small bilateral pleural effusions layering posteriorly. IMPRESSION: CHF superimposed upon COPD. Chronic elevation of the right hemidiaphragm. No acute pneumonia. The appearance of the lungs has deteriorated since the previous study. Electronically Signed   By: David  SwazilandJordan M.D.   On: 09/16/2017 07:29    EKG:   Paced  IMPRESSION AND PLAN:   1.  Acute hypoxic respiratory failure.  Pulse ox 88% on room air.  Oxygen supplementation ordered.  Check pulse ox on a daily  basis. 2.  Acute on chronic diastolic congestive heart failure with recent myocardial infarction.  Looks like he is on good cardiac medications at this time with aspirin, Plavix, metoprolol, Imdur and statin.  Lasix IV ordered.  Patient again wants conservative management.  Spoke with cardiology Dr. Juliann Paresallwood to see the patient.  Add high-dose Lovenox for right now.  Telemetry monitoring and serial cardiac enzymes 3.  COPD exacerbation and low-dose Solu-Medrol and nebulizer treatments with budesonide and DuoNeb. 4.  Hyperlipidemia unspecified send off lipid profile.  Continue statin. 5.  Essential hypertension continue usual medications.  Like to see blood pressure trend a little bit better. 6.  Hyponatremia on last hospital stay.  Sodium looks better today than it did on the last hospitalization.   All the records are reviewed and case discussed with ED provider. Management plans discussed with the patient, family and they are in agreement.  CODE STATUS: DNR  TOTAL TIME TAKING CARE OF THIS PATIENT: 50minutes.    Alford Highlandichard Lenette Rau M.D on 09/16/2017 at 8:48 AM  Between 7am to 6pm - Pager - [phone]  After 6pm call admission pager [phone]  [facility] Office  [phone]  CC: Primary care physician; Dr. Burnadette PopLinthavong

## 2017-09-16 NOTE — ED Notes (Signed)
Pt went to X-Ray.  

## 2017-09-16 NOTE — Progress Notes (Signed)
ANTICOAGULATION CONSULT NOTE - Initial Consult  Pharmacy Consult for LMWH Indication: chest pain/ACS  No Known Allergies  Patient Measurements: Height: 5\' 9"  (175.3 cm) Weight: 221 lb (100.2 kg) IBW/kg (Calculated) : 70.7 Heparin Dosing Weight:   Vital Signs: Temp: 97.8 F (36.6 C) (06/25 0642) Temp Source: Oral (06/25 0642) BP: 185/94 (06/25 0800) Pulse Rate: 76 (06/25 0800)  Labs: Recent Labs    09/16/17 0647  HGB 12.8*  HCT 36.7*  PLT 191  CREATININE 0.83  TROPONINI 0.05*    Estimated Creatinine Clearance: 64.9 mL/min (by C-G formula based on SCr of 0.83 mg/dL).   Medical History: Past Medical History:  Diagnosis Date  . CAD (coronary artery disease)   . Complete heart block (HCC)   . Hyperlipidemia   . Hypertension     Medications:  Infusions:    Assessment: 89 yom cc CP/SOB with PMH complete heart block s/p pace maker, MI 1 week ago, and HTN. Initial troponin 0.05 x 1, EKG with paced rhythm. Pharmacy consulted to dose LMWH for ACS. No PTA OAC noted, CBC and BMP resulted.   Goal of Therapy:  Anti-Xa level 0.6-1 units/ml 4hrs after LMWH dose given Monitor platelets by anticoagulation protocol: Yes   Plan:  Lovenox 1 mg/kg subcutaneously BID. Will check CBC and SCr Q3D per protocol.   Carola FrostNathan A Mykayla Brinton, Pharm.D., BCPS Clinical Pharmacist 09/16/2017,8:48 AM

## 2017-09-17 LAB — BASIC METABOLIC PANEL
ANION GAP: 8 (ref 5–15)
BUN: 17 mg/dL (ref 8–23)
CALCIUM: 8.6 mg/dL — AB (ref 8.9–10.3)
CO2: 27 mmol/L (ref 22–32)
Chloride: 95 mmol/L — ABNORMAL LOW (ref 98–111)
Creatinine, Ser: 1.04 mg/dL (ref 0.61–1.24)
GFR calc Af Amer: >60 mL/min (ref >60)
GFR, EST NON AFRICAN AMERICAN: 60 mL/min — AB (ref >60)
GLUCOSE: 128 mg/dL — AB (ref 70–99)
Potassium: 4 mmol/L (ref 3.5–5.1)
Sodium: 130 mmol/L — ABNORMAL LOW (ref 135–145)

## 2017-09-17 LAB — CBC
HCT: 31.8 % — ABNORMAL LOW (ref 40.0–52.0)
HEMOGLOBIN: 11.3 g/dL — AB (ref 13.0–18.0)
MCH: 35 pg — AB (ref 26.0–34.0)
MCHC: 35.5 g/dL (ref 32.0–36.0)
MCV: 98.4 fL (ref 80.0–100.0)
Platelets: 213 10*3/uL (ref 150–440)
RBC: 3.23 MIL/uL — ABNORMAL LOW (ref 4.40–5.90)
RDW: 13.8 % (ref 11.5–14.5)
WBC: 12.5 10*3/uL — ABNORMAL HIGH (ref 3.8–10.6)

## 2017-09-17 MED ORDER — ISOSORBIDE MONONITRATE ER 60 MG PO TB24
60.0000 mg | ORAL_TABLET | Freq: Every day | ORAL | Status: DC
  Administered 2017-09-18: 60 mg via ORAL
  Filled 2017-09-17: qty 1

## 2017-09-17 MED ORDER — FUROSEMIDE 10 MG/ML IJ SOLN: 20.0000 mg | Freq: Every day | INTRAMUSCULAR | Status: DC

## 2017-09-17 MED ORDER — ENOXAPARIN SODIUM 40 MG/0.4ML ~~LOC~~ SOLN
40.0000 mg | SUBCUTANEOUS | Status: DC
  Administered 2017-09-17: 40 mg via SUBCUTANEOUS
  Filled 2017-09-17: qty 0.4

## 2017-09-17 MED ORDER — GUAIFENESIN-DM 100-10 MG/5ML PO SYRP
5.0000 mL | ORAL_SOLUTION | ORAL | Status: DC | PRN
  Administered 2017-09-17: 5 mL via ORAL
  Filled 2017-09-17: qty 5

## 2017-09-17 MED ORDER — BENZONATATE 100 MG PO CAPS
200.0000 mg | ORAL_CAPSULE | Freq: Three times a day (TID) | ORAL | Status: DC | PRN
  Administered 2017-09-17: 200 mg via ORAL
  Filled 2017-09-17: qty 2

## 2017-09-17 NOTE — Progress Notes (Signed)
Pt refusing bed alarm. Educated. 

## 2017-09-17 NOTE — Progress Notes (Signed)
Patient ID: GIOVANNI BIBY, male   DOB: [birth year], 84 y.o.   MRN: [id number]  [facility] PROGRESS NOTE  QUINDARRIUS JOPLIN WUJ:[id number] DOB: 1922-01-05 DOA: 09/16/2017 PCP: Patient, No Pcp Per  HPI/Subjective: Patient sleepy today.  Had some upper airway congestion.   Objective: Vitals:   09/17/17 0324 09/17/17 0742  BP: 95/81 (!) 148/76  Pulse: 75 75  Resp: 19   Temp: 97.9 F (36.6 C) 97.8 F (36.6 C)  SpO2: 96% 93%    Filed Weights   09/16/17 0645  Weight: 100.2 kg (221 lb)    ROS: Review of Systems  Constitutional: Negative for chills and fever.  Eyes: Negative for blurred vision.  Respiratory: Positive for cough and shortness of breath.   Cardiovascular: Negative for chest pain.  Gastrointestinal: Negative for abdominal pain, constipation, diarrhea, nausea and vomiting.  Genitourinary: Negative for dysuria.  Musculoskeletal: Negative for joint pain.  Neurological: Negative for dizziness and headaches.   Exam: Physical Exam  HENT:  Nose: No mucosal edema.  Mouth/Throat: No oropharyngeal exudate or posterior oropharyngeal edema.  Eyes: Pupils are equal, round, and reactive to light. Conjunctivae, EOM and lids are normal.  Neck: No JVD present. Carotid bruit is not present. No edema present. No thyroid mass and no thyromegaly present.  Cardiovascular: S1 normal and S2 normal. Exam reveals no gallop.  No murmur heard. Pulses:      Dorsalis pedis pulses are 2+ on the right side, and 2+ on the left side.  Respiratory: No respiratory distress. He has decreased breath sounds in the right lower field and the left lower field. He has no wheezes. He has no rhonchi. He has no rales.  Upper airway transmitted wheeze  GI: Soft. Bowel sounds are normal. There is no tenderness.  Musculoskeletal:       Right ankle: He exhibits swelling.       Left ankle: He exhibits swelling.  Lymphadenopathy:    He has no cervical adenopathy.  Neurological: He is alert.  Skin: Skin is  warm. No rash noted. Nails show no clubbing.  Psychiatric: He has a normal mood and affect.      Data Reviewed: Basic Metabolic Panel: Recent Labs  Lab 09/11/17 0143  09/12/17 0549 09/12/17 1039 09/12/17 1718 09/16/17 0647 09/17/17 0417  NA 127*   < > 128* 126* 129* 132* 130*  K 4.5  --  4.5 4.3  --  3.5 4.0  CL 97*  --  99* 94*  --  103 95*  CO2 24  --  22 25  --  23 27  GLUCOSE 149*  --  99 118*  --  101* 128*  BUN 16  --  11 11  --  11 17  CREATININE 1.04  --  0.92 0.97  --  0.83 1.04  CALCIUM 8.1*  --  8.0* 8.2*  --  7.6* 8.6*   < > = values in this interval not displayed.   CBC: Recent Labs  Lab 09/11/17 0143 09/12/17 0549 09/16/17 0647 09/17/17 0417  WBC 9.2 9.5 12.1* 12.5*  HGB 11.8* 11.9* 12.8* 11.3*  HCT 33.1* 32.8* 36.7* 31.8*  MCV 99.3 96.8 99.3 98.4  PLT 173 143* 191 213   Cardiac Enzymes: Recent Labs  Lab 09/16/17 0647 09/16/17 1032 09/16/17 1509  TROPONINI 0.05* 0.05* 0.04*    CBG: Recent Labs  Lab 09/12/17 1731  GLUCAP 113*     Studies: Dg Chest 2 View  Result Date: 09/16/2017 CLINICAL DATA:  Recent  MI. Now with chest pain and shortness of breath with improvement in symptoms after nitroglycerin and aspirin. Former smoker. EXAM: CHEST - 2 VIEW COMPARISON:  CT scan of the chest of September 11, 2017 and [place] and lateral chest x-ray of September 09, 2017. FINDINGS: There remains elevation of the right hemidiaphragm. The interstitial markings are both lungs are increased. The pulmonary vascularity is engorged. The cardiac silhouette is mildly enlarged. The ICD is in stable position. The mediastinum is normal in width. There is calcification in the wall of the thoracic aorta. There small bilateral pleural effusions layering posteriorly. IMPRESSION: CHF superimposed upon COPD. Chronic elevation of the right hemidiaphragm. No acute pneumonia. The appearance of the lungs has deteriorated since the previous study. Electronically Signed   By: David  SwazilandJordan M.D.    On: 09/16/2017 07:29    Scheduled Meds: . aspirin EC  81 mg Oral Daily  . atorvastatin  80 mg Oral q1800  . budesonide (PULMICORT) nebulizer solution  0.5 mg Nebulization BID  . clopidogrel  75 mg Oral Daily  . diltiazem  180 mg Oral Daily  . doxazosin  2 mg Oral Daily  . enoxaparin (LOVENOX) injection  40 mg Subcutaneous Q24H  . furosemide  20 mg Intravenous BID  . ipratropium-albuterol  3 mL Nebulization Q6H  . [START ON 09/18/2017] isosorbide mononitrate  60 mg Oral Daily  . losartan  100 mg Oral Daily  . methylPREDNISolone (SOLU-MEDROL) injection  40 mg Intravenous Daily  . metoprolol tartrate  25 mg Oral BID  . pantoprazole  40 mg Oral Daily   Continuous Infusions:  Assessment/Plan:  1. Acute hypoxic respiratory failure.  Initial pulse ox 88% on room air.  Patient now off oxygen. 2. COPD exacerbation.  Continue low-dose Solu-Medrol and nebulizer treatments with budesonide nebulizers and DuoNeb.  Still has some upper airway wheeze and like to evaluate tomorrow.  Hopefully can discharge tomorrow. 3. Acute on chronic diastolic congestive heart failure with recent myocardial infarction.  Continue IV Lasix today.  Patient already on good cardiac medications. 4. Recent myocardial infarction.  On aspirin Plavix metoprolol increased dose of Imdur.  And statin 5. Hyperlipidemia unspecified.  Cholesterol profile acceptable on statin 6. Essential hypertension.  Blood pressure little variable during the hospital stay. 7. Hyponatremia.  Continue to monitor with diuresis.  Code Status:     Code Status Orders  (From admission, onward)        Start     Ordered   09/16/17 0843  Do not attempt resuscitation (DNR)  Continuous    Question Answer Comment  In the event of cardiac or respiratory ARREST Do not call a "code blue"   In the event of cardiac or respiratory ARREST Do not perform Intubation, CPR, defibrillation or ACLS   In the event of cardiac or respiratory ARREST Use medication  by any route, position, wound care, and other measures to relive pain and suffering. May use oxygen, suction and manual treatment of airway obstruction as needed for comfort.   Comments RN may pronounce      09/16/17 0842    Code Status History    Date Active Date Inactive Code Status Order ID Comments User Context   09/09/2017 1343 09/12/2017 2357 DNR [id number]  Ramonita LabGouru, Aruna, MD Inpatient   09/09/2017 1305 09/09/2017 1343 DNR [id number]  Ramonita LabGouru, Aruna, MD ED    Advance Directive Documentation     Most Recent Value  Type of Advance Directive  Out of facility DNR (pink MOST or  yellow form)  Pre-existing out of facility DNR order (yellow form or pink MOST form)  Yellow form placed in chart (order not valid for inpatient use)  "MOST" Form in Place?  -     Family Communication: Family at bedside Disposition Plan: Potential discharge tomorrow  Consultants: - Cardiology  Time spent: 28 minutes  Audrick Lamoureaux [employer]

## 2017-09-17 NOTE — Progress Notes (Signed)
PT Cancellation Note  Patient Details Name: Gary York MRN: [id number] DOB: 07-19-1921   Cancelled Treatment:    Reason Eval/Treat Not Completed: PT screened, no needs identified, will sign off.  Pt familiar to this PT and was ambulating with supervision level last week.  Pt sleeping upon PT arrival but per daughter in the law the pt has not had an new instability or difficulties with mobility since last seen by this PT.  No skilled PT needs identified, PT will sign off.  Expressed recommendation for pt to receive Cardiopulmonary Rehab at d/c but the daughter in law believes that the pt will refuse this. Encouraged pt to ambulate at least 3/day with the nursing staff during hospital stay.  Daughter in law verbalized understanding and was very Adult nurseappreciative.     Encarnacion ChuAshley Abashian PT, DPT 09/17/2017, 12:51 PM

## 2017-09-17 NOTE — Progress Notes (Signed)
Provided patient with "Living Better with Heart Failure" packet. Briefly reviewed definition of heart failure and signs and symptoms of an exacerbation. Reviewed importance of and reason behind checking weight daily in the AM, after using the bathroom, but before getting dressed. Discussed when to call the Dr= weight gain of >2lb overnight of 5lb in a week,  Discussed yellow zone= call MD: weight gain of >2lb overnight of 5lb in a week, increased swelling, increased SOB when lying down, chest discomfort, dizziness, increased fatigue Red Zone= call 911: struggle to breath, fainting or near fainting, significant chest pain Reviewed low sodium diet <2g/day-provided handout of recommended and not recommended foods  Fluid restriction <2L/day Reviewed how to read nutrition label Reviewed medication changes: possible addition of Lasix on discharge Explained briefly why pt is on the medications (either make you feel better, live longer or keep you out of the hospital) and discussed monitoring and side effects  Discussed tobacco cessation: N/A  Discussed exercise:   Luisa HartScott Emelynn Rance, PharmD Clinical Pharmacist

## 2017-09-18 MED ORDER — POLYETHYLENE GLYCOL 3350 17 G PO PACK
17.0000 g | PACK | Freq: Every day | ORAL | Status: DC
  Administered 2017-09-18: 17 g via ORAL
  Filled 2017-09-18: qty 1

## 2017-09-18 MED ORDER — ISOSORBIDE MONONITRATE ER 60 MG PO TB24: 60.0000 mg | ORAL_TABLET | Freq: Every day | ORAL | 0 refills | Status: DC

## 2017-09-18 MED ORDER — LEVALBUTEROL HCL 1.25 MG/0.5ML IN NEBU: 1.2500 mg | INHALATION_SOLUTION | Freq: Four times a day (QID) | RESPIRATORY_TRACT | 0 refills | Status: DC | PRN

## 2017-09-18 MED ORDER — FUROSEMIDE 20 MG PO TABS
20.0000 mg | ORAL_TABLET | Freq: Every day | ORAL | Status: DC
  Administered 2017-09-18: 20 mg via ORAL
  Filled 2017-09-18: qty 1

## 2017-09-18 MED ORDER — FUROSEMIDE 20 MG PO TABS: 20.0000 mg | ORAL_TABLET | Freq: Every day | ORAL | 0 refills | Status: DC

## 2017-09-18 MED ORDER — POLYETHYLENE GLYCOL 3350 17 G PO PACK: 17.0000 g | PACK | Freq: Every day | ORAL | 0 refills | Status: DC | PRN

## 2017-09-18 MED ORDER — PREDNISONE 10 MG PO TABS: ORAL_TABLET | ORAL | 0 refills | Status: DC

## 2017-09-18 MED ORDER — ALBUTEROL SULFATE (2.5 MG/3ML) 0.083% IN NEBU: 2.5000 mg | INHALATION_SOLUTION | Freq: Four times a day (QID) | RESPIRATORY_TRACT | 0 refills | Status: DC | PRN

## 2017-09-18 NOTE — Discharge Summary (Signed)
[facility] - [place] at [facility]   PATIENT NAME: Gary York    MR#:  [id number]  DATE OF BIRTH:  [birth year]  DATE OF ADMISSION:  09/16/2017 ADMITTING PHYSICIAN: Alford Highlandichard Rameses Ou, MD  DATE OF DISCHARGE: 09/18/2017 12:38 PM  PRIMARY CARE PHYSICIAN: Marisue IvanLinthavong, Kanhka, MD    ADMISSION DIAGNOSIS:  Acute on chronic congestive heart failure, unspecified heart failure type (HCC) [I50.9]  DISCHARGE DIAGNOSIS:  Active Problems:   CHF (congestive heart failure) (HCC)   SECONDARY DIAGNOSIS:   Past Medical History:  Diagnosis Date  . CAD (coronary artery disease)   . Complete heart block (HCC)   . Hyperlipidemia   . Hypertension     HOSPITAL COURSE:   1.  Acute hypoxic respiratory failure.  Initial pulse ox of 88% on room air when the patient presented back to the emergency room.  The patient was treated for COPD and CHF and patient now breathing comfortably on room air. 2.  COPD exacerbation.  The patient was given Solu-Medrol while here in the hospital and nebulizer treatments with budesonide and DuoNeb.  The patient has some upper airway congestion.  I prescribed albuterol nebulizer upon going home but this was not covered by the pharmacy and I switch that over to Xopenex which was covered.  The patient has an albuterol inhaler and Advair at home. 3.  Acute on chronic diastolic congestive heart failure with recent myocardial infarction.  The patient was given IV Lasix and diuresed well.  Switched over to oral Lasix upon going home.  I think this was more COPD rather than CHF.  Patient referred to the CHF clinic but I am not sure if he will go.  Home health RN set up. 4.  Recent myocardial infarction.  On aspirin, Plavix, metoprolol and increased dose of Imdur.  Also on atorvastatin.  Patient wanted conservative management and no intervention. 5.  Hyperlipidemia unspecified.  Cholesterol profile acceptable on statin 6.  Essential hypertension.  Continue metoprolol 7.   Hyponatremia.  Monitor closely with diuresis.  Sodium upon discharge 130.  DISCHARGE CONDITIONS:   Fair  CONSULTS OBTAINED:  Treatment Team:  Alwyn Peaallwood, Dwayne D, MD  DRUG ALLERGIES:  No Known Allergies  DISCHARGE MEDICATIONS:   Allergies as of 09/18/2017   No Known Allergies     Medication List    STOP taking these medications   chlorpheniramine-HYDROcodone 10-8 MG/5ML Suer Commonly known as:  TUSSIONEX   guaiFENesin-codeine 100-10 MG/5ML syrup     TAKE these medications   albuterol 108 (90 Base) MCG/ACT inhaler Commonly known as:  PROVENTIL HFA;VENTOLIN HFA Inhale 2 puffs into the lungs every 6 (six) hours as needed for wheezing or shortness of breath. What changed:  Another medication with the same name was added. Make sure you understand how and when to take each.   albuterol (2.5 MG/3ML) 0.083% nebulizer solution Commonly known as:  PROVENTIL Take 3 mLs (2.5 mg total) by nebulization every 6 (six) hours as needed for wheezing or shortness of breath. Dx: copd exacerbation J44.1 What changed:  You were already taking a medication with the same name, and this prescription was added. Make sure you understand how and when to take each.   aspirin 81 MG EC tablet Take 1 tablet (81 mg total) by mouth daily.   atorvastatin 80 MG tablet Commonly known as:  LIPITOR Take 1 tablet (80 mg total) by mouth daily at 6 PM.   benzonatate 100 MG capsule Commonly known as:  TESSALON PERLES Take 1  capsule (100 mg total) by mouth 3 (three) times daily for 10 days.   clopidogrel 75 MG tablet Commonly known as:  PLAVIX Take 1 tablet (75 mg total) by mouth daily.   DILTIAZEM CD 180 MG 24 hr capsule Generic drug:  diltiazem Take 180 mg by mouth daily.   doxazosin 2 MG tablet Commonly known as:  CARDURA Take 2 mg by mouth daily.   Fluticasone-Salmeterol 250-50 MCG/DOSE Aepb Commonly known as:  ADVAIR DISKUS Inhale 1 puff into the lungs 2 (two) times daily.   furosemide 20 MG  tablet Commonly known as:  LASIX Take 1 tablet (20 mg total) by mouth daily. Start taking on:  09/19/2017   isosorbide mononitrate 60 MG 24 hr tablet Commonly known as:  IMDUR Take 1 tablet (60 mg total) by mouth daily. Start taking on:  09/19/2017 What changed:    medication strength  how much to take   levalbuterol 1.25 MG/0.5ML nebulizer solution Commonly known as:  XOPENEX Take 1.25 mg by nebulization every 6 (six) hours as needed for wheezing or shortness of breath. Copd exacerbation V44.1   losartan 100 MG tablet Commonly known as:  COZAAR Take 100 mg by mouth daily.   metoprolol tartrate 25 MG tablet Commonly known as:  LOPRESSOR Take 1 tablet (25 mg total) by mouth 2 (two) times daily.   nitroGLYCERIN 0.4 MG SL tablet Commonly known as:  NITROSTAT Place 1 tablet (0.4 mg total) under the tongue every 5 (five) minutes as needed for chest pain.   pantoprazole 40 MG tablet Commonly known as:  PROTONIX Take 1 tablet (40 mg total) by mouth daily.   polyethylene glycol packet Commonly known as:  MIRALAX / GLYCOLAX Take 17 g by mouth daily as needed.   predniSONE 10 MG tablet Commonly known as:  DELTASONE Two tablets po daily for 2 days then stop        DISCHARGE INSTRUCTIONS:   Follow-up PMD 6 days  If you experience worsening of your admission symptoms, develop shortness of breath, life threatening emergency, suicidal or homicidal thoughts you must seek medical attention immediately by calling 911 or calling your MD immediately  if symptoms less severe.  You Must read complete instructions/literature along with all the possible adverse reactions/side effects for all the Medicines you take and that have been prescribed to you. Take any new Medicines after you have completely understood and accept all the possible adverse reactions/side effects.   Please note  You were cared for by a hospitalist during your hospital stay. If you have any questions about your  discharge medications or the care you received while you were in the hospital after you are discharged, you can call the unit and asked to speak with the hospitalist on call if the hospitalist that took care of you is not available. Once you are discharged, your primary care physician will handle any further medical issues. Please note that NO REFILLS for any discharge medications will be authorized once you are discharged, as it is imperative that you return to your primary care physician (or establish a relationship with a primary care physician if you do not have one) for your aftercare needs so that they can reassess your need for medications and monitor your lab values.    Today   CHIEF COMPLAINT:   Chief Complaint  Patient presents with  . Chest Pain  . Shortness of Breath    HISTORY OF PRESENT ILLNESS:  Gary York  is a 84 y.o. male  recent myocardial infarction presented back to the hospital with shortness of breath   VITAL SIGNS:  Blood pressure (!) 153/78, pulse 70, temperature 98.8 F (37.1 C), temperature source Oral, resp. rate 20, height 5\' 9"  (1.753 m), weight 100.8 kg (222 lb 4.8 oz), SpO2 94 %.    PHYSICAL EXAMINATION:  GENERAL:  84 y.o.-year-old patient lying in the bed with no acute distress.  EYES: Pupils equal, round, reactive to light and accommodation. No scleral icterus. Extraocular muscles intact.  HEENT: Head atraumatic, normocephalic. Oropharynx and nasopharynx clear.  NECK:  Supple, no jugular venous distention. No thyroid enlargement, no tenderness.  LUNGS: Normal breath sounds bilaterally, upper airway transmitted wheeze.  No rales,rhonchi or crepitation. No use of accessory muscles of respiration.  CARDIOVASCULAR: S1, S2 normal. No murmurs, rubs, or gallops.  ABDOMEN: Soft, non-tender, non-distended. Bowel sounds present. No organomegaly or mass.  EXTREMITIES: Trace pedal edema, no cyanosis, or clubbing.  NEUROLOGIC: Cranial nerves II through XII are  intact. Muscle strength 5/5 in all extremities. Sensation intact. Gait not checked.  PSYCHIATRIC: The patient is alert and oriented x 3.  SKIN: No obvious rash, lesion, or ulcer.   DATA REVIEW:   CBC Recent Labs  Lab 09/17/17 0417  WBC 12.5*  HGB 11.3*  HCT 31.8*  PLT 213    Chemistries  Recent Labs  Lab 09/17/17 0417  NA 130*  K 4.0  CL 95*  CO2 27  GLUCOSE 128*  BUN 17  CREATININE 1.04  CALCIUM 8.6*    Cardiac Enzymes Recent Labs  Lab 09/16/17 1509  TROPONINI 0.04*      Management plans discussed with the patient, family and they are in agreement.  CODE STATUS:  Code Status History    Date Active Date Inactive Code Status Order ID Comments User Context   09/16/2017 0842 09/18/2017 1539 DNR [id number]  Alford Highland, MD ED   09/09/2017 1343 09/12/2017 2357 DNR [id number]  Ramonita Lab, MD Inpatient   09/09/2017 1305 09/09/2017 1343 DNR [id number]  Ramonita Lab, MD ED    Questions for Most Recent Historical Code Status (Order [id number])    Question Answer Comment   In the event of cardiac or respiratory ARREST Do not call a "code blue"    In the event of cardiac or respiratory ARREST Do not perform Intubation, CPR, defibrillation or ACLS    In the event of cardiac or respiratory ARREST Use medication by any route, position, wound care, and other measures to relive pain and suffering. May use oxygen, suction and manual treatment of airway obstruction as needed for comfort.    Comments RN may pronounce         Advance Directive Documentation     Most Recent Value  Type of Advance Directive  Out of facility DNR (pink MOST or yellow form)  Pre-existing out of facility DNR order (yellow form or pink MOST form)  Yellow form placed in chart (order not valid for inpatient use)  "MOST" Form in Place?  -      TOTAL TIME TAKING CARE OF THIS PATIENT: 35 minutes.    Alford Highland M.D on 09/18/2017 at 4:50 PM  Between 7am to 6pm - Pager - [phone]  After 6pm  go to [email or web address] - password [email or web address]  [facility] Office  [phone]  CC: Primary care physician; Marisue Ivan, MD

## 2017-09-18 NOTE — Care Management (Signed)
Patient discharging home today. he does not require hone oxygen.  Patient is going to need a home nebulizer.  Discussed with patient's daughter and agency preference is [facility].  Provided her with the prescription and she will take it to the retail store after she gets patient home and settled as he is getting very agitated over leaving soon.  Agreeable to home health nurse and agency preference is [facility].  Referral accepted.

## 2017-09-18 NOTE — Plan of Care (Signed)
  Problem: Education: Goal: Knowledge of General Education information will improve Outcome: Progressing   Problem: Health Behavior/Discharge Planning: Goal: Ability to manage health-related needs will improve Outcome: Progressing   Problem: Clinical Measurements: Goal: Ability to maintain clinical measurements within normal limits will improve Outcome: Progressing Goal: Will remain free from infection Outcome: Progressing Goal: Diagnostic test results will improve Outcome: Progressing Goal: Respiratory complications will improve Outcome: Progressing Goal: Cardiovascular complication will be avoided Outcome: Progressing   Problem: Activity: Goal: Risk for activity intolerance will decrease Outcome: Progressing   Problem: Nutrition: Goal: Adequate nutrition will be maintained Outcome: Progressing   Problem: Coping: Goal: Level of anxiety will decrease Outcome: Progressing   Problem: Elimination: Goal: Will not experience complications related to bowel motility Outcome: Progressing Goal: Will not experience complications related to urinary retention Outcome: Progressing   Problem: Pain Managment: Goal: General experience of comfort will improve Outcome: Progressing   Problem: Safety: Goal: Ability to remain free from injury will improve Outcome: Progressing   Problem: Skin Integrity: Goal: Risk for impaired skin integrity will decrease Outcome: Progressing   Problem: Education: Goal: Ability to demonstrate management of disease process will improve Outcome: Progressing Goal: Ability to verbalize understanding of medication therapies will improve Outcome: Progressing   Problem: Activity: Goal: Capacity to carry out activities will improve Outcome: Progressing   Problem: Cardiac: Goal: Ability to achieve and maintain adequate cardiopulmonary perfusion will improve Outcome: Progressing   

## 2017-09-18 NOTE — Progress Notes (Signed)
Patient is discharge  in a stable in a wheel chair , summary and f/u care given ,verbalized understanding , left with daughter

## 2017-09-18 NOTE — Plan of Care (Signed)
  Problem: Skin Integrity: Goal: Risk for impaired skin integrity will decrease Outcome: Not Progressing  Refuses bed alarm

## 2017-10-10 ENCOUNTER — Ambulatory Visit: Payer: Medicare Other | Admitting: Family

## 2018-06-11 ENCOUNTER — Encounter: Payer: Self-pay | Admitting: Emergency Medicine

## 2018-06-11 ENCOUNTER — Inpatient Hospital Stay (HOSPITAL_COMMUNITY)
Admit: 2018-06-11 | Discharge: 2018-06-11 | Disposition: A | Discharge status: Home / Self Care | Payer: Medicare Other | Attending: Internal Medicine | Admitting: Internal Medicine

## 2018-06-11 ENCOUNTER — Inpatient Hospital Stay
Admission: EM | Admit: 2018-06-11 | Discharge: 2018-06-14 | DRG: 190 | Disposition: A | Discharge status: Home Health | Payer: Medicare Other | Source: Skilled Nursing Facility | Attending: Internal Medicine | Admitting: Internal Medicine

## 2018-06-11 ENCOUNTER — Emergency Department: Payer: Medicare Other

## 2018-06-11 ENCOUNTER — Other Ambulatory Visit: Payer: Self-pay

## 2018-06-11 DIAGNOSIS — I252 Old myocardial infarction: Secondary | ICD-10-CM

## 2018-06-11 DIAGNOSIS — J9601 Acute respiratory failure with hypoxia: Secondary | ICD-10-CM | POA: Diagnosis present

## 2018-06-11 DIAGNOSIS — I5033 Acute on chronic diastolic (congestive) heart failure: Secondary | ICD-10-CM | POA: Diagnosis present

## 2018-06-11 DIAGNOSIS — R079 Chest pain, unspecified: Secondary | ICD-10-CM

## 2018-06-11 DIAGNOSIS — Z7982 Long term (current) use of aspirin: Secondary | ICD-10-CM | POA: Diagnosis not present

## 2018-06-11 DIAGNOSIS — Z79899 Other long term (current) drug therapy: Secondary | ICD-10-CM

## 2018-06-11 DIAGNOSIS — J310 Chronic rhinitis: Secondary | ICD-10-CM | POA: Diagnosis present

## 2018-06-11 DIAGNOSIS — I2510 Atherosclerotic heart disease of native coronary artery without angina pectoris: Secondary | ICD-10-CM | POA: Diagnosis present

## 2018-06-11 DIAGNOSIS — E785 Hyperlipidemia, unspecified: Secondary | ICD-10-CM | POA: Diagnosis present

## 2018-06-11 DIAGNOSIS — I340 Nonrheumatic mitral (valve) insufficiency: Secondary | ICD-10-CM

## 2018-06-11 DIAGNOSIS — E871 Hypo-osmolality and hyponatremia: Secondary | ICD-10-CM | POA: Diagnosis present

## 2018-06-11 DIAGNOSIS — I509 Heart failure, unspecified: Secondary | ICD-10-CM

## 2018-06-11 DIAGNOSIS — R0902 Hypoxemia: Secondary | ICD-10-CM

## 2018-06-11 DIAGNOSIS — Z833 Family history of diabetes mellitus: Secondary | ICD-10-CM

## 2018-06-11 DIAGNOSIS — J441 Chronic obstructive pulmonary disease with (acute) exacerbation: Secondary | ICD-10-CM | POA: Diagnosis not present

## 2018-06-11 DIAGNOSIS — I442 Atrioventricular block, complete: Secondary | ICD-10-CM | POA: Diagnosis present

## 2018-06-11 DIAGNOSIS — I110 Hypertensive heart disease with heart failure: Secondary | ICD-10-CM | POA: Diagnosis present

## 2018-06-11 DIAGNOSIS — Z87891 Personal history of nicotine dependence: Secondary | ICD-10-CM

## 2018-06-11 DIAGNOSIS — Z7951 Long term (current) use of inhaled steroids: Secondary | ICD-10-CM | POA: Diagnosis not present

## 2018-06-11 DIAGNOSIS — Z7902 Long term (current) use of antithrombotics/antiplatelets: Secondary | ICD-10-CM | POA: Diagnosis not present

## 2018-06-11 DIAGNOSIS — Z818 Family history of other mental and behavioral disorders: Secondary | ICD-10-CM | POA: Diagnosis not present

## 2018-06-11 DIAGNOSIS — Z66 Do not resuscitate: Secondary | ICD-10-CM | POA: Diagnosis present

## 2018-06-11 DIAGNOSIS — I5031 Acute diastolic (congestive) heart failure: Secondary | ICD-10-CM

## 2018-06-11 DIAGNOSIS — R142 Eructation: Secondary | ICD-10-CM | POA: Diagnosis not present

## 2018-06-11 DIAGNOSIS — Z950 Presence of cardiac pacemaker: Secondary | ICD-10-CM | POA: Diagnosis not present

## 2018-06-11 LAB — CBC WITH DIFFERENTIAL/PLATELET
Abs Immature Granulocytes: 0.05 10*3/uL (ref 0.00–0.07)
BASOS ABS: 0.1 10*3/uL (ref 0.0–0.1)
BASOS PCT: 1 %
Eosinophils Absolute: 0.2 10*3/uL (ref 0.0–0.5)
Eosinophils Relative: 2 %
HCT: 32.2 % — ABNORMAL LOW (ref 39.0–52.0)
Hemoglobin: 11 g/dL — ABNORMAL LOW (ref 13.0–17.0)
IMMATURE GRANULOCYTES: 1 %
Lymphocytes Relative: 14 %
Lymphs Abs: 1.1 10*3/uL (ref 0.7–4.0)
MCH: 33.4 pg (ref 26.0–34.0)
MCHC: 34.2 g/dL (ref 30.0–36.0)
MCV: 97.9 fL (ref 80.0–100.0)
MONO ABS: 0.5 10*3/uL (ref 0.1–1.0)
Monocytes Relative: 6 %
NEUTROS ABS: 6 10*3/uL (ref 1.7–7.7)
NEUTROS PCT: 76 %
NRBC: 0 % (ref 0.0–0.2)
PLATELETS: 165 10*3/uL (ref 150–400)
RBC: 3.29 MIL/uL — AB (ref 4.22–5.81)
RDW: 13.2 % (ref 11.5–15.5)
WBC: 7.9 10*3/uL (ref 4.0–10.5)

## 2018-06-11 LAB — COMPREHENSIVE METABOLIC PANEL
ALT: 18 U/L (ref 0–44)
ANION GAP: 7 (ref 5–15)
AST: 22 U/L (ref 15–41)
Albumin: 3.9 g/dL (ref 3.5–5.0)
Alkaline Phosphatase: 44 U/L (ref 38–126)
BILIRUBIN TOTAL: 0.9 mg/dL (ref 0.3–1.2)
BUN: 12 mg/dL (ref 8–23)
CHLORIDE: 97 mmol/L — AB (ref 98–111)
CO2: 25 mmol/L (ref 22–32)
Calcium: 8.4 mg/dL — ABNORMAL LOW (ref 8.9–10.3)
Creatinine, Ser: 0.88 mg/dL (ref 0.61–1.24)
Glucose, Bld: 136 mg/dL — ABNORMAL HIGH (ref 70–99)
POTASSIUM: 3.8 mmol/L (ref 3.5–5.1)
Sodium: 129 mmol/L — ABNORMAL LOW (ref 135–145)
TOTAL PROTEIN: 6.5 g/dL (ref 6.5–8.1)

## 2018-06-11 LAB — RESPIRATORY PANEL BY PCR
Adenovirus: NOT DETECTED
Bordetella pertussis: NOT DETECTED
CORONAVIRUS 229E-RVPPCR: NOT DETECTED
CORONAVIRUS OC43-RVPPCR: NOT DETECTED
Chlamydophila pneumoniae: NOT DETECTED
Coronavirus HKU1: NOT DETECTED
Coronavirus NL63: NOT DETECTED
INFLUENZA B-RVPPCR: NOT DETECTED
Influenza A: NOT DETECTED
MYCOPLASMA PNEUMONIAE-RVPPCR: NOT DETECTED
Metapneumovirus: NOT DETECTED
Parainfluenza Virus 1: NOT DETECTED
Parainfluenza Virus 2: NOT DETECTED
Parainfluenza Virus 3: NOT DETECTED
Parainfluenza Virus 4: NOT DETECTED
RESPIRATORY SYNCYTIAL VIRUS-RVPPCR: NOT DETECTED
Rhinovirus / Enterovirus: NOT DETECTED

## 2018-06-11 LAB — ECHOCARDIOGRAM COMPLETE
HEIGHTINCHES: 69 in
WEIGHTICAEL: 3360 [oz_av]

## 2018-06-11 LAB — INFLUENZA PANEL BY PCR (TYPE A & B)
INFLAPCR: NEGATIVE
Influenza B By PCR: NEGATIVE

## 2018-06-11 LAB — BRAIN NATRIURETIC PEPTIDE: B NATRIURETIC PEPTIDE 5: 295 pg/mL — AB (ref 0.0–100.0)

## 2018-06-11 LAB — TROPONIN I
Troponin I: <0.03 ng/mL (ref <0.03)
Troponin I: <0.03 ng/mL (ref <0.03)

## 2018-06-11 MED ORDER — ENOXAPARIN SODIUM 40 MG/0.4ML ~~LOC~~ SOLN
40.0000 mg | SUBCUTANEOUS | Status: DC
  Administered 2018-06-11 – 2018-06-14 (×4): 40 mg via SUBCUTANEOUS
  Filled 2018-06-11 (×4): qty 0.4

## 2018-06-11 MED ORDER — IPRATROPIUM-ALBUTEROL 0.5-2.5 (3) MG/3ML IN SOLN
3.0000 mL | Freq: Once | RESPIRATORY_TRACT | Status: AC
  Administered 2018-06-11: 3 mL via RESPIRATORY_TRACT
  Filled 2018-06-11: qty 3

## 2018-06-11 MED ORDER — PANTOPRAZOLE SODIUM 40 MG PO TBEC
40.0000 mg | DELAYED_RELEASE_TABLET | Freq: Every day | ORAL | Status: DC
  Administered 2018-06-12 – 2018-06-14 (×3): 40 mg via ORAL
  Filled 2018-06-11 (×3): qty 1

## 2018-06-11 MED ORDER — ACETAMINOPHEN 325 MG PO TABS: 650.0000 mg | ORAL_TABLET | Freq: Four times a day (QID) | ORAL | Status: DC | PRN

## 2018-06-11 MED ORDER — DOXAZOSIN MESYLATE 2 MG PO TABS
2.0000 mg | ORAL_TABLET | Freq: Every day | ORAL | Status: DC
  Administered 2018-06-12 – 2018-06-14 (×3): 2 mg via ORAL
  Filled 2018-06-11 (×3): qty 1

## 2018-06-11 MED ORDER — LOSARTAN POTASSIUM 50 MG PO TABS
100.0000 mg | ORAL_TABLET | Freq: Every day | ORAL | Status: DC
  Administered 2018-06-12 – 2018-06-14 (×3): 100 mg via ORAL
  Filled 2018-06-11 (×3): qty 2

## 2018-06-11 MED ORDER — FUROSEMIDE 10 MG/ML IJ SOLN
20.0000 mg | Freq: Two times a day (BID) | INTRAMUSCULAR | Status: DC
  Administered 2018-06-11 – 2018-06-14 (×6): 20 mg via INTRAVENOUS
  Filled 2018-06-11 (×6): qty 2

## 2018-06-11 MED ORDER — ISOSORBIDE MONONITRATE ER 60 MG PO TB24
60.0000 mg | ORAL_TABLET | Freq: Every day | ORAL | Status: DC
  Administered 2018-06-11 – 2018-06-14 (×4): 60 mg via ORAL
  Filled 2018-06-11 (×4): qty 1

## 2018-06-11 MED ORDER — ALBUTEROL SULFATE (2.5 MG/3ML) 0.083% IN NEBU
2.5000 mg | INHALATION_SOLUTION | Freq: Four times a day (QID) | RESPIRATORY_TRACT | Status: DC | PRN
  Administered 2018-06-12 – 2018-06-14 (×4): 2.5 mg via RESPIRATORY_TRACT
  Filled 2018-06-11 (×4): qty 3

## 2018-06-11 MED ORDER — METOPROLOL TARTRATE 25 MG PO TABS
25.0000 mg | ORAL_TABLET | Freq: Two times a day (BID) | ORAL | Status: DC
  Administered 2018-06-11 – 2018-06-14 (×7): 25 mg via ORAL
  Filled 2018-06-11 (×7): qty 1

## 2018-06-11 MED ORDER — MAGNESIUM HYDROXIDE 400 MG/5ML PO SUSP: 30.0000 mL | Freq: Every evening | ORAL | Status: DC | PRN

## 2018-06-11 MED ORDER — POLYETHYLENE GLYCOL 3350 17 G PO PACK
17.0000 g | PACK | Freq: Every day | ORAL | Status: DC | PRN
  Administered 2018-06-11 – 2018-06-13 (×2): 17 g via ORAL
  Filled 2018-06-11 (×2): qty 1

## 2018-06-11 MED ORDER — DILTIAZEM HCL ER COATED BEADS 180 MG PO CP24
180.0000 mg | ORAL_CAPSULE | Freq: Every day | ORAL | Status: DC
  Administered 2018-06-12 – 2018-06-14 (×3): 180 mg via ORAL
  Filled 2018-06-11 (×3): qty 1

## 2018-06-11 MED ORDER — ONDANSETRON HCL 4 MG/2ML IJ SOLN
4.0000 mg | Freq: Four times a day (QID) | INTRAMUSCULAR | Status: DC | PRN
  Administered 2018-06-12: 4 mg via INTRAVENOUS
  Filled 2018-06-11: qty 2

## 2018-06-11 MED ORDER — FUROSEMIDE 10 MG/ML IJ SOLN
20.0000 mg | Freq: Once | INTRAMUSCULAR | Status: AC
  Administered 2018-06-11: 20 mg via INTRAVENOUS
  Filled 2018-06-11: qty 4

## 2018-06-11 MED ORDER — ACETAMINOPHEN 650 MG RE SUPP: 650.0000 mg | Freq: Four times a day (QID) | RECTAL | Status: DC | PRN

## 2018-06-11 MED ORDER — ATORVASTATIN CALCIUM 20 MG PO TABS
80.0000 mg | ORAL_TABLET | Freq: Every day | ORAL | Status: DC
  Administered 2018-06-11 – 2018-06-13 (×3): 80 mg via ORAL
  Filled 2018-06-11 (×3): qty 4

## 2018-06-11 MED ORDER — CLOPIDOGREL BISULFATE 75 MG PO TABS
75.0000 mg | ORAL_TABLET | Freq: Every day | ORAL | Status: DC
  Administered 2018-06-11 – 2018-06-14 (×4): 75 mg via ORAL
  Filled 2018-06-11 (×4): qty 1

## 2018-06-11 MED ORDER — ONDANSETRON HCL 4 MG PO TABS: 4.0000 mg | ORAL_TABLET | Freq: Four times a day (QID) | ORAL | Status: DC | PRN

## 2018-06-11 MED ORDER — NITROGLYCERIN 0.4 MG SL SUBL: 0.4000 mg | SUBLINGUAL_TABLET | SUBLINGUAL | Status: DC | PRN

## 2018-06-11 MED ORDER — ASPIRIN EC 81 MG PO TBEC
81.0000 mg | DELAYED_RELEASE_TABLET | Freq: Every day | ORAL | Status: DC
  Administered 2018-06-12 – 2018-06-14 (×3): 81 mg via ORAL
  Filled 2018-06-11 (×3): qty 1

## 2018-06-11 MED ORDER — BUDESONIDE 0.5 MG/2ML IN SUSP
0.5000 mg | Freq: Two times a day (BID) | RESPIRATORY_TRACT | Status: DC
  Administered 2018-06-11 – 2018-06-14 (×5): 0.5 mg via RESPIRATORY_TRACT
  Filled 2018-06-11 (×9): qty 2

## 2018-06-11 NOTE — ED Notes (Signed)
Admitting MD at bedside.

## 2018-06-11 NOTE — ED Notes (Signed)
MD notified of being unable to interrogate pacemaker. Per MD okay. Pt with noted dry, hacky cough at this time.

## 2018-06-11 NOTE — H&P (Addendum)
[facility] -  at [facility]   PATIENT NAME: Gary York    MR#:  [id number]  DATE OF BIRTH:  [birth year]  DATE OF ADMISSION:  06/11/2018  PRIMARY CARE PHYSICIAN: Marisue Ivan, MD   REQUESTING/REFERRING PHYSICIAN: Dr Dorothea Glassman  CHIEF COMPLAINT:   Chief Complaint  Patient presents with  . Chest Pain    HISTORY OF PRESENT ILLNESS:  Gary York  is a 85 y.o. male presents the hospital with not feeling good.  Patient had to be reminded on why he came to the hospital.  He complains of shortness of breath.  He states he cannot take a deep breath.  Complains of some tightness in the chest but cannot explain when it started or how severe it was.  He states he is currently not having chest pain.  Some cough.  Some wheeze.  He lives at the [facility] [place] and has a 24-hour live-in and they are healthy.  No travel history.  PAST MEDICAL HISTORY:   Past Medical History:  Diagnosis Date  . CAD (coronary artery disease)   . Complete heart block (HCC)   . Hyperlipidemia   . Hypertension     PAST SURGICAL HISTORY:   Past Surgical History:  Procedure Laterality Date  . PACEMAKER IMPLANT      SOCIAL HISTORY:   Social History   Tobacco Use  . Smoking status: Former Smoker    Packs/day: 1.00    Years: 20.00    Pack years: 20.00    Types: Cigarettes    Last attempt to quit: 1990    Years since quitting: 30.2  . Smokeless tobacco: Never Used  Substance Use Topics  . Alcohol use: Not Currently    FAMILY HISTORY:   Family History  Problem Relation Age of Onset  . Dementia Mother   . Diabetes Mother   . Dementia Father     DRUG ALLERGIES:  No Known Allergies  REVIEW OF SYSTEMS:  CONSTITUTIONAL: No fever, fatigue or weakness.  EYES: No blurred or double vision.  EARS, NOSE, AND THROAT: No tinnitus or ear pain. No sore throat RESPIRATORY: Positive for cough, shortness of breath, and wheezing.  No hemoptysis.  CARDIOVASCULAR:  Positive chest pain.no orthopnea, edema.  GASTROINTESTINAL: No nausea, vomiting, diarrhea or abdominal pain. No blood in bowel movements GENITOURINARY: No dysuria, hematuria.  ENDOCRINE: No polyuria, nocturia,  HEMATOLOGY: No anemia, easy bruising or bleeding SKIN: No rash or lesion. MUSCULOSKELETAL: Positive for pain all over.  Some back pain NEUROLOGIC: No tingling, numbness, weakness.  PSYCHIATRY: Some anxiety and depression.   MEDICATIONS AT HOME:   Prior to Admission medications   Medication Sig Start Date End Date Taking? Authorizing Provider  acetaminophen (TYLENOL) 500 MG tablet Take 500 mg by mouth every 6 (six) hours as needed for fever.   Yes [provider]  albuterol (PROVENTIL HFA;VENTOLIN HFA) 108 (90 Base) MCG/ACT inhaler Inhale 2 puffs into the lungs every 6 (six) hours as needed for wheezing or shortness of breath. 09/12/17  Yes Enid Baas, MD  albuterol (PROVENTIL) (2.5 MG/3ML) 0.083% nebulizer solution Take 3 mLs (2.5 mg total) by nebulization every 6 (six) hours as needed for wheezing or shortness of breath. Dx: copd exacerbation J44.1 09/18/17  Yes Alford Highland, MD  aspirin EC 81 MG EC tablet Take 1 tablet (81 mg total) by mouth daily. 09/13/17  Yes Enid Baas, MD  atorvastatin (LIPITOR) 80 MG tablet Take 1 tablet (80 mg total) by mouth daily at 6  PM. 09/12/17  Yes Enid Baas, MD  clopidogrel (PLAVIX) 75 MG tablet Take 1 tablet (75 mg total) by mouth daily. 09/13/17  Yes Enid Baas, MD  diltiazem (DILTIAZEM CD) 180 MG 24 hr capsule Take 180 mg by mouth daily.   Yes [provider]  doxazosin (CARDURA) 2 MG tablet Take 2 mg by mouth daily. 08/19/17  Yes [provider]  Fluticasone-Salmeterol (ADVAIR DISKUS) 250-50 MCG/DOSE AEPB Inhale 1 puff into the lungs 2 (two) times daily. 09/12/17 09/12/18 Yes Enid Baas, MD  isosorbide mononitrate (IMDUR) 60 MG 24 hr tablet Take 1 tablet (60 mg total) by mouth daily.  09/19/17  Yes Airlie Blumenberg, MD  levalbuterol (XOPENEX) 1.25 MG/0.5ML nebulizer solution Take 1.25 mg by nebulization every 6 (six) hours as needed for wheezing or shortness of breath. Copd exacerbation V44.1 09/18/17 09/18/18 Yes Aariona Momon, MD  losartan (COZAAR) 100 MG tablet Take 100 mg by mouth daily. 11/20/16  Yes [provider]  magnesium hydroxide (MILK OF MAGNESIA) 400 MG/5ML suspension Take 30 mLs by mouth at bedtime as needed for mild constipation.   Yes [provider]  metoprolol tartrate (LOPRESSOR) 25 MG tablet Take 1 tablet (25 mg total) by mouth 2 (two) times daily. 09/12/17  Yes Enid Baas, MD  nitroGLYCERIN (NITROSTAT) 0.4 MG SL tablet Place 1 tablet (0.4 mg total) under the tongue every 5 (five) minutes as needed for chest pain. 09/12/17 06/11/18 Yes Enid Baas, MD  pantoprazole (PROTONIX) 40 MG tablet Take 1 tablet (40 mg total) by mouth daily. 09/12/17  Yes Enid Baas, MD  polyethylene glycol (MIRALAX / GLYCOLAX) packet Take 17 g by mouth daily as needed. 09/18/17  Yes Robi Mitter, MD      VITAL SIGNS:  Blood pressure 131/76, pulse 70, temperature 97.6 F (36.4 C), temperature source Oral, resp. rate 13, height 5\' 9"  (1.753 m), weight 95.3 kg, SpO2 97 %.  PHYSICAL EXAMINATION:  GENERAL:  85 y.o.-year-old patient lying in the bed with no acute distress.  EYES: Pupils equal, round, reactive to light and accommodation. No scleral icterus. Extraocular muscles intact.  HEENT: Head atraumatic, normocephalic. Oropharynx and nasopharynx clear.  NECK:  Supple, no jugular venous distention. No thyroid enlargement, no tenderness.  LUNGS: Decreased breath sounds bilaterally, slight expiratory wheezing.positive rales at the bases. No use of accessory muscles of respiration.  CARDIOVASCULAR: S1, S2 normal. No murmurs, rubs, or gallops.  ABDOMEN: Soft, nontender, nondistended. Bowel sounds present. No organomegaly or mass.  EXTREMITIES:  Trace pedal edema.no cyanosis, or clubbing.  NEUROLOGIC: Cranial nerves II through XII are intact. Muscle strength 5/5 in all extremities. Sensation intact. Gait not checked.  PSYCHIATRIC: The patient is alert and answers questions.  SKIN: No rash, lesion, or ulcer.   LABORATORY PANEL:   CBC Recent Labs  Lab 06/11/18 0850  WBC 7.9  HGB 11.0*  HCT 32.2*  PLT 165   ------------------------------------------------------------------------------------------------------------------  Chemistries  Recent Labs  Lab 06/11/18 0850  NA 129*  K 3.8  CL 97*  CO2 25  GLUCOSE 136*  BUN 12  CREATININE 0.88  CALCIUM 8.4*  AST 22  ALT 18  ALKPHOS 44  BILITOT 0.9   ------------------------------------------------------------------------------------------------------------------  Cardiac Enzymes Recent Labs  Lab 06/11/18 0850  TROPONINI <0.03   ------------------------------------------------------------------------------------------------------------------  RADIOLOGY:  Dg Chest Portable 1 View  Result Date: 06/11/2018 CLINICAL DATA:  Chest pain and possible myocardial infarction. EXAM: PORTABLE CHEST 1 VIEW COMPARISON:  09/16/2017 FINDINGS: Stable heart size and appearance of biventricular pacer. Stable elevation of  the right hemidiaphragm with volume loss of the right lung. Stable chronic lung disease. There may be mild pulmonary interstitial edema. No overt airspace edema or pleural effusions. No pneumothorax. IMPRESSION: Possible mild pulmonary interstitial edema. Stable chronic lung disease, elevated right hemidiaphragm and right lung volume loss. Electronically Signed   By: Irish Lack M.D.   On: 06/11/2018 09:08    EKG:   Atrial paced rhythm  IMPRESSION AND PLAN:   1.  Acute hypoxic respiratory failure.  Oxygen supplementation.  Send off flu and viral respiratory panel.  Unlikely candidate for Covid-19. 2.  Acute diastolic congestive heart failure.  Diuresis with IV  Lasix low-dose.  Echocardiogram.  Patient already on metoprolol and ARB.  Cardiology consultation. 3.  Chest pain with history of chest pain.  Serial troponins.  Monitor on telemetry.  Continue aspirin Plavix and beta-blocker. 4.  COPD exacerbation continue nebulizers DuoNeb and budesonide.  Hold off on steroids until flu and respiratory panel back. 5.  Hypertension continue metoprolol, ARB and Cardizem 6.  Hyperlipidemia.  On atorvastatin 7.  Weakness.  Physical therapy evaluation for tomorrow 8.  Hyponatremia likely from CHF.  Continue to monitor with diuresis.  All the records are reviewed and case discussed with ED provider. Management plans discussed with the patient, family and they are in agreement.  CODE STATUS: DNR  TOTAL TIME TAKING CARE OF THIS PATIENT: 50 minutes, including ACP time.    Alford Highland M.D on 06/11/2018 at 11:43 AM  Between 7am to 6pm - Pager - [phone]  After 6pm call admission pager [phone]  [facility] Office  [phone]  CC: Primary care physician; Marisue Ivan, MD

## 2018-06-11 NOTE — ED Notes (Signed)
Dr. Callwood at bedside at this time.  

## 2018-06-11 NOTE — ED Provider Notes (Signed)
[facility] Emergency Department Provider Note   ____________________________________________   First MD Initiated Contact with Patient 06/11/18 [phone]     (approximate)  I have reviewed the triage vital signs and the nursing notes.   HISTORY  Chief Complaint Chest Pain   HPI Gary York is a 85 y.o. male who comes in by EMS complaining of chest fullness.  Patient says is been going on for about an hour.  He says he has chest congestion.  He had no change in his chest fullness with 2 sublingual nitros and Nitropaste.  EMS also gave him aspirin.  Patient has had a pacemaker in for 30 years.  He says he has COPD as well.  Review of the old records show coronary artery disease complete heart block hyperlipidemia hypertension congestive heart failure.  Patient's caregiver says he has a pacer defibrillator.  Patient told the nurse he felt like he was going to die.  He told me there was nothing much going on he does not appear to be confused generally.        Past Medical History:  Diagnosis Date  . CAD (coronary artery disease)   . Complete heart block (HCC)   . Hyperlipidemia   . Hypertension     Patient Active Problem List   Diagnosis Date Noted  . CHF (congestive heart failure) (HCC) 09/16/2017  . Chest pain 09/09/2017    Past Surgical History:  Procedure Laterality Date  . PACEMAKER IMPLANT      Prior to Admission medications   Medication Sig Start Date End Date Taking? Authorizing Provider  acetaminophen (TYLENOL) 500 MG tablet Take 500 mg by mouth every 6 (six) hours as needed for fever.   Yes [provider]  albuterol (PROVENTIL HFA;VENTOLIN HFA) 108 (90 Base) MCG/ACT inhaler Inhale 2 puffs into the lungs every 6 (six) hours as needed for wheezing or shortness of breath. 09/12/17  Yes Enid Baas, MD  albuterol (PROVENTIL) (2.5 MG/3ML) 0.083% nebulizer solution Take 3 mLs (2.5 mg total) by nebulization every 6 (six) hours as  needed for wheezing or shortness of breath. Dx: copd exacerbation J44.1 09/18/17  Yes Alford Highland, MD  aspirin EC 81 MG EC tablet Take 1 tablet (81 mg total) by mouth daily. 09/13/17  Yes Enid Baas, MD  atorvastatin (LIPITOR) 80 MG tablet Take 1 tablet (80 mg total) by mouth daily at 6 PM. 09/12/17  Yes Enid Baas, MD  clopidogrel (PLAVIX) 75 MG tablet Take 1 tablet (75 mg total) by mouth daily. 09/13/17  Yes Enid Baas, MD  diltiazem (DILTIAZEM CD) 180 MG 24 hr capsule Take 180 mg by mouth daily.   Yes [provider]  doxazosin (CARDURA) 2 MG tablet Take 2 mg by mouth daily. 08/19/17  Yes [provider]  Fluticasone-Salmeterol (ADVAIR DISKUS) 250-50 MCG/DOSE AEPB Inhale 1 puff into the lungs 2 (two) times daily. 09/12/17 09/12/18 Yes Enid Baas, MD  isosorbide mononitrate (IMDUR) 60 MG 24 hr tablet Take 1 tablet (60 mg total) by mouth daily. 09/19/17  Yes Wieting, Richard, MD  levalbuterol (XOPENEX) 1.25 MG/0.5ML nebulizer solution Take 1.25 mg by nebulization every 6 (six) hours as needed for wheezing or shortness of breath. Copd exacerbation V44.1 09/18/17 09/18/18 Yes Wieting, Richard, MD  losartan (COZAAR) 100 MG tablet Take 100 mg by mouth daily. 11/20/16  Yes [provider]  magnesium hydroxide (MILK OF MAGNESIA) 400 MG/5ML suspension Take 30 mLs by mouth at bedtime as needed for mild constipation.  Yes [provider]  metoprolol tartrate (LOPRESSOR) 25 MG tablet Take 1 tablet (25 mg total) by mouth 2 (two) times daily. 09/12/17  Yes Enid BaasKalisetti, Radhika, MD  nitroGLYCERIN (NITROSTAT) 0.4 MG SL tablet Place 1 tablet (0.4 mg total) under the tongue every 5 (five) minutes as needed for chest pain. 09/12/17 06/11/18 Yes Enid BaasKalisetti, Radhika, MD  pantoprazole (PROTONIX) 40 MG tablet Take 1 tablet (40 mg total) by mouth daily. 09/12/17  Yes Enid BaasKalisetti, Radhika, MD  polyethylene glycol (MIRALAX / GLYCOLAX) packet Take 17 g by mouth daily as  needed. 09/18/17  Yes Alford HighlandWieting, Richard, MD    Allergies Patient has no known allergies.  Family History  Problem Relation Age of Onset  . Dementia Mother   . Diabetes Mother   . Dementia Father     Social History Social History   Tobacco Use  . Smoking status: Former Smoker    Packs/day: 1.00    Years: 20.00    Pack years: 20.00    Types: Cigarettes    Last attempt to quit: 1990    Years since quitting: 30.2  . Smokeless tobacco: Never Used  Substance Use Topics  . Alcohol use: Not Currently  . Drug use: Never    Review of Systems  Constitutional: No fever/chills Eyes: No visual changes. ENT: No sore throat. Cardiovascular: See HPI Respiratory: Denies shortness of breath. Gastrointestinal: No abdominal pain.  No nausea, no vomiting.  No diarrhea.  No constipation. Genitourinary: Negative for dysuria. Musculoskeletal: Negative for back pain. Skin: Negative for rash. Neurological: Negative for headaches, focal weakness  ____________________________________________   PHYSICAL EXAM:  VITAL SIGNS: ED Triage Vitals  Enc Vitals Group     BP 06/11/18 0849 (!) 146/88     Pulse Rate 06/11/18 0849 70     Resp 06/11/18 0849 (!) 21     Temp 06/11/18 0849 97.6 F (36.4 C)     Temp Source 06/11/18 0849 Oral     SpO2 06/11/18 0849 (!) 88 %     Weight 06/11/18 0846 210 lb (95.3 kg)     Height 06/11/18 0846 5\' 9"  (1.753 m)     Head Circumference --      Peak Flow --      Pain Score 06/11/18 0846 7     Pain Loc --      Pain Edu? --      Excl. in GC? --    Constitutional: Alert and oriented. Well appearing and in no acute distress. Eyes: Conjunctivae are normal.  Head: Atraumatic. Nose: No congestion/rhinnorhea. Mouth/Throat: Mucous membranes are moist.  Oropharynx non-erythematous. Neck: No stridor. Cardiovascular: Normal rate, regular rhythm. Grossly normal heart sounds.  Good peripheral circulation. Respiratory: Normal respiratory effort.  No retractions. Lungs  diffuse wheezes also tight Gastrointestinal: Soft and nontender. No distention. No abdominal bruits. No CVA tenderness. Musculoskeletal: No lower extremity tenderness slight bilateral edema.  Neurologic:  Normal speech and language. No gross focal neurologic deficits are appreciated. Skin:  Skin is warm, dry and intact. No rash noted. Psychiatric: Mood and affect are normal. Speech and behavior are normal.  ____________________________________________   LABS (all labs ordered are listed, but only abnormal results are displayed)  Labs Reviewed  COMPREHENSIVE METABOLIC PANEL - Abnormal; Notable for the following components:      Result Value   Sodium 129 (*)    Chloride 97 (*)    Glucose, Bld 136 (*)    Calcium 8.4 (*)    All other components within normal  limits  CBC WITH DIFFERENTIAL/PLATELET - Abnormal; Notable for the following components:   RBC 3.29 (*)    Hemoglobin 11.0 (*)    HCT 32.2 (*)    All other components within normal limits  BRAIN NATRIURETIC PEPTIDE - Abnormal; Notable for the following components:   B Natriuretic Peptide 295.0 (*)    All other components within normal limits  TROPONIN I  TROPONIN I   ____________________________________________  EKG  EKG read interpreted by me shows fully paced rhythm with AV pacing.  EKG looks similar to one from June of last year and to the EKG that EMS did ____________________________________________  RADIOLOGY  ED MD interpretation:   Official radiology report(s): Dg Chest Portable 1 View  Result Date: 06/11/2018 CLINICAL DATA:  Chest pain and possible myocardial infarction. EXAM: PORTABLE CHEST 1 VIEW COMPARISON:  09/16/2017 FINDINGS: Stable heart size and appearance of biventricular pacer. Stable elevation of the right hemidiaphragm with volume loss of the right lung. Stable chronic lung disease. There may be mild pulmonary interstitial edema. No overt airspace edema or pleural effusions. No pneumothorax.  IMPRESSION: Possible mild pulmonary interstitial edema. Stable chronic lung disease, elevated right hemidiaphragm and right lung volume loss. Electronically Signed   By: Irish Lack M.D.   On: 06/11/2018 09:08    ____________________________________________   PROCEDURES  Procedure(s) performed (including Critical Care):  Procedures   ____________________________________________   INITIAL IMPRESSION / ASSESSMENT AND PLAN / ED COURSE  Patient really has not had much output with the Lasix.  When I take him off of his oxygen his sats drift down.  They get to 89 or slightly lower.  I am somewhat worried about what he told the nurse that his chest fullness made him feel like he was going to die.  He has had a recent N STEMI last June and he has a known history of CHF.  I will asked the hospitalist to observe him overnight.             ____________________________________________   FINAL CLINICAL IMPRESSION(S) / ED DIAGNOSES  Final diagnoses:  Chest pain, unspecified type  Congestive heart failure, unspecified HF chronicity, unspecified heart failure type (HCC)  Hypoxia     ED Discharge Orders    None       Note:  This document was prepared using Dragon voice recognition software and may include unintentional dictation errors.    Arnaldo Natal, MD 06/11/18 1114

## 2018-06-11 NOTE — ED Notes (Signed)
This RN and Mallie Snooks, RN attempted to interrogate pacemaker x 2.

## 2018-06-11 NOTE — Progress Notes (Signed)
Patient ID: Gary York, male   DOB: Aug 15, 1921, 85 y.o.   MRN: [id number]  ACP time  Patient and daughter present  Diagnosis: Acute hypoxic respiratory failure, acute diastolic CHF, chest pain, COPD exacerbation, hypertension hyperlipidemia  CODE STATUS discussed and patient is a DNR.  Patient presenting with chest pain and shortness of breath.  Patient has a history of COPD and CAD.  Chest x-ray concerning for heart failure.  Will send off influenza and respiratory panel.  Continue to monitor closely.  Time spent on ACP discussion 17 minutes Dr. Alford Highland

## 2018-06-11 NOTE — ED Notes (Signed)
ED TO INPATIENT HANDOFF REPORT  ED Nurse Name and Phone #:  Aundra Millet [phone]  S Name/Age/Gender Gary York 85 y.o. male Room/Bed: ED05A/ED05A  Code Status   Code Status: Prior  Home/SNF/Other Nursing Home Patient oriented to: self, place, time and situation Is this baseline? Yes   Triage Complete: Triage complete  Chief Complaint code stemi  Triage Note Pt presents to ED via [facility] as Code Stemi, per EMS initial call for SOB, reported heaviness in chest, unable to state when heaviness in chest started, per EMS pt told caregivers at [facility] that he felt like he was going to die. Pt presents in paced rhythm. Dr. Darnelle Catalan at bedside at this time, canceled code Stemi. Pt with audible wheezes upon arrival to ED. 2SL nitro, 1in Nitro paste and 324 ASA given PTA by EMS.    Allergies No Known Allergies  Level of Care/Admitting Diagnosis ED Disposition    ED Disposition Condition Comment   Admit  [facility]ospital Area: [facility] [[id number]]  Level of Care: Telemetry [5]  Diagnosis: CHF (congestive heart failure) [facility]) [[id number]]  Admitting Physician: Alford Highland [[id number]]  Attending Physician: Alford Highland [phone]  Estimated length of stay: past midnight tomorrow  Certification:: I certify this patient will need inpatient services for at least 2 midnights  PT Class (Do Not Modify): Inpatient [101]  PT Acc Code (Do Not Modify): Private [1]       B Medical/Surgery History Past Medical History:  Diagnosis Date  . CAD (coronary artery disease)   . Complete heart block (HCC)   . Hyperlipidemia   . Hypertension    Past Surgical History:  Procedure Laterality Date  . PACEMAKER IMPLANT       A IV Location/Drains/Wounds Patient Lines/Drains/Airways Status   Active Line/Drains/Airways    Name:   Placement date:   Placement time:   Site:   Days:   Peripheral IV 06/11/18 Left Antecubital   06/11/18    0842    Antecubital   less than 1           Intake/Output Last 24 hours  Intake/Output Summary (Last 24 hours) at 06/11/2018 1120 Last data filed at 06/11/2018 1009 Gross per 24 hour  Intake -  Output 340 ml  Net -340 ml    Labs/Imaging Results for orders placed or performed during the hospital encounter of 06/11/18 (from the past 48 hour(s))  Comprehensive metabolic panel     Status: Abnormal   Collection Time: 06/11/18  8:50 AM  Result Value Ref Range   Sodium 129 (L) 135 - 145 mmol/L   Potassium 3.8 3.5 - 5.1 mmol/L   Chloride 97 (L) 98 - 111 mmol/L   CO2 25 22 - 32 mmol/L   Glucose, Bld 136 (H) 70 - 99 mg/dL   BUN 12 8 - 23 mg/dL   Creatinine, Ser 1.69 0.61 - 1.24 mg/dL   Calcium 8.4 (L) 8.9 - 10.3 mg/dL   Total Protein 6.5 6.5 - 8.1 g/dL   Albumin 3.9 3.5 - 5.0 g/dL   AST 22 15 - 41 U/L   ALT 18 0 - 44 U/L   Alkaline Phosphatase 44 38 - 126 U/L   Total Bilirubin 0.9 0.3 - 1.2 mg/dL   GFR calc non Af Amer >60 >60 mL/min   GFR calc Af Amer >60 >60 mL/min   Anion gap 7 5 - 15    Comment: Performed at [facility], [address] [place],  [place] [address]  CBC with Differential     Status: Abnormal   Collection Time: 06/11/18  8:50 AM  Result Value Ref Range   WBC 7.9 4.0 - 10.5 K/uL   RBC 3.29 (L) 4.22 - 5.81 MIL/uL   Hemoglobin 11.0 (L) 13.0 - 17.0 g/dL   HCT 60.4 (L) 54.0 - 98.1 %   MCV 97.9 80.0 - 100.0 fL   MCH 33.4 26.0 - 34.0 pg   MCHC 34.2 30.0 - 36.0 g/dL   RDW 19.1 47.8 - 29.5 %   Platelets 165 150 - 400 K/uL   nRBC 0.0 0.0 - 0.2 %   Neutrophils Relative % 76 %   Neutro Abs 6.0 1.7 - 7.7 K/uL   Lymphocytes Relative 14 %   Lymphs Abs 1.1 0.7 - 4.0 K/uL   Monocytes Relative 6 %   Monocytes Absolute 0.5 0.1 - 1.0 K/uL   Eosinophils Relative 2 %   Eosinophils Absolute 0.2 0.0 - 0.5 K/uL   Basophils Relative 1 %   Basophils Absolute 0.1 0.0 - 0.1 K/uL   Immature Granulocytes 1 %   Abs Immature Granulocytes 0.05 0.00 - 0.07 K/uL    Comment: Performed at [facility], [address] [place], [place] [address]  Troponin I - Once     Status: None   Collection Time: 06/11/18  8:50 AM  Result Value Ref Range   Troponin I <0.03 <0.03 ng/mL    Comment: Performed at [facility], [address] [place], [place] [address]  Brain natriuretic peptide     Status: Abnormal   Collection Time: 06/11/18  8:50 AM  Result Value Ref Range   B Natriuretic Peptide 295.0 (H) 0.0 - 100.0 pg/mL    Comment: Performed at [facility], [address], [place], [place] [address]   Dg Chest Portable 1 View  Result Date: 06/11/2018 CLINICAL DATA:  Chest pain and possible myocardial infarction. EXAM: PORTABLE CHEST 1 VIEW COMPARISON:  09/16/2017 FINDINGS: Stable heart size and appearance of biventricular pacer. Stable elevation of the right hemidiaphragm with volume loss of the right lung. Stable chronic lung disease. There may be mild pulmonary interstitial edema. No overt airspace edema or pleural effusions. No pneumothorax. IMPRESSION: Possible mild pulmonary interstitial edema. Stable chronic lung disease, elevated right hemidiaphragm and right lung volume loss. Electronically Signed   By: Irish Lack M.D.   On: 06/11/2018 09:08    Pending Labs Unresulted Labs (From admission, onward)    Start     Ordered   06/11/18 1034  Troponin I - ONCE - STAT  ONCE - STAT,   STAT     06/11/18 1034          Vitals/Pain Today's Vitals   06/11/18 0849 06/11/18 0900 06/11/18 0930 06/11/18 1030  BP: (!) 146/88 (!) 155/104 (!) 152/79 131/76  Pulse: 70 75 70 70  Resp: (!) Temp: 97.6 F (36.4 C)     TempSrc: Oral     SpO2: (!) 88% 99% 97% 97%  Weight:      Height:      PainSc:        Isolation Precautions No active isolations  Medications Medications  ipratropium-albuterol (DUONEB) 0.5-2.5 (3) MG/3ML nebulizer solution 3 mL (3 mLs Nebulization Given 06/11/18 0859)  furosemide (LASIX) injection 20 mg (20 mg Intravenous Given 06/11/18  0936)    Mobility walks with device High fall risk   Focused Assessments Cardiac Assessment Handoff:  Cardiac Rhythm: Ventricular paced Lab Results  Component Value Date   TROPONINI <0.03 06/11/2018   No results found for: DDIMER Does the Patient currently have chest pain? Yes     R Recommendations: See Admitting Provider Note  Report given to:   Additional Notes:  V-paced rhythm, 2SL nitro, 1in Nitro paste given, 324 ASA given PTA by EMS, [employer] Pacemaker, intermittently forgetful but compliant with nursing instructions.

## 2018-06-11 NOTE — ED Triage Notes (Addendum)
Pt presents to ED via [facility] as Code Stemi, per EMS initial call for SOB, reported heaviness in chest, unable to state when heaviness in chest started, per EMS pt told caregivers at [facility] that he felt like he was going to die. Pt presents in paced rhythm. Dr. Darnelle Catalan at bedside at this time, canceled code Stemi. Pt with audible wheezes upon arrival to ED. 2SL nitro, 1in Nitro paste and 324 ASA given PTA by EMS.

## 2018-06-11 NOTE — ED Notes (Signed)
Called to verify if RN had no questions regarding patient. Unable to reach RN.

## 2018-06-11 NOTE — Progress Notes (Signed)
*  PRELIMINARY RESULTS* Echocardiogram 2D Echocardiogram has been performed.  Cristela Blue 06/11/2018, 2:26 PM

## 2018-06-11 NOTE — Progress Notes (Signed)
Ch responded to a STEMI in the ED for the pt. Pt had yet to arrive in the ER and was later informed that the STEMI had been cancelled. Ch stopped by the pt room and noticed staff was still getting pt stable. Pt had a family member present w/ him. F/u recommended.    06/11/18 0900  Clinical Encounter Type  Visited With Patient and family together  Visit Type Code  Referral From Physician  Consult/Referral To Chaplain  Spiritual Encounters  Spiritual Needs Emotional  Stress Factors  Patient Stress Factors Health changes  Family Stress Factors None identified

## 2018-06-12 ENCOUNTER — Encounter: Payer: Self-pay | Admitting: Pulmonary Disease

## 2018-06-12 LAB — LIPID PANEL
Cholesterol: 67 mg/dL (ref 0–200)
HDL: 36 mg/dL — ABNORMAL LOW (ref >40)
LDL CALC: 25 mg/dL (ref 0–99)
Total CHOL/HDL Ratio: 1.9 RATIO
Triglycerides: 29 mg/dL (ref <150)
VLDL: 6 mg/dL (ref 0–40)

## 2018-06-12 LAB — BASIC METABOLIC PANEL
Anion gap: 8 (ref 5–15)
BUN: 13 mg/dL (ref 8–23)
CO2: 26 mmol/L (ref 22–32)
Calcium: 8.3 mg/dL — ABNORMAL LOW (ref 8.9–10.3)
Chloride: 97 mmol/L — ABNORMAL LOW (ref 98–111)
Creatinine, Ser: 0.77 mg/dL (ref 0.61–1.24)
GFR calc Af Amer: >60 mL/min (ref >60)
GFR calc non Af Amer: >60 mL/min (ref >60)
Glucose, Bld: 97 mg/dL (ref 70–99)
Potassium: 3.8 mmol/L (ref 3.5–5.1)
Sodium: 131 mmol/L — ABNORMAL LOW (ref 135–145)

## 2018-06-12 LAB — CBC
HEMATOCRIT: 30.2 % — AB (ref 39.0–52.0)
Hemoglobin: 10.2 g/dL — ABNORMAL LOW (ref 13.0–17.0)
MCH: 33 pg (ref 26.0–34.0)
MCHC: 33.8 g/dL (ref 30.0–36.0)
MCV: 97.7 fL (ref 80.0–100.0)
Platelets: 147 10*3/uL — ABNORMAL LOW (ref 150–400)
RBC: 3.09 MIL/uL — ABNORMAL LOW (ref 4.22–5.81)
RDW: 13.2 % (ref 11.5–15.5)
WBC: 7.9 10*3/uL (ref 4.0–10.5)
nRBC: 0 % (ref 0.0–0.2)

## 2018-06-12 MED ORDER — GUAIFENESIN-DM 100-10 MG/5ML PO SYRP
5.0000 mL | ORAL_SOLUTION | ORAL | Status: DC | PRN
  Administered 2018-06-12 – 2018-06-13 (×3): 5 mL via ORAL
  Filled 2018-06-12 (×4): qty 5

## 2018-06-12 MED ORDER — ACETYLCYSTEINE 20 % IN SOLN
2.0000 mL | Freq: Two times a day (BID) | RESPIRATORY_TRACT | Status: DC
  Administered 2018-06-12: 2 mL via RESPIRATORY_TRACT
  Filled 2018-06-12: qty 4

## 2018-06-12 MED ORDER — DIPHENHYDRAMINE HCL 25 MG PO CAPS
25.0000 mg | ORAL_CAPSULE | Freq: Every evening | ORAL | Status: DC | PRN
  Administered 2018-06-12: 25 mg via ORAL
  Filled 2018-06-12: qty 1

## 2018-06-12 MED ORDER — ACETYLCYSTEINE 20 % IN SOLN
2.0000 mL | Freq: Two times a day (BID) | RESPIRATORY_TRACT | Status: DC
  Administered 2018-06-13: 2 mL via RESPIRATORY_TRACT
  Filled 2018-06-12 (×4): qty 4

## 2018-06-12 MED ORDER — PREDNISONE 50 MG PO TABS
50.0000 mg | ORAL_TABLET | Freq: Every day | ORAL | Status: DC
  Administered 2018-06-13 – 2018-06-14 (×2): 50 mg via ORAL
  Filled 2018-06-12 (×2): qty 1

## 2018-06-12 NOTE — Plan of Care (Signed)
  Problem: Clinical Measurements: Goal: Respiratory complications will improve Outcome: Progressing   Problem: Safety: Goal: Ability to remain free from injury will improve Outcome: Progressing   

## 2018-06-12 NOTE — Progress Notes (Signed)
[employer] [facility] [place] [facility]   PATIENT NAME: Gary York    MR#:  [id number]  DATE OF BIRTH:  01/29/1922  SUBJECTIVE:  CHIEF COMPLAINT:   Chief Complaint  Patient presents with  . Chest Pain   With daughter at the bedside eating a meal tray sitting up at the edge of the bed, not complaining of any symptoms when asked. Per daughter he is a bit more wheezy and short of breath than he is at baseline. Has not been complaining of any chest pain today.   REVIEW OF SYSTEMS:  CONSTITUTIONAL: No fever, fatigue, or weakness.  EYES: No blurred or double vision.  EARS, NOSE, AND THROAT: No tinnitus or ear pain. No sore throat RESPIRATORY: Positive for cough, shortness of breath, and wheezing.  No hemoptysis.  CARDIOVASCULAR:  Negative for chest pain or discomfort today. no orthopnea, edema.  GASTROINTESTINAL: No nausea, vomiting, diarrhea or abdominal pain. No blood in bowel movements GENITOURINARY: No dysuria, hematuria.  ENDOCRINE: No polyuria, nocturia,  HEMATOLOGY: No anemia, easy bruising or bleeding SKIN: No rash or lesion. MUSCULOSKELETAL:  Denies joint and back pain when asked NEUROLOGIC: No tingling, numbness, weakness.  PSYCHIATRY: Denies anxiety or depression today DRUG ALLERGIES:  No Known Allergies VITALS:  Blood pressure 99/64, pulse 70, temperature 98 F (36.7 C), temperature source Oral, resp. rate 18, height 5\' 9"  (1.753 m), weight 91.1 kg, SpO2 96 %. PHYSICAL EXAMINATION:  GENERAL:  85 y.o.-year-old patient sitting up at the edge of the bed with no acute distress.  EYES: Pupils equal, round, reactive to light and accommodation. No scleral icterus. Extraocular muscles intact.  HEENT: Head atraumatic, normocephalic. Oropharynx and nasopharynx clear.  NECK:  Supple, no jugular venous distention. No thyroid enlargement, no tenderness.  LUNGS: Decreased breath sounds bilaterally, slight expiratory wheezing with positive rales at the bases  bilaterally. No use of accessory muscles of respiration.  CARDIOVASCULAR: S1, S2 normal. No murmurs, rubs, or gallops.  ABDOMEN: Soft, nontender, nondistended. Bowel sounds present. No organomegaly or mass.  EXTREMITIES: Trace pedal edema. No cyanosis, or clubbing.  NEUROLOGIC: Cranial nerves II through XII are intact. Muscle strength 5/5 in all extremities. Sensation intact. Gait not checked.  PSYCHIATRIC: The patient is alert and oriented x3. However he requires some questions to be asked twice and looks to daughter to answer some questions about symptoms. SKIN: No rash, lesion, or ulcer.  LABORATORY PANEL:  Male CBC Recent Labs  Lab 06/12/18 0434  WBC 7.9  HGB 10.2*  HCT 30.2*  PLT 147*   ------------------------------------------------------------------------------------------------------------------ Chemistries  Recent Labs  Lab 06/11/18 0850 06/12/18 0434  NA 129* 131*  K 3.8 3.8  CL 97* 97*  CO2 25 26  GLUCOSE 136* 97  BUN 12 13  CREATININE 0.88 0.77  CALCIUM 8.4* 8.3*  AST 22  --   ALT 18  --   ALKPHOS 44  --   BILITOT 0.9  --    RADIOLOGY:  No results found. ASSESSMENT AND PLAN:   Gary York  is a 85 y.o. male with past medical history of CAD, CHF with LVEF 45%, COPD followed by Dr. Meredeth Ide, complete heart block status post pacemaker implant reportedly 30 years ago, hyperlipidemia and hypertension  who presents the hospital with malaise and shortness of breath associated with cough and wheezing. Complained of some tightness in the chest but cannot explain when it started or how severe it was. He lives at the [facility] [place] and has a 24-hour live-in  and they are healthy. No travel history or known sick contacts otherwise.  1.  Acute hypoxic respiratory failure.  Oxygen supplementation.  Send off flu and viral respiratory panel: Negative. Unlikely candidate for Covid-19.  2.  Acute diastolic congestive heart failure.  Diuresis with IV Lasix low-dose.   Echocardiogram.  Patient already on metoprolol and ARB.  Cardiology consultation. Seen by Dr. Juliann Pares who recommends continue diuresis, echo already done shows LVEF 45%.  3.  Chest pain with history of chest pain.  Serial troponins, last 3 have been less than 0.03. Monitor on telemetry.  Continue aspirin Plavix and beta-blocker.  4.  COPD exacerbation continue nebulizers DuoNeb and budesonide.  Held off on steroids until flu and respiratory panel back which are negative. Consult to pulmonology Dr. Karna Christmas: Started Mucomyst twice daily to help with bringing up sputum.  Pulmicort.  Zithromax and DuoNeb therapy.   Recommended aspiration precautions.  Will order PT OT and speech.  Outpatient follow-up with [facility] pulmonology  5.  Hypertension continue metoprolol, ARB and Cardizem  6.  Hyperlipidemia.  On atorvastatin  7.  Weakness.  Physical therapy  8.  Hyponatremia likely from CHF. Continue to monitor with diuresis.   All the records are reviewed and case is discussed with Care Management/Social Worker. Management plans discussed with the patient and/or family and they are in agreement.  CODE STATUS: DNR  TOTAL TIME TAKING CARE OF THIS PATIENT: 30 minutes.   More than 50% of the time was spent in counseling/coordination of care: YES  POSSIBLE D/C IN 1-2 DAYS, DEPENDING ON CLINICAL CONDITION.   Stormy Fabian PA-C on 06/12/2018 at 5:06 PM  Between 7am to 6pm - Pager - [phone]  After 6 pm go to [email or web address] - Social research officer, government  [employer] [facility] [facility]  Office  [phone]  CC: Primary care physician; Marisue Ivan, MD  Note: This dictation was prepared with Dragon dictation along with smaller phrase technology. Any transcriptional errors that result from this process are unintentional.

## 2018-06-12 NOTE — Consult Note (Signed)
Reason for Consult: Vague chest pain shortness of breath diastolic heart failure Referring Physician: Dr.Linthavong, primary Dr. Carola Frostichard Whiting hospitalist Cardiologist Dr. Raymondo BandParaschos  Gary York is an 85 y.o. male.  HPI: 85 year old male came to the hospital not feeling good was not sure why he came to the hospital appears to have slight altered mental status changes complains of shortness of breath states he had trouble taking a deep breath has had some tightness in the chest unable to completely elaborate he states he is currently not having chest pain complains of some cough some mild wheezing [facility] [place] patient has known coronary disease mainly complains of nausea with no significant vomiting denies any diarrhea no fever chills or sweats denies any travel history has a permanent pacemaker in place without any recent symptoms  Past Medical History:  Diagnosis Date  . CAD (coronary artery disease)   . Complete heart block (HCC)   . Hyperlipidemia   . Hypertension     Past Surgical History:  Procedure Laterality Date  . PACEMAKER IMPLANT      Family History  Problem Relation Age of Onset  . Dementia Mother   . Diabetes Mother   . Dementia Father     Social History:  reports that he quit smoking about 30 years ago. His smoking use included cigarettes. He has a 20.00 pack-year smoking history. He has never used smokeless tobacco. He reports previous alcohol use. He reports that he does not use drugs.  Allergies: No Known Allergies  Medications: I have reviewed the patient's current medications.  Results for orders placed or performed during the hospital encounter of 06/11/18 (from the past 48 hour(s))  Comprehensive metabolic panel     Status: Abnormal   Collection Time: 06/11/18  8:50 AM  Result Value Ref Range   Sodium 129 (L) 135 - 145 mmol/L   Potassium 3.8 3.5 - 5.1 mmol/L   Chloride 97 (L) 98 - 111 mmol/L   CO2 25 22 - 32 mmol/L   Glucose, Bld  136 (H) 70 - 99 mg/dL   BUN 12 8 - 23 mg/dL   Creatinine, Ser 7.820.88 0.61 - 1.24 mg/dL   Calcium 8.4 (L) 8.9 - 10.3 mg/dL   Total Protein 6.5 6.5 - 8.1 g/dL   Albumin 3.9 3.5 - 5.0 g/dL   AST 22 15 - 41 U/L   ALT 18 0 - 44 U/L   Alkaline Phosphatase 44 38 - 126 U/L   Total Bilirubin 0.9 0.3 - 1.2 mg/dL   GFR calc non Af Amer >60 >60 mL/min   GFR calc Af Amer >60 >60 mL/min   Anion gap 7 5 - 15    Comment: Performed at [facility], [address] [place], [place] [id number]  CBC with Differential     Status: Abnormal   Collection Time: 06/11/18  8:50 AM  Result Value Ref Range   WBC 7.9 4.0 - 10.5 K/uL   RBC 3.29 (L) 4.22 - 5.81 MIL/uL   Hemoglobin 11.0 (L) 13.0 - 17.0 g/dL   HCT 30.832.2 (L) 65.739.0 - 84.652.0 %   MCV 97.9 80.0 - 100.0 fL   MCH 33.4 26.0 - 34.0 pg   MCHC 34.2 30.0 - 36.0 g/dL   RDW 96.213.2 95.211.5 - 84.115.5 %   Platelets 165 150 - 400 K/uL   nRBC 0.0 0.0 - 0.2 %   Neutrophils Relative % 76 %   Neutro Abs 6.0 1.7 - 7.7 K/uL  Lymphocytes Relative 14 %   Lymphs Abs 1.1 0.7 - 4.0 K/uL   Monocytes Relative 6 %   Monocytes Absolute 0.5 0.1 - 1.0 K/uL   Eosinophils Relative 2 %   Eosinophils Absolute 0.2 0.0 - 0.5 K/uL   Basophils Relative 1 %   Basophils Absolute 0.1 0.0 - 0.1 K/uL   Immature Granulocytes 1 %   Abs Immature Granulocytes 0.05 0.00 - 0.07 K/uL    Comment: Performed at [facility], [address] [place], [place] [address]  Troponin I - Once     Status: None   Collection Time: 06/11/18  8:50 AM  Result Value Ref Range   Troponin I <0.03 <0.03 ng/mL    Comment: Performed at [facility], [address] [place], [place] [address]  Brain natriuretic peptide     Status: Abnormal   Collection Time: 06/11/18  8:50 AM  Result Value Ref Range   B Natriuretic Peptide 295.0 (H) 0.0 - 100.0 pg/mL    Comment: Performed at [facility], [address] [place], [place] [address]  Troponin I - Now Then Q6H     Status: None    Collection Time: 06/11/18 12:20 PM  Result Value Ref Range   Troponin I <0.03 <0.03 ng/mL    Comment: Performed at [facility], [address] [place], [place] [address]  Respiratory Panel by PCR     Status: None   Collection Time: 06/11/18 12:57 PM  Result Value Ref Range   Adenovirus NOT DETECTED NOT DETECTED   Coronavirus 229E NOT DETECTED NOT DETECTED    Comment: (NOTE) The Coronavirus on the Respiratory Panel, DOES NOT test for the novel  Coronavirus (2019 nCoV)    Coronavirus HKU1 NOT DETECTED NOT DETECTED   Coronavirus NL63 NOT DETECTED NOT DETECTED   Coronavirus OC43 NOT DETECTED NOT DETECTED   Metapneumovirus NOT DETECTED NOT DETECTED   Rhinovirus / Enterovirus NOT DETECTED NOT DETECTED   Influenza A NOT DETECTED NOT DETECTED   Influenza B NOT DETECTED NOT DETECTED   Parainfluenza Virus 1 NOT DETECTED NOT DETECTED   Parainfluenza Virus 2 NOT DETECTED NOT DETECTED   Parainfluenza Virus 3 NOT DETECTED NOT DETECTED   Parainfluenza Virus 4 NOT DETECTED NOT DETECTED   Respiratory Syncytial Virus NOT DETECTED NOT DETECTED   Bordetella pertussis NOT DETECTED NOT DETECTED   Chlamydophila pneumoniae NOT DETECTED NOT DETECTED   Mycoplasma pneumoniae NOT DETECTED NOT DETECTED    Comment: Performed at [facility], [address] [place], [place] [address]  Influenza panel by PCR (type A & B)     Status: None   Collection Time: 06/11/18 12:57 PM  Result Value Ref Range   Influenza A By PCR NEGATIVE NEGATIVE   Influenza B By PCR NEGATIVE NEGATIVE    Comment: (NOTE) The Xpert Xpress Flu assay is intended as an aid in the diagnosis of  influenza and should not be used as a sole basis for treatment.  This  assay is FDA approved for nasopharyngeal swab specimens only. Nasal  washings and aspirates are unacceptable for Xpert Xpress Flu testing. Performed at [facility], [address] [place], [place] [address]   Troponin I - Now Then Q6H     Status:  None   Collection Time: 06/11/18  7:42 PM  Result Value Ref Range   Troponin I <0.03 <0.03 ng/mL    Comment: Performed at [facility], [address] [place], [place] [address]  Basic metabolic panel     Status: Abnormal   Collection Time: 06/12/18  4:34 AM  Result Value Ref Range   Sodium 131 (L) 135 - 145 mmol/L   Potassium 3.8 3.5 - 5.1 mmol/L   Chloride 97 (L) 98 - 111 mmol/L   CO2 26 22 - 32 mmol/L   Glucose, Bld 97 70 - 99 mg/dL   BUN 13 8 - 23 mg/dL   Creatinine, Ser 1.93 0.61 - 1.24 mg/dL   Calcium 8.3 (L) 8.9 - 10.3 mg/dL   GFR calc non Af Amer >60 >60 mL/min   GFR calc Af Amer >60 >60 mL/min   Anion gap 8 5 - 15    Comment: Performed at [facility], [address] [place], [place] [address]  CBC     Status: Abnormal   Collection Time: 06/12/18  4:34 AM  Result Value Ref Range   WBC 7.9 4.0 - 10.5 K/uL   RBC 3.09 (L) 4.22 - 5.81 MIL/uL   Hemoglobin 10.2 (L) 13.0 - 17.0 g/dL   HCT 09.7 (L) 35.3 - 29.9 %   MCV 97.7 80.0 - 100.0 fL   MCH 33.0 26.0 - 34.0 pg   MCHC 33.8 30.0 - 36.0 g/dL   RDW 24.2 68.3 - 41.9 %   Platelets 147 (L) 150 - 400 K/uL   nRBC 0.0 0.0 - 0.2 %    Comment: Performed at [facility], [address] [place], [place] [address]  Lipid panel     Status: Abnormal   Collection Time: 06/12/18  4:34 AM  Result Value Ref Range   Cholesterol 67 0 - 200 mg/dL   Triglycerides 29 <798 mg/dL   HDL 36 (L) >92 mg/dL   Total CHOL/HDL Ratio 1.9 RATIO   VLDL 6 0 - 40 mg/dL   LDL Cholesterol 25 0 - 99 mg/dL    Comment:        Total Cholesterol/HDL:CHD Risk Coronary Heart Disease Risk Table                     Men   Women  1/2 Average Risk   3.4   3.3  Average Risk       5.0   4.4  2 X Average Risk   9.6   7.1  3 X Average Risk  23.4   11.0        Use the calculated Patient Ratio above and the CHD Risk Table to determine the patient's CHD Risk.        ATP III CLASSIFICATION (LDL):  <100     mg/dL   Optimal  119-417   mg/dL   Near or Above                    Optimal  130-159  mg/dL   Borderline  408-144  mg/dL   High  >818     mg/dL   Very High Performed at [facility], [address] [place], [place] [address]     Dg Chest Portable 1 View  Result Date: 06/11/2018 CLINICAL DATA:  Chest pain and possible myocardial infarction. EXAM: PORTABLE CHEST 1 VIEW COMPARISON:  09/16/2017 FINDINGS: Stable heart size and appearance of biventricular pacer. Stable elevation of the right hemidiaphragm with volume loss of the right lung. Stable chronic lung disease. There may be mild pulmonary interstitial edema. No overt airspace edema or pleural effusions. No pneumothorax. IMPRESSION: Possible mild pulmonary interstitial edema. Stable  chronic lung disease, elevated right hemidiaphragm and right lung volume loss. Electronically Signed   By: Irish Lack M.D.   On: 06/11/2018 09:08    Review of Systems  Constitutional: Positive for malaise/fatigue.  HENT: Positive for congestion.   Eyes: Negative.   Respiratory: Positive for shortness of breath.   Cardiovascular: Positive for chest pain.  Gastrointestinal: Positive for heartburn and nausea.  Genitourinary: Negative.   Musculoskeletal: Negative.   Skin: Negative.   Neurological: Positive for weakness.  Endo/Heme/Allergies: Negative.   Psychiatric/Behavioral: Negative.    Blood pressure (!) 152/82, pulse 69, temperature 98.8 F (37.1 C), temperature source Oral, resp. rate 18, height  (1.753 m), weight 91.1 kg, SpO2 99 %. Physical Exam  Nursing note and vitals reviewed. Constitutional: He is oriented to person, place, and time. He appears well-developed and well-nourished.  HENT:  Head: Normocephalic and atraumatic.  Eyes: Pupils are equal, round, and reactive to light. Conjunctivae and EOM are normal.  Cardiovascular: Normal rate and regular rhythm.  Murmur heard. Respiratory: Effort normal and breath sounds normal.  GI: Soft. Bowel sounds  are normal.  Musculoskeletal: Normal range of motion.  Neurological: He is alert and oriented to person, place, and time. He has normal reflexes.  Skin: Skin is warm and dry.  Psychiatric: He has a normal mood and affect.    Assessment/Plan: Generalized chest pain Complete heart block Hyperlipidemia Hypertension Permanent pacemaker Former smoker COPD Hyperlipidemia Nausea Hyponatremia Anemia Congestive heart failure diastolic . Plan Recommend agree with admit to telemetry Agree with rule out for myocardial infarction Cardiac enzymes and EKG Diuretic therapy for heart failure supplemental oxygen as necessary Supplemental oxygen therapy for hypoxemia Consider echocardiogram for assessment of heart failure Continue hypertension management with metoprolol ARB Cardizem Recommend conservative medical therapy Recommend aggressive diuresis supplemental oxygen as well as inhalers to help with shortness of breath  Wonder Donaway D Eduin Friedel 06/12/2018, 1:56 PM

## 2018-06-12 NOTE — TOC Initial Note (Signed)
Transition of Care [facility]) - Initial/Assessment Note    Patient Details  Name: Gary York MRN: [id number] Date of Birth: [birth year]  Transition of Care [facility]) CM/SW Contact:    Sherren Kerns, RN Phone Number: 06/12/2018, 12:05 PM  Clinical Narrative:   [facility] from [facility] [place].  He has 24 hr. Caregivers.  No oxygen in the home.  Has a walker and a cane.  Current with PCP.   Has hx of COPD and CHF.  Offered home health and patient and daughter would only like a nurse to come.  Referral to [facility] for RN/ReDS Vest.  Will notify her at discharge.  Denies difficulties obtaining medications or with accessing healthcare.                Expected Discharge Plan: Home w Home Health Services Barriers to Discharge: Continued Medical Work up   Patient Goals and CMS Choice Patient states their goals for this hospitalization and ongoing recovery are:: Discharge to home with home health nurse CMS Medicare.gov Compare Post Acute Care list provided to:: Patient Choice offered to / list presented to : Patient  Expected Discharge Plan and Services Expected Discharge Plan: Home w Home Health Services   Discharge Planning Services: CM Consult, HF Clinic Post Acute Care Choice: Home Health Living arrangements for the past 2 months: Single Family Home                     HH Arranged: RN(ReDS Vest) HH Agency: [facility] (now [facility])  Prior Living Arrangements/Services Living arrangements for the past 2 months: Single Family Home Lives with:: Self   Do you feel safe going back to the place where you live?: Yes               Activities of Daily Living Home Assistive Devices/Equipment: Cane (specify quad or straight) ADL Screening (condition at time of admission) Patient's cognitive ability adequate to safely complete daily activities?: Yes Is the patient deaf or have difficulty hearing?: No Does the patient have difficulty seeing, even when  wearing glasses/contacts?: No Does the patient have difficulty concentrating, remembering, or making decisions?: No Patient able to express need for assistance with ADLs?: Yes Does the patient have difficulty dressing or bathing?: No Independently performs ADLs?: Yes (appropriate for developmental age) Does the patient have difficulty walking or climbing stairs?: No Weakness of Legs: None Weakness of Arms/Hands: None  Permission Sought/Granted Permission sought to share information with : Facility Industrial/product designer granted to share information with : Yes, Verbal Permission Granted  Share Information with NAME: Rosey Bath with [facility]            Emotional Assessment Appearance:: Appears stated age Attitude/Demeanor/Rapport: Gracious Affect (typically observed): Accepting Orientation: : Oriented to Self, Oriented to Place Alcohol / Substance Use: Not Applicable    Admission diagnosis:  Hypoxia [R09.02] Chest pain, unspecified type [R07.9] Congestive heart failure, unspecified HF chronicity, unspecified heart failure type [facility]) [I50.9] Patient Active Problem List   Diagnosis Date Noted  . CHF (congestive heart failure) (HCC) 09/16/2017  . Chest pain 09/09/2017   PCP:  Marisue Ivan, MD Pharmacy:   [facility] [phone] [place], [place] - [address] [place] [address] Phone: [phone] Fax: [phone]     Social Determinants of Health (SDOH) Interventions    Readmission Risk Interventions No flowsheet data found.

## 2018-06-12 NOTE — Evaluation (Signed)
Physical Therapy Evaluation Patient Details Name: Gary York MRN: [id number] DOB: [birth year] Today's Date: 06/12/2018   History of Present Illness  From MD H&P: Pt is a 85 y.o. male presents the hospital with not feeling good.  Patient had to be reminded on why he came to the hospital.  He complains of shortness of breath.  He states he cannot take a deep breath.  Complains of some tightness in the chest but cannot explain when it started or how severe it was.  He states he is currently not having chest pain.  Some cough.  Some wheeze.  He lives at the [facility] and has a 24-hour live-in and they are healthy.  No travel history.  Assessment includes: Acute hypoxic respiratory failure, Acute diastolic congestive heart failure, chest pain with h/o chest pain, HTN, HLD, weakness, and hyponatremia.      Clinical Impression  Pt Ind with bed mobility and SBA with transfers from various height surfaces including standard toilet.  Pt was able to amb 50' with SBA without an AD per pt request.  Pt ambulate with a slow cadence and tended to reach for surfaces while ambulating but overall was steady without LOB.  Pt was able to perform dynamic balance activities during functional tasks such as washing and drying hands and reaching down to open the trash can lid all without noted instability.  Per pt's daughter the pt has 24/7 supervision and assistance at home from [facility] and appears to be at baseline functionally.  Pt and daughter both state that no further PT services are needed.  Will complete PT orders at this time but will reassess pt at a future date/time pending a change in status upon receipt of new PT orders.     Follow Up Recommendations No PT follow up    Equipment Recommendations  None recommended by PT    Recommendations for Other Services       Precautions / Restrictions Precautions Precautions: Fall Restrictions Weight Bearing Restrictions: No      Mobility  Bed  Mobility Overal bed mobility: Independent                Transfers Overall transfer level: Needs assistance Equipment used: None Transfers: Sit to/from Stand Sit to Stand: Supervision         General transfer comment: Good eccentric and concentric control including from standard height toilet  Ambulation/Gait   Gait Distance (Feet): 50 Feet with SBA Assistive device: None Gait Pattern/deviations: Step-through pattern;Decreased step length - right;Decreased step length - left Gait velocity: decreased   General Gait Details: Slow cadence in room with pt refusing RW but reaching out to hold surfaces in the room while ambulating; overall pt steady without LOB  Stairs            Wheelchair Mobility    Modified Rankin (Stroke Patients Only)       Balance Overall balance assessment: Needs assistance   Sitting balance-Leahy Scale: Normal     Standing balance support: During functional activity;No upper extremity supported Standing balance-Leahy Scale: Good Standing balance comment: Pt steady washing and drying hands and reaching down to lift the lid of the trash can all without UE support                             Pertinent Vitals/Pain Pain Assessment: No/denies pain    Home Living Family/patient expects to be discharged to:: Private residence(Pt is  a resident at [facility]; daughter at bedside assisted with history) Living Arrangements: Other (Comment)(24/7 PCA) Available Help at Discharge: Personal care attendant;Available 24 hours/day Type of Home: Independent living facility Home Access: Level entry     Home Layout: One level Home Equipment: Walker - 2 wheels;Cane - single point;Wheelchair - [employer];Shower seat;Grab bars - tub/shower;Grab bars - toilet      Prior Function Level of Independence: Needs assistance   Gait / Transfers Assistance Needed: Pt Mod Ind with amb limited community distances with a SPC, no fall  history  ADL's / Homemaking Assistance Needed: 24/7 PCA assists with meals, meds, cleaning, etc.. but pt Ind with bathing and dressing; daughter drives pt to appointments        Hand Dominance        Extremity/Trunk Assessment   Upper Extremity Assessment Upper Extremity Assessment: Overall WFL for tasks assessed    Lower Extremity Assessment Lower Extremity Assessment: Overall WFL for tasks assessed       Communication   Communication: No difficulties  Cognition Arousal/Alertness: Awake/alert Behavior During Therapy: Flat affect Overall Cognitive Status: History of cognitive impairments - at baseline                                        General Comments      Exercises     Assessment/Plan    PT Assessment Patent does not need any further PT services  PT Problem List         PT Treatment Interventions      PT Goals (Current goals can be found in the Care Plan section)  Acute Rehab PT Goals PT Goal Formulation: All assessment and education complete, DC therapy    Frequency     Barriers to discharge        Co-evaluation               AM-PAC PT "6 Clicks" Mobility  Outcome Measure Help needed turning from your back to your side while in a flat bed without using bedrails?: None Help needed moving from lying on your back to sitting on the side of a flat bed without using bedrails?: None Help needed moving to and from a bed to a chair (including a wheelchair)?: None Help needed standing up from a chair using your arms (e.g., wheelchair or bedside chair)?: None Help needed to walk in hospital room?: None Help needed climbing 3-5 steps with a railing? : A Little 6 Click Score: 23    End of Session   Activity Tolerance: Patient tolerated treatment well Patient left: in bed;with call bell/phone within reach;with bed alarm set;with family/visitor present Nurse Communication: Mobility status;Other (comment)(SpO2 down to 87-88% after  amb increasing back to 91% upon returning to supine ) [name]    Time: 3295-1884 PT Time Calculation (min) (ACUTE ONLY): 24 min   Charges:   PT Evaluation $PT Eval Low Complexity: 1 Low          D. Scott Emya Picado PT, DPT 06/12/18, 11:08 AM

## 2018-06-12 NOTE — Progress Notes (Addendum)
Pt states that he can not sleep. Notify Dr. Anne Hahn. Will continue to monitor.  Update 0048: Dr. Anne Hahn ordered Benadryl 25 mg Oral PRN for sleep. Will continue to monitor.

## 2018-06-12 NOTE — Consult Note (Addendum)
PULMONARY CONSULTATION      CHIEF COMPLAINT:  Shortness of breath  x1 day   HPI   This is a pleasant 85 year old male with a history of CHF with reduced EF at 45% per TTE done on admission yesterday.  He also has a pacemaker which is over 6 years old.  He has a history of COPD however states that he is a never smoker however did have few cigars in his life.  He does endorse using inhalers as needed in the past. He lives at [facility] currently.  Patient states that over the last several years he has noticed progressively worsening fatigue throughout the day which has caused him to be more deconditioned, he also endorses chronic mild lower extremity edema as well as chronic mild cough with thickened phlegm on expectorate that he is able to bring up after multiple attempts.  He states he is not quite sure why he was sent to the hospital he denies flulike illness recently denies constitutional symptoms, he denies high-volume phlegm production, does not recall having concerning episodes of shortness of breath, has not had altered mentation and denies dementia at baseline.  I called [facility] and spoke to [employer] and she was unable to specify any details of his symptoms prior to arrival.  Lab work on arrival does show a mildly elevated BNP.  In the past patient did have a resistant E. coli UTI based on review of records.  He also had a phone call from his daughter on June 08, 2018 with report that patient is having worsening nasal rhinitis and cough without fevers or flulike illness and was prescribed Flonase as well as Mucinex by primary care team.    PAST MEDICAL HISTORY   Past Medical History:  Diagnosis Date  . CAD (coronary artery disease)   . Complete heart block (HCC)   . Hyperlipidemia   .  Hypertension      SURGICAL HISTORY   Past Surgical History:  Procedure Laterality Date  . PACEMAKER IMPLANT       FAMILY HISTORY   Family History  Problem Relation Age of Onset  . Dementia Mother   . Diabetes Mother   . Dementia Father      SOCIAL HISTORY   Social History   Tobacco Use  . Smoking status: Former Smoker    Packs/day: 1.00    Years: 20.00    Pack years: 20.00    Types: Cigarettes    Last attempt to quit: 1990    Years since quitting: 30.2  . Smokeless tobacco: Never Used  Substance Use Topics  . Alcohol use: Not Currently  . Drug use: Never     MEDICATIONS   Current Medication:  Current Facility-Administered Medications:  .  acetaminophen (TYLENOL) tablet 650 mg, 650 mg, Oral, Q6H PRN **OR** acetaminophen (TYLENOL) suppository 650 mg, 650 mg, Rectal, Q6H PRN, Wieting, Richard, MD .  albuterol (PROVENTIL) (2.5 MG/3ML) 0.083% nebulizer solution 2.5 mg, 2.5 mg, Nebulization, Q6H PRN, Renae Gloss, Richard, MD, 2.5 mg at 06/12/18 1052 .  aspirin EC tablet 81 mg, 81 mg, Oral, Daily, Renae Gloss, Richard, MD, 81 mg at 06/12/18 1049 .  atorvastatin (LIPITOR) tablet 80 mg, 80 mg, Oral, q1800, Alford Highland, MD, 80 mg at 06/11/18 1802 .  budesonide (PULMICORT) nebulizer solution 0.5 mg, 0.5 mg, Nebulization, BID, Renae Gloss, Richard, MD, 0.5 mg at 06/11/18 2017 .  clopidogrel (PLAVIX) tablet 75 mg, 75 mg, Oral, Daily, Renae Gloss, Richard, MD, 75 mg at  06/12/18 1049 .  diltiazem (CARDIZEM CD) 24 hr capsule 180 mg, 180 mg, Oral, Daily, Wieting, Richard, MD, 180 mg at 06/12/18 1049 .  diphenhydrAMINE (BENADRYL) capsule 25 mg, 25 mg, Oral, QHS PRN, Oralia Manis, MD, 25 mg at 06/12/18 0052 .  doxazosin (CARDURA) tablet 2 mg, 2 mg, Oral, Daily, Renae Gloss, Richard, MD, 2 mg at 06/12/18 1105 .  enoxaparin (LOVENOX) injection 40 mg, 40 mg, Subcutaneous, Q24H, Wieting, Richard, MD, 40 mg at 06/12/18 1105 .  furosemide (LASIX) injection 20 mg, 20 mg, Intravenous, BID, Renae Gloss,  Richard, MD, 20 mg at 06/12/18 0811 .  guaiFENesin-dextromethorphan (ROBITUSSIN DM) 100-10 MG/5ML syrup 5 mL, 5 mL, Oral, Q4H PRN, Delfino Lovett, MD, 5 mL at 06/12/18 0626 .  isosorbide mononitrate (IMDUR) 24 hr tablet 60 mg, 60 mg, Oral, Daily, Renae Gloss, Richard, MD, 60 mg at 06/12/18 1049 .  losartan (COZAAR) tablet 100 mg, 100 mg, Oral, Daily, Wieting, Richard, MD, 100 mg at 06/12/18 1049 .  magnesium hydroxide (MILK OF MAGNESIA) suspension 30 mL, 30 mL, Oral, QHS PRN, Wieting, Richard, MD .  metoprolol tartrate (LOPRESSOR) tablet 25 mg, 25 mg, Oral, BID, Renae Gloss, Richard, MD, 25 mg at 06/12/18 1050 .  nitroGLYCERIN (NITROSTAT) SL tablet 0.4 mg, 0.4 mg, Sublingual, Q5 min PRN, Wieting, Richard, MD .  ondansetron (ZOFRAN) tablet 4 mg, 4 mg, Oral, Q6H PRN **OR** ondansetron (ZOFRAN) injection 4 mg, 4 mg, Intravenous, Q6H PRN, Alford Highland, MD, 4 mg at 06/12/18 4540 .  pantoprazole (PROTONIX) EC tablet 40 mg, 40 mg, Oral, Daily, Renae Gloss, Richard, MD, 40 mg at 06/12/18 1049 .  polyethylene glycol (MIRALAX / GLYCOLAX) packet 17 g, 17 g, Oral, Daily PRN, Alford Highland, MD, 17 g at 06/11/18 1331    ALLERGIES   Patient has no known allergies.    REVIEW OF SYSTEMS     10 system ROS is conducted and is only positive for fatigue, cough with expectorant that is difficult to bring up, mild lower extremity edema, excessive daytime sleepiness.  PHYSICAL EXAMINATION   Vitals:   06/12/18 1050 06/12/18 1058  BP: (!) 152/82   Pulse: 69   Resp:    Temp:    SpO2:  99%    GENERAL no apparent distress HEAD: Normocephalic, atraumatic.  EYES: Pupils equal, round, reactive to light.  No scleral icterus.  MOUTH: Moist mucosal membrane. NECK: Supple. No thyromegaly. No nodules. No JVD.  PULMONARY: Priest breath sounds bilaterally without rhonchi however mild wheezing is notable bilaterally CARDIOVASCULAR: S1 and S2. Regular rate and rhythm. No murmurs, rubs, or gallops.  GASTROINTESTINAL: Soft,  nontender, non-distended. No masses. Positive bowel sounds. No hepatosplenomegaly.  MUSCULOSKELETAL: No swelling, clubbing, or edema.  NEUROLOGIC: Mild distress due to acute illness SKIN:intact,warm,dry   LABS AND IMAGING     -I personally reviewed most recent blood work, imaging and microbiology - significant findings today are hyponatremia.  LAB RESULTS: Recent Labs  Lab 06/11/18 0850 06/12/18 0434  NA 129* 131*  K 3.8 3.8  CL 97* 97*  CO2 25 26  BUN 12 13  CREATININE 0.88 0.77  GLUCOSE 136* 97   Recent Labs  Lab 06/11/18 0850 06/12/18 0434  HGB 11.0* 10.2*  HCT 32.2* 30.2*  WBC 7.9 7.9  PLT 165 147*     IMAGING RESULTS: No results found.         ASSESSMENT AND PLAN    Cough, wheezing, fatigue and dyspnea with minimal exertion -Likely multifactorial including COPD, systolic CHF advanced age, deconditioning -Currently with moderate  COPD exacerbation -Unable to bring up thick phlegm-we will start Mucomyst twice daily -COPD care path with Pulmicort and Zithromax as well as duoneb nebulizer therapy -PT OT to improve conditioning while inpatient -Mild acute systolic CHF exacerbation-Lasix IV -Reviewed chest x-ray and previous CT scan-chronic right middle lobe and right lower lobe infiltrate suggestive of aspiration-patient does endorse choking on food-would recommend aspiration precautions -Hyponatremia-unclear etiology possible low-sodium diet will contribute to confusional state would recommend monitoring should improve with Lasix -Made appointment for patient to be closely followed up post hospital discharge at [facility] pulmonology with Dr. Meredeth Ide  Thank you for consultation we will follow along with you.  This document was prepared using Dragon voice recognition software and may include unintentional dictation errors.    Vida Rigger, M.D.  Division of Pulmonary & Critical Care Medicine  [facility]

## 2018-06-13 LAB — BASIC METABOLIC PANEL
Anion gap: 8 (ref 5–15)
BUN: 14 mg/dL (ref 8–23)
CHLORIDE: 94 mmol/L — AB (ref 98–111)
CO2: 26 mmol/L (ref 22–32)
Calcium: 8.2 mg/dL — ABNORMAL LOW (ref 8.9–10.3)
Creatinine, Ser: 1.02 mg/dL (ref 0.61–1.24)
GFR calc Af Amer: >60 mL/min (ref >60)
GFR calc non Af Amer: >60 mL/min (ref >60)
Glucose, Bld: 99 mg/dL (ref 70–99)
Potassium: 3.9 mmol/L (ref 3.5–5.1)
Sodium: 128 mmol/L — ABNORMAL LOW (ref 135–145)

## 2018-06-13 LAB — CBC
HCT: 30.4 % — ABNORMAL LOW (ref 39.0–52.0)
HEMOGLOBIN: 10.5 g/dL — AB (ref 13.0–17.0)
MCH: 33.7 pg (ref 26.0–34.0)
MCHC: 34.5 g/dL (ref 30.0–36.0)
MCV: 97.4 fL (ref 80.0–100.0)
NRBC: 0 % (ref 0.0–0.2)
Platelets: 149 10*3/uL — ABNORMAL LOW (ref 150–400)
RBC: 3.12 MIL/uL — AB (ref 4.22–5.81)
RDW: 13.1 % (ref 11.5–15.5)
WBC: 7.9 10*3/uL (ref 4.0–10.5)

## 2018-06-13 MED ORDER — DIPHENHYDRAMINE HCL 25 MG PO CAPS: 25.0000 mg | ORAL_CAPSULE | Freq: Every evening | ORAL | Status: DC | PRN

## 2018-06-13 MED ORDER — ALBUTEROL SULFATE (2.5 MG/3ML) 0.083% IN NEBU
2.5000 mg | INHALATION_SOLUTION | Freq: Two times a day (BID) | RESPIRATORY_TRACT | Status: DC
  Administered 2018-06-13: 2.5 mg via RESPIRATORY_TRACT
  Filled 2018-06-13 (×3): qty 3

## 2018-06-13 NOTE — Progress Notes (Signed)
[employer] [facility] [place] [facility]   PATIENT NAME: Gary York    MR#:  [id number]  DATE OF BIRTH:  03-23-1922  SUBJECTIVE:  CHIEF COMPLAINT:   Chief Complaint  Patient presents with  . Chest Pain   Patient evaluated by speech therapist due to concern for possible aspiration this morning.  Having some intermittent nonproductive cough.  Shortness of breath improving.  No fevers.  Appetite daughter present at bedside on treatment plans.   REVIEW OF SYSTEMS:  CONSTITUTIONAL: No fever, fatigue, or weakness.  EYES: No blurred or double vision.  EARS, NOSE, AND THROAT: No tinnitus or ear pain. No sore throat RESPIRATORY: Positive for cough, shortness of breath, and wheezing.  No hemoptysis.  CARDIOVASCULAR:  Negative for chest pain or discomfort today. no orthopnea, edema.  GASTROINTESTINAL: No nausea, vomiting, diarrhea or abdominal pain. No blood in bowel movements GENITOURINARY: No dysuria, hematuria.  ENDOCRINE: No polyuria, nocturia,  HEMATOLOGY: No anemia, easy bruising or bleeding SKIN: No rash or lesion. MUSCULOSKELETAL:  Denies joint and back pain  NEUROLOGIC: No tingling, numbness, weakness.  PSYCHIATRY: Denies anxiety or depression today DRUG ALLERGIES:  No Known Allergies VITALS:  Blood pressure 139/76, pulse 70, temperature 98.1 F (36.7 C), temperature source Oral, resp. rate 19, height 5\' 9"  (1.753 m), weight 92 kg, SpO2 93 %. PHYSICAL EXAMINATION:  GENERAL:  85 y.o.-year-old patient sitting up at the edge of the bed with no acute distress.  EYES: Pupils equal, round, reactive to light and accommodation. No scleral icterus. Extraocular muscles intact.  HEENT: Head atraumatic, normocephalic. Oropharynx and nasopharynx clear.  NECK:  Supple, no jugular venous distention. No thyroid enlargement, no tenderness.  LUNGS: Decreased breath sounds bilaterally, slight expiratory wheezing bilaterally. No use of accessory muscles of respiration.   CARDIOVASCULAR: S1, S2 normal. No murmurs, rubs, or gallops.  ABDOMEN: Soft, nontender, nondistended. Bowel sounds present. No organomegaly or mass.  EXTREMITIES: Trace pedal edema. No cyanosis, or clubbing.  NEUROLOGIC: Cranial nerves II through XII are intact. Muscle strength 5/5 in all extremities. Sensation intact. Gait not checked.  PSYCHIATRIC: The patient is alert and oriented x3. However he requires some questions to be asked twice and looks to daughter to answer some questions about symptoms. SKIN: No rash, lesion, or ulcer.  LABORATORY PANEL:  Male CBC Recent Labs  Lab 06/13/18 0429  WBC 7.9  HGB 10.5*  HCT 30.4*  PLT 149*   ------------------------------------------------------------------------------------------------------------------ Chemistries  Recent Labs  Lab 06/11/18 0850  06/13/18 0429  NA 129*   < > 128*  K 3.8   < > 3.9  CL 97*   < > 94*  CO2 25   < > 26  GLUCOSE 136*   < > 99  BUN 12   < > 14  CREATININE 0.88   < > 1.02  CALCIUM 8.4*   < > 8.2*  AST 22  --   --   ALT 18  --   --   ALKPHOS 44  --   --   BILITOT 0.9  --   --    < > = values in this interval not displayed.   RADIOLOGY:  No results found. ASSESSMENT AND PLAN:   Gary York  is a 85 y.o. male with past medical history of CAD, CHF with LVEF 45%, COPD followed by Dr. Meredeth Ide, complete heart block status post pacemaker implant reportedly 30 years ago, hyperlipidemia and hypertension  who presents the hospital with malaise and shortness of breath  associated with cough and wheezing. Complained of some tightness in the chest but cannot explain when it started or how severe it was. He lives at the [facility] and has a 24-hour live-in and they are healthy. No travel history or known sick contacts otherwise.  1.  Acute hypoxic respiratory failure.  Significantly improved clinically.  Oxygen saturation of 93% on room air this morning.  Influenza test negative. Respiratory panel  negative. Unlikely candidate for Covid-19.  2.  Acute diastolic congestive heart failure.  Diuresis with IV Lasix low-dose. Patient already on metoprolol and ARB.  Cardiology consultation. Seen by Dr. Juliann Pares who recommends continue diuresis, echo already done shows LVEF 45%.  3.  Chest pain with history of chest pain.  Serial troponins, last 3 have been less than 0.03. Monitor on telemetry.  Continue aspirin Plavix and beta-blocker.  Patient remains asymptomatic  4.  COPD exacerbation continue nebulizers DuoNeb and budesonide.  Held off on steroids until flu and respiratory panel back which are negative. Consult to pulmonology Dr. Karna Christmas: Started Mucomyst twice daily to help with bringing up sputum.  Pulmicort.  Zithromax and DuoNeb therapy.   Recommended aspiration precautions. Seen by speech therapist this morning.  Recommended continuing regular diet with thin liquids.  Aspiration precautions.  Reflux precautions.  Patient already on PPI.  Patient seen by physical therapist.  No physical therapy follow-up recommended. Outpatient follow-up with [facility] pulmonology  5.  Hypertension continue metoprolol, ARB and Cardizem  6.  Hyperlipidemia.  On atorvastatin  7.  Weakness.  Physical therapy  8.  Hyponatremia likely from CHF. Continue to monitor with diuresis.  DVT prophylaxis; Lovenox  All the records are reviewed and case is discussed with Care Management/Social Worker. Management plans discussed with the patient and/or family and they are in agreement.  CODE STATUS: DNR  TOTAL TIME TAKING CARE OF THIS PATIENT: 28 minutes.   More than 50% of the time was spent in counseling/coordination of care: YES  POSSIBLE D/C IN 1 DAYS, DEPENDING ON CLINICAL CONDITION.   Gary York , MD on 06/13/2018 at 1:06 PM  Between 7am to 6pm - Pager - [phone]  After 6 pm go to [email or web address] - Social research officer, government  [employer] [place] [facility]  Office  [phone]  CC: Primary care  physician; Marisue Ivan, MD  Note: This dictation was prepared with Dragon dictation along with smaller phrase technology. Any transcriptional errors that result from this process are unintentional.

## 2018-06-13 NOTE — Progress Notes (Signed)
Pt refused chest vest for 0600 hr, stated during his 2023 hr treatment on 03/20 he did not want to be "shook up, first thing in the morning."

## 2018-06-13 NOTE — Progress Notes (Signed)
Refused bed alarm while daughter is in room. Both educated on safety. Will continue to monitor.

## 2018-06-13 NOTE — Plan of Care (Signed)
  Problem: Education: Goal: Knowledge of General Education information will improve Description: Including pain rating scale, medication(s)/side effects and non-pharmacologic comfort measures Outcome: Progressing   Problem: Activity: Goal: Risk for activity intolerance will decrease Outcome: Progressing   Problem: Activity: Goal: Capacity to carry out activities will improve Outcome: Progressing   

## 2018-06-13 NOTE — Evaluation (Addendum)
Clinical/Bedside Swallow Evaluation Patient Details  Name: Gary York MRN: [id number] Date of Birth: [birth year]  Today's Date: 06/13/2018 Time: SLP Start Time (ACUTE ONLY): 0930 SLP Stop Time (ACUTE ONLY): 1030 SLP Time Calculation (min) (ACUTE ONLY): 60 min  Past Medical History:  Past Medical History:  Diagnosis Date  . CAD (coronary artery disease)   . Complete heart block (HCC)   . Hyperlipidemia   . Hypertension    Past Surgical History:  Past Surgical History:  Procedure Laterality Date  . PACEMAKER IMPLANT     HPI:  Pt is a 85 y.o. male w/ h/o GERD/Reflux per pt, diastolic congestive heart failure, CAD, COPD, h/o chest pain and Stemi, HTN, HLD, weakness, and hyponatremia who presents the hospital with not feeling good.  Patient had to be reminded on why he came to the hospital.  He complains of shortness of breath.  He states he cannot take a deep breath.  Complains of some tightness in the chest but cannot explain when it started or how severe it was.  He states he is currently not having chest pain.  Some cough.  Some wheeze.  He lives at the [facility] [place] and has a 24-hour live-in and they are healthy.  At baseline, pt stated he has "terrbily Reflux sometimes" stating that the "type of acid is acidic acid".  Unsure of pt's full baseline Cognitive status. Pt resides at [facility] w/ 24 hr care.    Assessment / Plan / Recommendation Clinical Impression  Pt appears to present w/ adequate oropharyngeal phase swallowing function w/ no immediate, overt s/s of aspiration noted. Pt was awake, attentive and fed self sitting EOB. He was given cues to Slow Down when drinking/eating and to take only a few Pills at one time w/ liquids. Unsure of his baseline Cognitive status as he often chuckled/laughed and required intermittent verbal cues to re-attend to task during engagement. Pt described that he had "terrible Reflux" - unsure if on a PPI. Noted CXR(s) results indicating "Stable chronic  elevation of the right hemidiaphragm with chronic right base atelectasis or scarring" -- this presentation can be seen in chronic aspiration of Reflux over time.  Pt sat EOB and consumed po trials of thin liquids, purees, and soft solids w/ no immediate, overt s/s of aspiration noted; no decline in vocal quality or respiratory status noted during/post po trials. The hacking, dry cough occurred x2 during po trials but was quite similar to the cough he exhibited prior to any po's given. Pt fed self w/ setup assistance. Oral phase appeared [facility] w/ timely mastication and bolus management for A-P transfer followed by appropriate oral clearing post trials. OM exam appeared [facility] w/ no unilaterial weakness noted during movements; speech clear. Belching noted x1 during po intake.  W/ presentation today and pt's self-report of Reflux issues, recommend continue a Regular diet w/ thin liquids(cooked, moist foods easy to masticate, clear) w/ Thin liquids; general aspiration and REFLUX precautions -- MD ordering a PPI. Tray setup at meals as needed; monitoring w/ Pill swallowing. Recommend f/u w/ GI if any further c/o REFLUX issues; management of such. Any Regurgitation of Reflux and aspiration of such can impact the Pulmonary status. SLP Visit Diagnosis: Dysphagia, unspecified (R13.10)    Aspiration Risk  (reduced following general precautions)    Diet Recommendation  Regular diet w/ cooked, soft foods; Thin liquids. Aspiration precautions; REFLUX PRECAUTIONS  Medication Administration: Whole meds with liquid(but Whole in puree if needed)    Other  Recommendations Recommended Consults: Consider GI evaluation(if desired to address REflux; Dietician f/u as needed) Oral Care Recommendations: Oral care BID;Patient independent with oral care Other Recommendations: (n/a)   Follow up Recommendations None      Frequency and Duration (n/a)  (n/a)       Prognosis Prognosis for Safe Diet Advancement:  Fair(-Good) Barriers to Reach Goals: (Reflux baseline)      Swallow Study   General Date of Onset: 06/12/18 HPI: Pt is a 85 y.o. male w/ h/o GERD/Reflux per pt, diastolic congestive heart failure, CAD, COPD, h/o chest pain and Stemi, HTN, HLD, weakness, and hyponatremia who presents the hospital with not feeling good.  Patient had to be reminded on why he came to the hospital.  He complains of shortness of breath.  He states he cannot take a deep breath.  Complains of some tightness in the chest but cannot explain when it started or how severe it was.  He states he is currently not having chest pain.  Some cough.  Some wheeze.  He lives at the [facility] [place] and has a 24-hour live-in and they are healthy.  At baseline, pt stated he has "terrbily Reflux sometimes" stating that the "type of acid is acidic acid".  Unsure of pt's full baseline Cognitive status. Pt resides at [facility] w/ 24 hr care.  Type of Study: Bedside Swallow Evaluation Previous Swallow Assessment: none reported - he denied any difficulty swallowing in past but endorsed GERD Diet Prior to this Study: Regular;Thin liquids Temperature Spikes Noted: No(wbc 7.9) Respiratory Status: Nasal cannula(1 liter) History of Recent Intubation: No Behavior/Cognition: Alert;Cooperative;Pleasant mood;Distractible;Requires cueing Oral Cavity Assessment: Within Functional Limits Oral Care Completed by SLP: Recent completion by staff Oral Cavity - Dentition: Adequate natural dentition Vision: Functional for self-feeding Self-Feeding Abilities: Able to feed self;Needs set up Patient Positioning: Upright in bed(EOB sitting) Baseline Vocal Quality: Normal(often chuckled, laughed ) Volitional Cough: Strong(slight phlegm) Volitional Swallow: Able to elicit    Oral/Motor/Sensory Function Overall Oral Motor/Sensory Function: Within functional limits   Ice Chips Ice chips: Not tested   Thin Liquid Thin Liquid: Within functional limits Presentation:  Cup;Self Fed(5 trials w/ multiple sips each) Other Comments: encouraged pt to slow down    Nectar Thick Nectar Thick Liquid: Not tested   Honey Thick Honey Thick Liquid: Not tested   Puree Puree: Within functional limits Presentation: Self Fed;Spoon(4 ozs)   Solid     Solid: Within functional limits Presentation: Spoon;Self Fed(5 trials) Other Comments: then swallowed pills w/ water w/ NSG       Jerilynn Som, MS, CCC-SLP Rochelle Nephew 06/13/2018,10:55 AM

## 2018-06-13 NOTE — Progress Notes (Signed)
OT Cancellation Note  Patient Details Name: Gary York MRN: [id number] DOB: 11-13-1921   Cancelled Treatment:    Reason Eval/Treat Not Completed: OT screened, no needs identified, will sign off Met with pt sitting EOB, having just finished getting dressed in own clothing. Pt getting ready to walk in halls with dtr. At this time no OT needs identified, pt lives in facility with 24/7 care/supervision and is completing BADL at baseline. If changes arise please reconsult OT, thank you for this consult.  Zenovia Jarred, MSOT, OTR/L Behavioral Health OT/ Acute Relief OT  Zenovia Jarred 06/13/2018, 4:26 PM

## 2018-06-13 NOTE — Progress Notes (Signed)
Pt is wanting to leave AMA, demanding to speak with his daughter Lurena Joiner. Attempts have been made to contact her. 2000 hr treatments were not given due to Pt lack of cooperation.

## 2018-06-14 ENCOUNTER — Encounter: Payer: Self-pay | Admitting: Pulmonary Disease

## 2018-06-14 LAB — BASIC METABOLIC PANEL
Anion gap: 8 (ref 5–15)
BUN: 14 mg/dL (ref 8–23)
CO2: 29 mmol/L (ref 22–32)
Calcium: 8.4 mg/dL — ABNORMAL LOW (ref 8.9–10.3)
Chloride: 90 mmol/L — ABNORMAL LOW (ref 98–111)
Creatinine, Ser: 1.18 mg/dL (ref 0.61–1.24)
GFR calc Af Amer: >60 mL/min (ref >60)
GFR calc non Af Amer: 53 mL/min — ABNORMAL LOW (ref >60)
Glucose, Bld: 115 mg/dL — ABNORMAL HIGH (ref 70–99)
Potassium: 3.4 mmol/L — ABNORMAL LOW (ref 3.5–5.1)
Sodium: 127 mmol/L — ABNORMAL LOW (ref 135–145)

## 2018-06-14 LAB — MAGNESIUM: Magnesium: 2.2 mg/dL (ref 1.7–2.4)

## 2018-06-14 MED ORDER — FUROSEMIDE 20 MG PO TABS: 20.0000 mg | ORAL_TABLET | Freq: Two times a day (BID) | ORAL | 0 refills | Status: DC

## 2018-06-14 MED ORDER — PREDNISONE 10 MG PO TABS: ORAL_TABLET | ORAL | 0 refills | Status: DC

## 2018-06-14 MED ORDER — SIMETHICONE 40 MG/0.6ML PO SUSP
80.0000 mg | Freq: Once | ORAL | Status: AC
  Administered 2018-06-14: 80 mg via ORAL
  Filled 2018-06-14 (×2): qty 1.2

## 2018-06-14 MED ORDER — GUAIFENESIN-DM 100-10 MG/5ML PO SYRP: 5.0000 mL | ORAL_SOLUTION | ORAL | 0 refills | Status: AC | PRN

## 2018-06-14 MED ORDER — AZITHROMYCIN 500 MG PO TABS: 500.0000 mg | ORAL_TABLET | Freq: Every day | ORAL | 0 refills | Status: AC

## 2018-06-14 MED ORDER — METOPROLOL SUCCINATE ER 25 MG PO TB24: 25.0000 mg | ORAL_TABLET | Freq: Every day | ORAL | 0 refills | Status: DC

## 2018-06-14 NOTE — Discharge Instructions (Signed)
Angina ° °Angina is extreme discomfort in the chest, neck, arm, jaw or back. The discomfort is caused by a lack of blood in the middle layer of the heart wall (myocardium). °There are four types of angina: °· Stable angina. This is triggered by vigorous activity or exercise. It goes away when you rest or take angina medicine. °· Unstable angina. This is a warning sign and can lead to a heart attack (acute coronary syndrome). This is a medical emergency. Symptoms come at rest and last a long time. °· Microvascular angina. This affects the small coronary arteries. Symptoms include feeling tired and being short of breath. °· Prinzmetal or variant angina. This is caused by a tightening (spasm) of the arteries that go to your heart. °What are the causes? °This condition is caused by atherosclerosis. This is the buildup of fat and cholesterol (plaque) in your arteries. The plaque may narrow or block the artery. °Other causes include: °· Sudden tightening of the muscles of the arteries in the heart (coronary spasm). °· Small artery disease (microvascular dysfunction). °· Problems with any of your heart valves (heart valve disease). °· A tear in an artery in your heart (coronary artery dissection). °· Cardiomyopathy, or other heart disease. °What increases the risk? °You are more likely to develop this condition if you have: °· High cholesterol. °· High blood pressure. °· Diabetes. °· Family history of heart disease. °· Inactive (sedentary) lifestyle, or you do not exercise enough. °· Depression. °· Had radiation to the left side of your chest. °Other risk factors include: °· Using tobacco. °· Being obese. °· Eating a diet high in saturated fats. °· Being exposed to high stress or triggers of stress. °· Using drugs, such as cocaine. °Women have a greater risk for angina if: °· They are older than 55. °· They have gone through menopause (postmenopausal). °What are the signs or symptoms? °Common symptoms in both men and women  may include: °· Chest pain, which may: °? Feel like a crushing or squeezing in the chest, or a tightness, pressure, fullness, or heaviness in the chest. °? Last for more than a few minutes at a time, or it may stop and come back (recur) over the course of a few minutes. °· Pain in the neck, arm, jaw, or back. °· Unexplained heartburn or indigestion. °· Shortness of breath. °· Nausea. °· Sudden cold sweats. °Women and people with diabetes may have unusual (atypical) symptoms, such as: °· Fatigue. °· Unexplained feelings of nervousness or anxiety. °· Unexplained weakness. °· Dizziness or fainting. °How is this diagnosed? °This condition may be diagnosed based on: °· Your symptoms and medical history. °· Electrocardiogram (ECG) to measure the electrical activity in your heart. °· Blood tests. °· Stress test to look for signs of blockage when your heart is stressed. °· CT angiogram to examine your heart and the blood flow to it. °· Coronary angiogram to check your coronary arteries for blockage. °How is this treated? °Angina may be treated with: °· Medicines to: °? Prevent blood clots and heart attack. °? Relax blood vessels and improve blood flow to the heart (nitrates). °? Reduce blood pressure, improve the pumping action of the heart, and relax blood vessels that are spasming. °? Reduce cholesterol and help treat atherosclerosis. °· A procedure to widen a narrowed or blocked coronary artery (angioplasty). A mesh tube may be placed in a coronary artery to keep it open (coronary stenting). °· Surgery to allow blood to go around a blocked artery (  coronary artery bypass surgery). °Follow these instructions at home: °Medicines °· Take over-the-counter and prescription medicines only as told by your health care provider. °· Do not take the following medicines unless your health care provider approves: °? NSAIDs, such as ibuprofen, naproxen, or celecoxib. °? Vitamin supplements that contain vitamin A, vitamin E, or  both. °? Hormone replacement therapy that contains estrogen with or without progestin. °Eating and drinking ° °· Eat a heart-healthy diet. This includes plenty of fresh fruits and vegetables, whole grains, low-fat (lean) protein, and low-fat dairy products. °· Follow instructions from your health care provider about eating or drinking restrictions. °Activity °· Follow an exercise program approved by your health care provider. Join a cardiac rehabilitation program. °· Take a break when you feel fatigued. Plan rest periods in your daily activities. °Lifestyle ° °· Do not use any products that contain nicotine or tobacco, such as cigarettes and e-cigarettes. If you need help quitting, ask your health care provider. °· If your health care provider approves, limit alcohol intake to no more than 1 drink a day for women and 2 drinks a day for men. One drink equals 12 oz of beer, 5 oz of wine, or 1½ oz of hard liquor. °General instructions °· Maintain a healthy weight. °· Learn to manage stress. °· Keep your vaccinations up to date. Get the flu (influenza) vaccine every year. °· Talk to your health care provider if you feel depressed. Take a depression screening test to see if you are at risk for depression. °· Work with your health care provider to manage other health conditions, such as hypertension or diabetes. °· Keep all follow-up visits as told by your health care provider. This is important. °Get help right away if: °· You have pain in your chest, neck, arm, jaw, or back, and the pain: °? Lasts more than a few minutes. °? Is recurring. °? Is not relieved by taking medicines under the tongue (sublingual nitroglycerin). °? Increases in intensity or frequency. °· You have a lot of sweating without cause. °· You have unexplained: °? Heartburn or indigestion. °? Shortness of breath or difficulty breathing. °? Nausea or vomiting. °? Fatigue. °? Feelings of nervousness or anxiety. °? Weakness. °· You have sudden  light-headedness or dizziness. °· You faint. °These symptoms may represent a serious problem that is an emergency. Do not wait to see if the symptoms will go away. Get medical help right away. Call your local emergency services (911 in the [place] Do not drive yourself to the hospital. °Summary °· Angina is extreme discomfort in the chest, neck, or arm that is caused by a lack of blood in the heart wall. °· There are many symptoms of angina. They include chest pain or pain in the arms, neck, jaw, or back. °· Angina may be treated with behavioral changes, medicine, or surgery. °· Symptoms of angina may represent an emergency. Get medical help right away. Call your local emergency services (911 in the [place] Do not drive yourself to the hospital. °This information is not intended to replace advice given to you by your health care provider. Make sure you discuss any questions you have with your health care provider. °Document Released: 03/11/2005 Document Revised: 04/25/2017 Document Reviewed: 04/25/2017 °Elsevier Interactive Patient Education © 2019 [employer] ° °

## 2018-06-14 NOTE — Discharge Summary (Signed)
[facility] [place] [facility]   PATIENT NAME: Gary York    MR#:  [id number]  DATE OF BIRTH:  April 27, 1921  DATE OF ADMISSION:  06/11/2018   ADMITTING PHYSICIAN: Alford Highland, MD  DATE OF DISCHARGE: 05/16/2018  PRIMARY CARE PHYSICIAN: Marisue Ivan, MD   ADMISSION DIAGNOSIS:  Hypoxia [R09.02] Chest pain, unspecified type [R07.9] Congestive heart failure, unspecified HF chronicity, unspecified heart failure type (HCC) [I50.9] DISCHARGE DIAGNOSIS:  Active Problems:   CHF (congestive heart failure) (HCC)  SECONDARY DIAGNOSIS:   Past Medical History:  Diagnosis Date  . CAD (coronary artery disease)   . Complete heart block (HCC)   . Hyperlipidemia   . Hypertension    HOSPITAL COURSE:  Chief complaint; chest pain  History of presenting complaint; Gary York  is a 85 y.o. male who presented the hospital with not feeling good.  Patient had to be reminded on why he came to the hospital.  He complains of shortness of breath.  He stated he could not take a deep breath.  Complains of some tightness in the chest but cannot explain when it started or how severe it was.  He states he is currently not having chest pain.  Some cough.  Some wheeze.  He lives at the [facility] [place] and has a 24-hour live-in and they are healthy.  No travel history.  Please refer to the H&P dictated for further details   Hospital course; 1. Acute hypoxic respiratory failure.  Significantly improved clinically.  Oxygen saturation of 98% on room air this morning.  Influenza test negative. Respiratory panel negative.Unlikely candidate for Covid-19.  Remains asymptomatic.  Clinically and hemodynamically stable for discharge.  Appreciate input from pulmonologist  2. Acute diastolic congestive heart failure. Diuresis with IV Lasix low-dose. Patient already on metoprolol and ARB. Cardiology consultation.Seen by Dr. Juliann Pares.  Appreciate input.  Adequately diuresed with IV  Lasix.  Being discharged on p.o. Lasix.  Continue beta-blockers.  2D echocardiogram done shows LVEF 45%.  Patient clinically and hemodynamically stable.  Requesting to be discharged as soon as possible.  Updated daughter at bedside on treatment and discharge plans.  3. Chest pain with history of chest pain. Serial troponins, last 3 have been less than 0.03.Continue aspirin Plavix and beta-blocker.  Patient remains asymptomatic  4. COPD exacerbation continue nebulizers DuoNeb and budesonide. Held off on steroids until flu and respiratory panel back which are negative. Consult to pulmonology Dr. Karna Christmas: Appreciate input.  He recommended discharging patient on p.o. prednisone taper as well as azithromycin.  Patient seen by speech therapist during this admission. Recommended continuing regular diet with thin liquids.  Aspiration precautions.  Reflux precautions.  Patient already on PPI.  Patient seen by physical therapist.  No physical therapy follow-up recommended. Outpatient follow-up with [facility] pulmonology  5. Hypertension continue metoprolol, ARB and Cardizem  6. Hyperlipidemia.On atorvastatin  7. Weakness. Physical therapy  8. Hyponatremia likely from CHF. Continue to monitor with diuresis.   DISCHARGE CONDITIONS:  Stable CONSULTS OBTAINED:  Treatment Team:  Vida Rigger, MD DRUG ALLERGIES:  No Known Allergies DISCHARGE MEDICATIONS:   Allergies as of 06/14/2018   No Known Allergies     Medication List    STOP taking these medications   metoprolol tartrate 25 MG tablet Commonly known as:  LOPRESSOR     TAKE these medications   acetaminophen 500 MG tablet Commonly known as:  TYLENOL Take 500 mg by mouth every 6 (six) hours as needed for fever.  albuterol 108 (90 Base) MCG/ACT inhaler Commonly known as:  PROVENTIL HFA;VENTOLIN HFA Inhale 2 puffs into the lungs every 6 (six) hours as needed for wheezing or shortness of breath.   albuterol (2.5 MG/3ML)  0.083% nebulizer solution Commonly known as:  PROVENTIL Take 3 mLs (2.5 mg total) by nebulization every 6 (six) hours as needed for wheezing or shortness of breath. Dx: copd exacerbation J44.1   aspirin 81 MG EC tablet Take 1 tablet (81 mg total) by mouth daily.   atorvastatin 80 MG tablet Commonly known as:  LIPITOR Take 1 tablet (80 mg total) by mouth daily at 6 PM.   azithromycin 500 MG tablet Commonly known as:  Zithromax Take 1 tablet (500 mg total) by mouth daily for 5 days. Take 1 tablet daily for 3 days.   clopidogrel 75 MG tablet Commonly known as:  PLAVIX Take 1 tablet (75 mg total) by mouth daily.   dilTIAZem CD 180 MG 24 hr capsule Generic drug:  diltiazem Take 180 mg by mouth daily.   doxazosin 2 MG tablet Commonly known as:  CARDURA Take 2 mg by mouth daily.   Fluticasone-Salmeterol 250-50 MCG/DOSE Aepb Commonly known as:  Advair Diskus Inhale 1 puff into the lungs 2 (two) times daily.   furosemide 20 MG tablet Commonly known as:  Lasix Take 1 tablet (20 mg total) by mouth 2 (two) times daily.   guaiFENesin-dextromethorphan 100-10 MG/5ML syrup Commonly known as:  ROBITUSSIN DM Take 5 mLs by mouth every 4 (four) hours as needed for up to 5 days for cough.   isosorbide mononitrate 60 MG 24 hr tablet Commonly known as:  IMDUR Take 1 tablet (60 mg total) by mouth daily.   levalbuterol 1.25 MG/0.5ML nebulizer solution Commonly known as:  XOPENEX Take 1.25 mg by nebulization every 6 (six) hours as needed for wheezing or shortness of breath. Copd exacerbation V44.1   losartan 100 MG tablet Commonly known as:  COZAAR Take 100 mg by mouth daily.   magnesium hydroxide 400 MG/5ML suspension Commonly known as:  MILK OF MAGNESIA Take 30 mLs by mouth at bedtime as needed for mild constipation.   metoprolol succinate 25 MG 24 hr tablet Commonly known as:  Toprol XL Take 1 tablet (25 mg total) by mouth daily.   nitroGLYCERIN 0.4 MG SL tablet Commonly known  as:  Nitrostat Place 1 tablet (0.4 mg total) under the tongue every 5 (five) minutes as needed for chest pain.   pantoprazole 40 MG tablet Commonly known as:  Protonix Take 1 tablet (40 mg total) by mouth daily.   polyethylene glycol packet Commonly known as:  MIRALAX / GLYCOLAX Take 17 g by mouth daily as needed.   predniSONE 10 MG tablet Commonly known as:  DELTASONE Prednisone 40 mg p.o. daily x2 days Then 30 mg p.o. daily x2 days Then 20 mg p.o. daily x2 days Then 10 mg p.o. daily x2 days        DISCHARGE INSTRUCTIONS:   DIET:  Cardiac diet DISCHARGE CONDITION:  Stable ACTIVITY:  Activity as tolerated OXYGEN:  Home Oxygen: No.  Oxygen Delivery: room air DISCHARGE LOCATION:  home   If you experience worsening of your admission symptoms, develop shortness of breath, life threatening emergency, suicidal or homicidal thoughts you must seek medical attention immediately by calling 911 or calling your MD immediately  if symptoms less severe.  You Must read complete instructions/literature along with all the possible adverse reactions/side effects for all the Medicines you take and  that have been prescribed to you. Take any new Medicines after you have completely understood and accpet all the possible adverse reactions/side effects.   Please note  You were cared for by a hospitalist during your hospital stay. If you have any questions about your discharge medications or the care you received while you were in the hospital after you are discharged, you can call the unit and asked to speak with the hospitalist on call if the hospitalist that took care of you is not available. Once you are discharged, your primary care physician will handle any further medical issues. Please note that NO REFILLS for any discharge medications will be authorized once you are discharged, as it is imperative that you return to your primary care physician (or establish a relationship with a primary  care physician if you do not have one) for your aftercare needs so that they can reassess your need for medications and monitor your lab values.    On the day of Discharge:  VITAL SIGNS:  Blood pressure 123/61, pulse 70, temperature 98.5 F (36.9 C), temperature source Oral, resp. rate 18, height  (1.753 m), weight 92.9 kg, SpO2 95 %. PHYSICAL EXAMINATION:  GENERAL:  85 y.o.-year-old patient lying in the bed with no acute distress.  EYES: Pupils equal, round, reactive to light and accommodation. No scleral icterus. Extraocular muscles intact.  HEENT: Head atraumatic, normocephalic. Oropharynx and nasopharynx clear.  NECK:  Supple, no jugular venous distention. No thyroid enlargement, no tenderness.  LUNGS: Normal breath sounds bilaterally, no wheezing, rales,rhonchi or crepitation. No use of accessory muscles of respiration.  CARDIOVASCULAR: S1, S2 normal. No murmurs, rubs, or gallops.  ABDOMEN: Soft, non-tender, non-distended. Bowel sounds present. No organomegaly or mass.  EXTREMITIES: No pedal edema, cyanosis, or clubbing.  NEUROLOGIC: Cranial nerves II through XII are intact. Muscle strength 5/5 in all extremities. Sensation intact. Gait not checked.  PSYCHIATRIC: The patient is alert and oriented x 3.  SKIN: No obvious rash, lesion, or ulcer.  DATA REVIEW:   CBC Recent Labs  Lab 06/13/18 0429  WBC 7.9  HGB 10.5*  HCT 30.4*  PLT 149*    Chemistries  Recent Labs  Lab 06/11/18 0850  06/13/18 0429  NA 129*   < > 128*  K 3.8   < > 3.9  CL 97*   < > 94*  CO2 25   < > 26  GLUCOSE 136*   < > 99  BUN 12   < > 14  CREATININE 0.88   < > 1.02  CALCIUM 8.4*   < > 8.2*  AST 22  --   --   ALT 18  --   --   ALKPHOS 44  --   --   BILITOT 0.9  --   --    < > = values in this interval not displayed.     Microbiology Results  Results for orders placed or performed during the hospital encounter of 06/11/18  Respiratory Panel by PCR     Status: None   Collection Time:  06/11/18 12:57 PM  Result Value Ref Range Status   Adenovirus NOT DETECTED NOT DETECTED Final   Coronavirus 229E NOT DETECTED NOT DETECTED Final    Comment: (NOTE) The Coronavirus on the Respiratory Panel, DOES NOT test for the novel  Coronavirus (2019 nCoV)    Coronavirus HKU1 NOT DETECTED NOT DETECTED Final   Coronavirus NL63 NOT DETECTED NOT DETECTED Final   Coronavirus OC43 NOT DETECTED NOT  DETECTED Final   Metapneumovirus NOT DETECTED NOT DETECTED Final   Rhinovirus / Enterovirus NOT DETECTED NOT DETECTED Final   Influenza A NOT DETECTED NOT DETECTED Final   Influenza B NOT DETECTED NOT DETECTED Final   Parainfluenza Virus 1 NOT DETECTED NOT DETECTED Final   Parainfluenza Virus 2 NOT DETECTED NOT DETECTED Final   Parainfluenza Virus 3 NOT DETECTED NOT DETECTED Final   Parainfluenza Virus 4 NOT DETECTED NOT DETECTED Final   Respiratory Syncytial Virus NOT DETECTED NOT DETECTED Final   Bordetella pertussis NOT DETECTED NOT DETECTED Final   Chlamydophila pneumoniae NOT DETECTED NOT DETECTED Final   Mycoplasma pneumoniae NOT DETECTED NOT DETECTED Final    Comment: Performed at [facility], [address] [place], [place] [address]    RADIOLOGY:  No results found.   Management plans discussed with the patient, family and they are in agreement.  CODE STATUS: DNR   TOTAL TIME TAKING CARE OF THIS PATIENT: 36 minutes.    Candida Vetter M.D on 06/14/2018 at 11:28 AM  Between 7am to 6pm - Pager - [phone]  After 6pm go to [email or web address] - Social research officer, government  [facility]  Office  [phone]  CC: Primary care physician; Marisue Ivan, MD   Note: This dictation was prepared with Dragon dictation along with smaller phrase technology. Any transcriptional errors that result from this process are unintentional.

## 2018-06-14 NOTE — Progress Notes (Signed)
Pulmonary Medicine          Date: 06/14/2018,   MRN# [id number] Gary York Apr 17, 1921     AdmissionWeight: 95.3 kg                 CurrentWeight: 92.9 kg       SUBJECTIVE   Patient in no acute distress, complains of belching without reflux esophagitis (ordering one time dose simethicone solution).  Discussed care plan with daugter [name].  Patient requests d/c home today. Cleared from pulmonary standpoint.  Would be glad to follow up on outpatient, will set up appt for clinic eval.    PAST MEDICAL HISTORY   Past Medical History:  Diagnosis Date  . CAD (coronary artery disease)   . Complete heart block (HCC)   . Hyperlipidemia   . Hypertension      SURGICAL HISTORY   Past Surgical History:  Procedure Laterality Date  . PACEMAKER IMPLANT       FAMILY HISTORY   Family History  Problem Relation Age of Onset  . Dementia Mother   . Diabetes Mother   . Dementia Father      SOCIAL HISTORY   Social History   Tobacco Use  . Smoking status: Former Smoker    Packs/day: 1.00    Years: 20.00    Pack years: 20.00    Types: Cigarettes    Last attempt to quit: 1990    Years since quitting: 30.2  . Smokeless tobacco: Never Used  Substance Use Topics  . Alcohol use: Not Currently  . Drug use: Never     MEDICATIONS    Home Medication:    Current Medication:  Current Facility-Administered Medications:  .  acetaminophen (TYLENOL) tablet 650 mg, 650 mg, Oral, Q6H PRN **OR** acetaminophen (TYLENOL) suppository 650 mg, 650 mg, Rectal, Q6H PRN, Wieting, Richard, MD .  acetylcysteine (MUCOMYST) 20 % nebulizer / oral solution 2 mL, 2 mL, Nebulization, BID, Enid Baas, Jude, MD, Stopped at 06/14/18 0733 .  albuterol (PROVENTIL) (2.5 MG/3ML) 0.083% nebulizer solution 2.5 mg, 2.5 mg, Nebulization, Q6H PRN, Alford Highland, MD, 2.5 mg at 06/14/18 0727 .  albuterol (PROVENTIL) (2.5 MG/3ML) 0.083% nebulizer solution 2.5 mg, 2.5 mg, Nebulization, BID, Ojie, Jude,  MD, 2.5 mg at 06/13/18 0727 .  aspirin EC tablet 81 mg, 81 mg, Oral, Daily, Alford Highland, MD, 81 mg at 06/13/18 0904 .  atorvastatin (LIPITOR) tablet 80 mg, 80 mg, Oral, q1800, Alford Highland, MD, 80 mg at 06/13/18 1815 .  budesonide (PULMICORT) nebulizer solution 0.5 mg, 0.5 mg, Nebulization, BID, Renae Gloss, Richard, MD, 0.5 mg at 06/14/18 0726 .  clopidogrel (PLAVIX) tablet 75 mg, 75 mg, Oral, Daily, Alford Highland, MD, 75 mg at 06/13/18 0904 .  diltiazem (CARDIZEM CD) 24 hr capsule 180 mg, 180 mg, Oral, Daily, Renae Gloss, Richard, MD, 180 mg at 06/13/18 0904 .  diphenhydrAMINE (BENADRYL) capsule 25 mg, 25 mg, Oral, QHS PRN, Oralia Manis, MD .  doxazosin (CARDURA) tablet 2 mg, 2 mg, Oral, Daily, Renae Gloss, Richard, MD, 2 mg at 06/13/18 0904 .  enoxaparin (LOVENOX) injection 40 mg, 40 mg, Subcutaneous, Q24H, Wieting, Richard, MD, 40 mg at 06/13/18 1133 .  furosemide (LASIX) injection 20 mg, 20 mg, Intravenous, BID, Renae Gloss, Richard, MD, 20 mg at 06/13/18 1815 .  guaiFENesin-dextromethorphan (ROBITUSSIN DM) 100-10 MG/5ML syrup 5 mL, 5 mL, Oral, Q4H PRN, Delfino Lovett, MD, 5 mL at 06/13/18 0440 .  isosorbide mononitrate (IMDUR) 24 hr tablet 60 mg, 60 mg, Oral, Daily, Wieting,  Richard, MD, 60 mg at 06/13/18 0904 .  losartan (COZAAR) tablet 100 mg, 100 mg, Oral, Daily, Renae Gloss, Richard, MD, 100 mg at 06/13/18 0903 .  magnesium hydroxide (MILK OF MAGNESIA) suspension 30 mL, 30 mL, Oral, QHS PRN, Wieting, Richard, MD .  metoprolol tartrate (LOPRESSOR) tablet 25 mg, 25 mg, Oral, BID, Renae Gloss, Richard, MD, 25 mg at 06/13/18 2104 .  nitroGLYCERIN (NITROSTAT) SL tablet 0.4 mg, 0.4 mg, Sublingual, Q5 min PRN, Wieting, Richard, MD .  ondansetron (ZOFRAN) tablet 4 mg, 4 mg, Oral, Q6H PRN **OR** ondansetron (ZOFRAN) injection 4 mg, 4 mg, Intravenous, Q6H PRN, Alford Highland, MD, 4 mg at 06/12/18 9604 .  pantoprazole (PROTONIX) EC tablet 40 mg, 40 mg, Oral, Daily, Renae Gloss, Richard, MD, 40 mg at 06/13/18 0904 .   polyethylene glycol (MIRALAX / GLYCOLAX) packet 17 g, 17 g, Oral, Daily PRN, Alford Highland, MD, 17 g at 06/13/18 0446 .  predniSONE (DELTASONE) tablet 50 mg, 50 mg, Oral, Q breakfast, Nickholas Goldston, MD, 50 mg at 06/13/18 0904 .  simethicone (MYLICON) 40 MG/0.6ML suspension 80 mg, 80 mg, Oral, Once, Vida Rigger, MD    ALLERGIES   Patient has no known allergies.     REVIEW OF SYSTEMS    Review of Systems:  Gen:  Denies  fever, sweats, chills weigh loss  HEENT: Denies blurred vision, double vision, ear pain, eye pain, hearing loss, nose bleeds, sore throat Cardiac:  No dizziness, chest pain or heaviness, chest tightness,edema Resp:   Denies cough or sputum porduction, shortness of breath,wheezing, hemoptysis,  Gi: Denies swallowing difficulty, stomach pain, nausea or vomiting, diarrhea, constipation, bowel incontinence Gu:  Denies bladder incontinence, burning urine Ext:   Denies Joint pain, stiffness or swelling Skin: Denies  skin rash, easy bruising or bleeding or hives Endoc:  Denies polyuria, polydipsia , polyphagia or weight change Psych:   Denies depression, insomnia or hallucinations   Other:  All other systems negative   VS: BP 123/61 (BP Location: Left Arm)   Pulse 70   Temp 36.9 C (Oral)   Resp 18   Ht  (1.753 m)   Wt 92.9 kg   SpO2 95%   BMI 30.26 kg/m      PHYSICAL EXAM    GENERAL:NAD, no fevers, chills, no weakness no fatigue HEAD: Normocephalic, atraumatic.  EYES: Pupils equal, round, reactive to light. Extraocular muscles intact. No scleral icterus.  MOUTH: Moist mucosal membrane. Dentition intact. No abscess noted.  EAR, NOSE, THROAT: Clear without exudates. No external lesions.  NECK: Supple. No thyromegaly. No nodules. No JVD.  PULMONARY: Diffuse coarse rhonchi right sided +wheezes CARDIOVASCULAR: S1 and S2. Regular rate and rhythm. No murmurs, rubs, or gallops. No edema. Pedal pulses 2+ bilaterally.  GASTROINTESTINAL: Soft,  nontender, nondistended. No masses. Positive bowel sounds. No hepatosplenomegaly.  MUSCULOSKELETAL: No swelling, clubbing, or edema. Range of motion full in all extremities.  NEUROLOGIC: Cranial nerves II through XII are intact. No gross focal neurological deficits. Sensation intact. Reflexes intact.  SKIN: No ulceration, lesions, rashes, or cyanosis. Skin warm and dry. Turgor intact.  PSYCHIATRIC: Mood, affect within normal limits. The patient is awake, alert and oriented x 3. Insight, judgment intact.       IMAGING    Dg Chest Portable 1 View  Result Date: 06/11/2018 CLINICAL DATA:  Chest pain and possible myocardial infarction. EXAM: PORTABLE CHEST 1 VIEW COMPARISON:  09/16/2017 FINDINGS: Stable heart size and appearance of biventricular pacer. Stable elevation of the right hemidiaphragm with volume loss of  the right lung. Stable chronic lung disease. There may be mild pulmonary interstitial edema. No overt airspace edema or pleural effusions. No pneumothorax. IMPRESSION: Possible mild pulmonary interstitial edema. Stable chronic lung disease, elevated right hemidiaphragm and right lung volume loss. Electronically Signed   By: Irish Lack M.D.   On: 06/11/2018 09:08      ASSESSMENT/PLAN    Cough, wheezing, fatigue and dyspnea with minimal exertion -Moderate Acute COPD exacerbation with acute systolic CHF exacerbation      -still has bilateral wheezing but improved from previous      - can d/c with short prednisone taper and z pack and we will follow up in pulmonary clinic.   -Mild acute systolic CHF exacerbation-cardio on case appreciate input    -Made appointment for patient to be closely followed up post hospital discharge at [facility] pulmonology with Dr. Meredeth Ide  Thank you for consultation.  This document was prepared using Dragon voice recognition software and may include unintentional dictation errors.      Thank you for allowing me to participate in the care of this  patient.    This document was prepared using Dragon voice recognition software and may include unintentional dictation errors.     Vida Rigger, M.D.  Division of Pulmonary & Critical Care Medicine  [facility]

## 2018-06-14 NOTE — TOC Transition Note (Signed)
Transition of Care [facility]) - CM/SW Discharge Note   Patient Details  Name: Gary York MRN: [id number] Date of Birth: 09/10/1921  Transition of Care [facility] CM/SW Contact:  Virgel Manifold, RN Phone Number: 06/14/2018, 11:50 AM   Clinical Narrative:   Patient to be discharged per MD order. Orders in place for home health services. Previous RNCM had began workup for CHF protocol via [facility]. Notified Rosey Bath of pending discharge, she has already received signed protocol. Patient from [facility], has caregivers in home. Family to transport.      Final next level of care: Home w Home Health Services Barriers to Discharge: Continued Medical Work up   Patient Goals and CMS Choice Patient states their goals for this hospitalization and ongoing recovery are:: Discharge to home with home health nurse CMS Medicare.gov Compare Post Acute Care list provided to:: Patient Choice offered to / list presented to : Patient  Discharge Placement                       Discharge Plan and Services   Discharge Planning Services: CM Consult, HF Clinic Post Acute Care Choice: Home Health              HH Arranged: RN(ReDS Vest) HH Agency: [facility] (now [facility])   Social Determinants of Health (SDOH) Interventions     Readmission Risk Interventions No flowsheet data found.

## 2018-06-15 ENCOUNTER — Encounter: Payer: Self-pay | Admitting: Emergency Medicine

## 2018-06-15 ENCOUNTER — Emergency Department: Payer: Medicare Other

## 2018-06-15 ENCOUNTER — Emergency Department
Admission: EM | Admit: 2018-06-15 | Discharge: 2018-06-15 | Disposition: A | Discharge status: Home / Self Care | Payer: Medicare Other | Attending: Emergency Medicine | Admitting: Emergency Medicine

## 2018-06-15 ENCOUNTER — Other Ambulatory Visit: Payer: Self-pay

## 2018-06-15 DIAGNOSIS — I2510 Atherosclerotic heart disease of native coronary artery without angina pectoris: Secondary | ICD-10-CM | POA: Insufficient documentation

## 2018-06-15 DIAGNOSIS — Z87891 Personal history of nicotine dependence: Secondary | ICD-10-CM | POA: Insufficient documentation

## 2018-06-15 DIAGNOSIS — I509 Heart failure, unspecified: Secondary | ICD-10-CM | POA: Insufficient documentation

## 2018-06-15 DIAGNOSIS — R0600 Dyspnea, unspecified: Secondary | ICD-10-CM | POA: Diagnosis not present

## 2018-06-15 DIAGNOSIS — I110 Hypertensive heart disease with heart failure: Secondary | ICD-10-CM | POA: Insufficient documentation

## 2018-06-15 DIAGNOSIS — R0602 Shortness of breath: Secondary | ICD-10-CM | POA: Diagnosis present

## 2018-06-15 DIAGNOSIS — Z950 Presence of cardiac pacemaker: Secondary | ICD-10-CM | POA: Diagnosis not present

## 2018-06-15 DIAGNOSIS — Z7982 Long term (current) use of aspirin: Secondary | ICD-10-CM | POA: Insufficient documentation

## 2018-06-15 DIAGNOSIS — Z79899 Other long term (current) drug therapy: Secondary | ICD-10-CM | POA: Diagnosis not present

## 2018-06-15 LAB — COMPREHENSIVE METABOLIC PANEL
ALT: 28 U/L (ref 0–44)
AST: 31 U/L (ref 15–41)
Albumin: 4.1 g/dL (ref 3.5–5.0)
Alkaline Phosphatase: 52 U/L (ref 38–126)
Anion gap: 8 (ref 5–15)
BUN: 17 mg/dL (ref 8–23)
CO2: 28 mmol/L (ref 22–32)
Calcium: 8.5 mg/dL — ABNORMAL LOW (ref 8.9–10.3)
Chloride: 93 mmol/L — ABNORMAL LOW (ref 98–111)
Creatinine, Ser: 0.91 mg/dL (ref 0.61–1.24)
GFR calc Af Amer: >60 mL/min (ref >60)
GFR calc non Af Amer: >60 mL/min (ref >60)
GLUCOSE: 150 mg/dL — AB (ref 70–99)
Potassium: 3.7 mmol/L (ref 3.5–5.1)
Sodium: 129 mmol/L — ABNORMAL LOW (ref 135–145)
Total Bilirubin: 0.5 mg/dL (ref 0.3–1.2)
Total Protein: 6.5 g/dL (ref 6.5–8.1)

## 2018-06-15 LAB — CBC
HCT: 30.8 % — ABNORMAL LOW (ref 39.0–52.0)
HEMOGLOBIN: 10.7 g/dL — AB (ref 13.0–17.0)
MCH: 34 pg (ref 26.0–34.0)
MCHC: 34.7 g/dL (ref 30.0–36.0)
MCV: 97.8 fL (ref 80.0–100.0)
Platelets: 174 10*3/uL (ref 150–400)
RBC: 3.15 MIL/uL — ABNORMAL LOW (ref 4.22–5.81)
RDW: 13.3 % (ref 11.5–15.5)
WBC: 12.3 10*3/uL — ABNORMAL HIGH (ref 4.0–10.5)
nRBC: 0 % (ref 0.0–0.2)

## 2018-06-15 LAB — TROPONIN I: Troponin I: <0.03 ng/mL (ref <0.03)

## 2018-06-15 LAB — BRAIN NATRIURETIC PEPTIDE: B Natriuretic Peptide: 333 pg/mL — ABNORMAL HIGH (ref 0.0–100.0)

## 2018-06-15 NOTE — ED Notes (Signed)
Spoke with daughter who states that pt has some anxiety issues and that has continued to have a cough throughout his hospital stay and since returning home.  States that he has been doing well since returning home and that she also has noted that he has some seasonal allergies contributing to his cough.  She states that an O2 sat of 95% is good for him and he continues to maintain same on RA at this time.  Will continue to monitor and will keep daughter updated.

## 2018-06-15 NOTE — ED Triage Notes (Signed)
Pt to ER via EMS from home with c/o [facility].  Per EMS when they arrived pt was lying flat in bed.  Pt was just released from here yesterday with "fluid".  Pt states he is feeling some better at this time.

## 2018-06-15 NOTE — ED Provider Notes (Signed)
[facility] Emergency Department Provider Note  Time seen: 3:12 PM  I have reviewed the triage vital signs and the nursing notes.   HISTORY  Chief Complaint Shortness of Breath   HPI Gary York is a 85 y.o. male with a past medical history of CAD, hypertension, hyperlipidemia, CHF, presents to the emergency department with shortness of breath.  Per record review patient was discharged from the hospital yesterday after admission thought to be due to CHF exacerbation.  At that time patient had a respiratory viral panel as well as influenza test come back negative.  No known fever.  Afebrile throughout his hospital stay.  Patient states he is feeling better however when asked why he came he says because he feels "like shit" but cannot describe what he is feeling.  When asked if he is feeling short of breath he states sometimes.  Denies any chest pain.  Patient is overall afebrile with reassuring vitals, appears nontoxic.   Past Medical History:  Diagnosis Date  . CAD (coronary artery disease)   . Complete heart block (HCC)   . Hyperlipidemia   . Hypertension     Patient Active Problem List   Diagnosis Date Noted  . CHF (congestive heart failure) (HCC) 09/16/2017  . Chest pain 09/09/2017    Past Surgical History:  Procedure Laterality Date  . PACEMAKER IMPLANT      Prior to Admission medications   Medication Sig Start Date End Date Taking? Authorizing Provider  acetaminophen (TYLENOL) 500 MG tablet Take 500 mg by mouth every 6 (six) hours as needed for fever.    [provider]  albuterol (PROVENTIL HFA;VENTOLIN HFA) 108 (90 Base) MCG/ACT inhaler Inhale 2 puffs into the lungs every 6 (six) hours as needed for wheezing or shortness of breath. 09/12/17   Enid Baas, MD  albuterol (PROVENTIL) (2.5 MG/3ML) 0.083% nebulizer solution Take 3 mLs (2.5 mg total) by nebulization every 6 (six) hours as needed for wheezing or shortness of breath. Dx:  copd exacerbation J44.1 09/18/17   Alford Highland, MD  aspirin EC 81 MG EC tablet Take 1 tablet (81 mg total) by mouth daily. 09/13/17   Enid Baas, MD  atorvastatin (LIPITOR) 80 MG tablet Take 1 tablet (80 mg total) by mouth daily at 6 PM. 09/12/17   Enid Baas, MD  azithromycin (ZITHROMAX) 500 MG tablet Take 1 tablet (500 mg total) by mouth daily for 5 days. Take 1 tablet daily for 3 days. 06/14/18 06/19/18  Jama Flavors, MD  clopidogrel (PLAVIX) 75 MG tablet Take 1 tablet (75 mg total) by mouth daily. 09/13/17   Enid Baas, MD  diltiazem (DILTIAZEM CD) 180 MG 24 hr capsule Take 180 mg by mouth daily.    [provider]  doxazosin (CARDURA) 2 MG tablet Take 2 mg by mouth daily. 08/19/17   [provider]  Fluticasone-Salmeterol (ADVAIR DISKUS) 250-50 MCG/DOSE AEPB Inhale 1 puff into the lungs 2 (two) times daily. 09/12/17 09/12/18  Enid Baas, MD  furosemide (LASIX) 20 MG tablet Take 1 tablet (20 mg total) by mouth 2 (two) times daily. 06/14/18 06/14/19  Enid Baas Jude, MD  guaiFENesin-dextromethorphan (ROBITUSSIN DM) 100-10 MG/5ML syrup Take 5 mLs by mouth every 4 (four) hours as needed for up to 5 days for cough. 06/14/18 06/19/18  Jama Flavors, MD  isosorbide mononitrate (IMDUR) 60 MG 24 hr tablet Take 1 tablet (60 mg total) by mouth daily. 09/19/17   Alford Highland, MD  levalbuterol Pauline Aus) 1.25 MG/0.5ML nebulizer solution  Take 1.25 mg by nebulization every 6 (six) hours as needed for wheezing or shortness of breath. Copd exacerbation V44.1 09/18/17 09/18/18  Alford Highland, MD  losartan (COZAAR) 100 MG tablet Take 100 mg by mouth daily. 11/20/16   [provider]  magnesium hydroxide (MILK OF MAGNESIA) 400 MG/5ML suspension Take 30 mLs by mouth at bedtime as needed for mild constipation.    [provider]  metoprolol succinate (TOPROL XL) 25 MG 24 hr tablet Take 1 tablet (25 mg total) by mouth daily. 06/14/18 06/14/19  Jama Flavors, MD   nitroGLYCERIN (NITROSTAT) 0.4 MG SL tablet Place 1 tablet (0.4 mg total) under the tongue every 5 (five) minutes as needed for chest pain. 09/12/17 06/11/18  Enid Baas, MD  pantoprazole (PROTONIX) 40 MG tablet Take 1 tablet (40 mg total) by mouth daily. 09/12/17   Enid Baas, MD  polyethylene glycol (MIRALAX / GLYCOLAX) packet Take 17 g by mouth daily as needed. 09/18/17   Alford Highland, MD  predniSONE (DELTASONE) 10 MG tablet Prednisone 40 mg p.o. daily x2 days Then 30 mg p.o. daily x2 days Then 20 mg p.o. daily x2 days Then 10 mg p.o. daily x2 days 06/14/18   Jama Flavors, MD    No Known Allergies  Family History  Problem Relation Age of Onset  . Dementia Mother   . Diabetes Mother   . Dementia Father     Social History Social History   Tobacco Use  . Smoking status: Former Smoker    Packs/day: 1.00    Years: 20.00    Pack years: 20.00    Types: Cigarettes    Last attempt to quit: 1990    Years since quitting: 30.2  . Smokeless tobacco: Never Used  Substance Use Topics  . Alcohol use: Not Currently  . Drug use: Never    Review of Systems Constitutional: Negative for fever. Cardiovascular: Negative for chest pain. Respiratory: Intermittent shortness of breath Gastrointestinal: Negative for abdominal pain, vomiting Musculoskeletal: No known swelling of his legs. Skin: Negative for skin complaints  Neurological: Negative for headache All other ROS negative  ____________________________________________   PHYSICAL EXAM:  VITAL SIGNS: ED Triage Vitals  Enc Vitals Group     BP 06/15/18 1445 131/69     Pulse Rate 06/15/18 1445 70     Resp 06/15/18 1445 20     Temp 06/15/18 1445 97.6 F (36.4 C)     Temp src --      SpO2 06/15/18 1441 95 %     Weight 06/15/18 1446 204 lb 14.4 oz (92.9 kg)     Height 06/15/18 1446 5\' 9"  (1.753 m)     Head Circumference --      Peak Flow --      Pain Score 06/15/18 1446 0     Pain Loc --      Pain Edu? --       Excl. in GC? --    Constitutional: Alert and oriented. Well appearing and in no distress. Eyes: Normal exam ENT   Head: Normocephalic and atraumatic.   Mouth/Throat: Mucous membranes are moist. Cardiovascular: Normal rate, regular rhythm. No murmur Respiratory: Normal respiratory effort without tachypnea nor retractions. Breath sounds are clear Gastrointestinal: Soft and nontender. No distention.   Musculoskeletal: Nontender with normal range of motion in all extremities. No lower extremity tenderness or edema. Neurologic:  Normal speech and language. No gross focal neurologic deficits Skin:  Skin is warm, dry and intact.  Psychiatric: Mood and  affect are normal.   ____________________________________________    EKG  EKG viewed and interpreted by myself shows a ventricular paced rhythm at 75 bpm with a widened QRS, left axis deviation nonspecific ST changes.  ____________________________________________    RADIOLOGY  IMPRESSION: Minimal right basilar subsegmental atelectasis.  Aortic Atherosclerosis (ICD10-I70.0).  ____________________________________________   INITIAL IMPRESSION / ASSESSMENT AND PLAN / ED COURSE  Pertinent labs & imaging results that were available during my care of the patient were reviewed by me and considered in my medical decision making (see chart for details).  Patient presents to the emergency department for not feeling well, which best I can decipher is intermittent shortness of breath.  We will check labs, chest x-ray, cardiac enzymes and continue to closely monitor.  Patient has clear lung sounds currently without wheeze.  Satting between 95 and 98% on room air.  No lower extremity edema.  Differential would contain metabolic abnormality such as renal insufficiency, anemia, CHF, ACS.  Unlikely COPD given clear lung sounds satting 96 to 98% on room air.  We will continue to closely monitor while awaiting results.  Patient's lab results are  largely unchanged from discharge.  Mildly hyponatremic which appears chronic.  Mildly leukocytosis, however patient is currently on a prednisone taper per discharge summary.  Troponin remains negative.  Chest x-ray is reassuring.  Vitals remain reassuring including a 97% room air saturation currently with otherwise normal vitals.  Overall the patient appears very well.  We will discharge with PCP follow-up.  ____________________________________________   FINAL CLINICAL IMPRESSION(S) / ED DIAGNOSES  Dyspnea   Minna Antis, MD 06/15/18 [phone]

## 2018-06-15 NOTE — ED Notes (Signed)
Pts call bell answered. Pt assisted to use urinal by this tech and Victorino Dike, Charity fundraiser. Pt noted to be wheezing after moving to get in place to use urinal.

## 2018-06-15 NOTE — ED Notes (Signed)
Pt unable to sing discharge due to dementia.  Discharge discussed with daughter via phone and when pt taken to car.

## 2019-01-21 ENCOUNTER — Other Ambulatory Visit: Payer: Self-pay

## 2019-01-21 ENCOUNTER — Ambulatory Visit: Payer: Medicare Other | Admitting: Podiatry

## 2019-01-21 ENCOUNTER — Encounter: Payer: Self-pay | Admitting: Podiatry

## 2019-01-21 DIAGNOSIS — B351 Tinea unguium: Secondary | ICD-10-CM | POA: Diagnosis not present

## 2019-01-21 DIAGNOSIS — M79675 Pain in left toe(s): Secondary | ICD-10-CM | POA: Diagnosis not present

## 2019-01-21 DIAGNOSIS — M79674 Pain in right toe(s): Secondary | ICD-10-CM | POA: Diagnosis not present

## 2019-01-21 NOTE — Progress Notes (Signed)
Complaint:  Visit Type: Patient returns to my office for continued preventative foot care services. Complaint: Patient states" my nails have grown long and thick and become painful to walk and wear shoes".  Patient presents to the office with his daughter.   The patient presents for preventative foot care services.  Podiatric Exam: Vascular: dorsalis pedis and posterior tibial pulses are palpable bilateral. Capillary return is immediate. Temperature gradient is WNL. Skin turgor WNL  Sensorium: Normal Semmes Weinstein monofilament test. Normal tactile sensation bilaterally. Nail Exam: Pt has thick disfigured discolored nails with subungual debris noted bilateral entire nail hallux through fifth toenails.  Second toenail right foot has self-avulsed previously. Ulcer Exam: There is no evidence of ulcer or pre-ulcerative changes or infection. Orthopedic Exam: Muscle tone and strength are WNL. No limitations in general ROM. No crepitus or effusions noted. Foot type and digits show no abnormalities. Bony prominences are unremarkable. Skin: No Porokeratosis. No infection or ulcers  Diagnosis:  Onychomycosis, , Pain in right toe, pain in left toes  Treatment & Plan Procedures and Treatment: Consent by patient was obtained for treatment procedures.   Debridement of mycotic and hypertrophic toenails, 1 through 5 bilateral and clearing of subungual debris. No ulceration, no infection noted.  Return Visit-Office Procedure: Patient instructed to return to the office for a follow up visit 3 months for continued evaluation and treatment.    Gardiner Barefoot DPM

## 2019-03-05 IMAGING — CR DG CHEST 2V
2 series · 2 of 2 positions shown · non-contrast
Comparison: CT scan of the chest September 11, 2017 and PA and
lateral chest x-ray September 09, 2017.

CLINICAL DATA: Recent MI. Now with chest pain and shortness of
breath with improvement in symptoms after nitroglycerin and aspirin.
Former smoker.

EXAM:
CHEST - 2 VIEW

[chest pa]
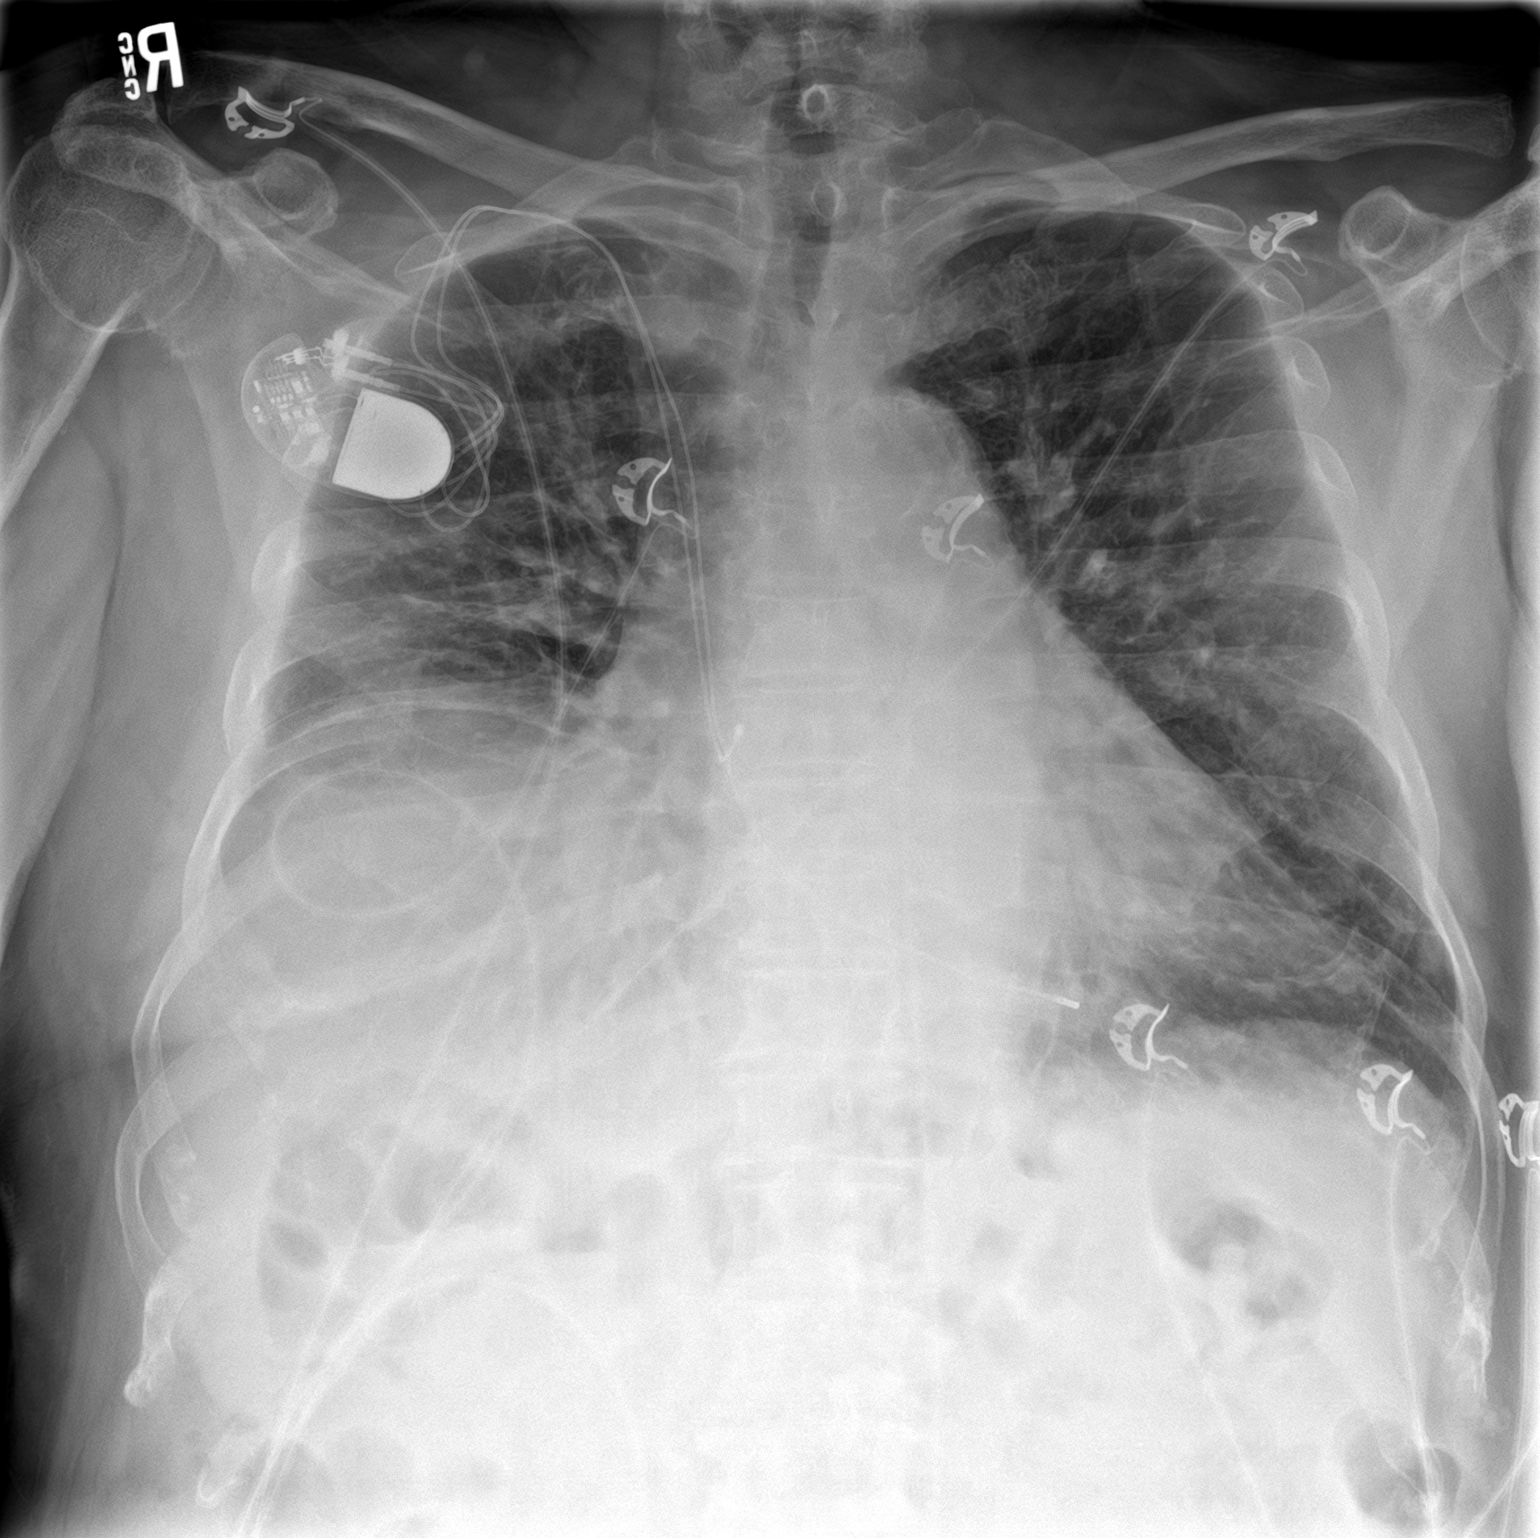

[chest lat]
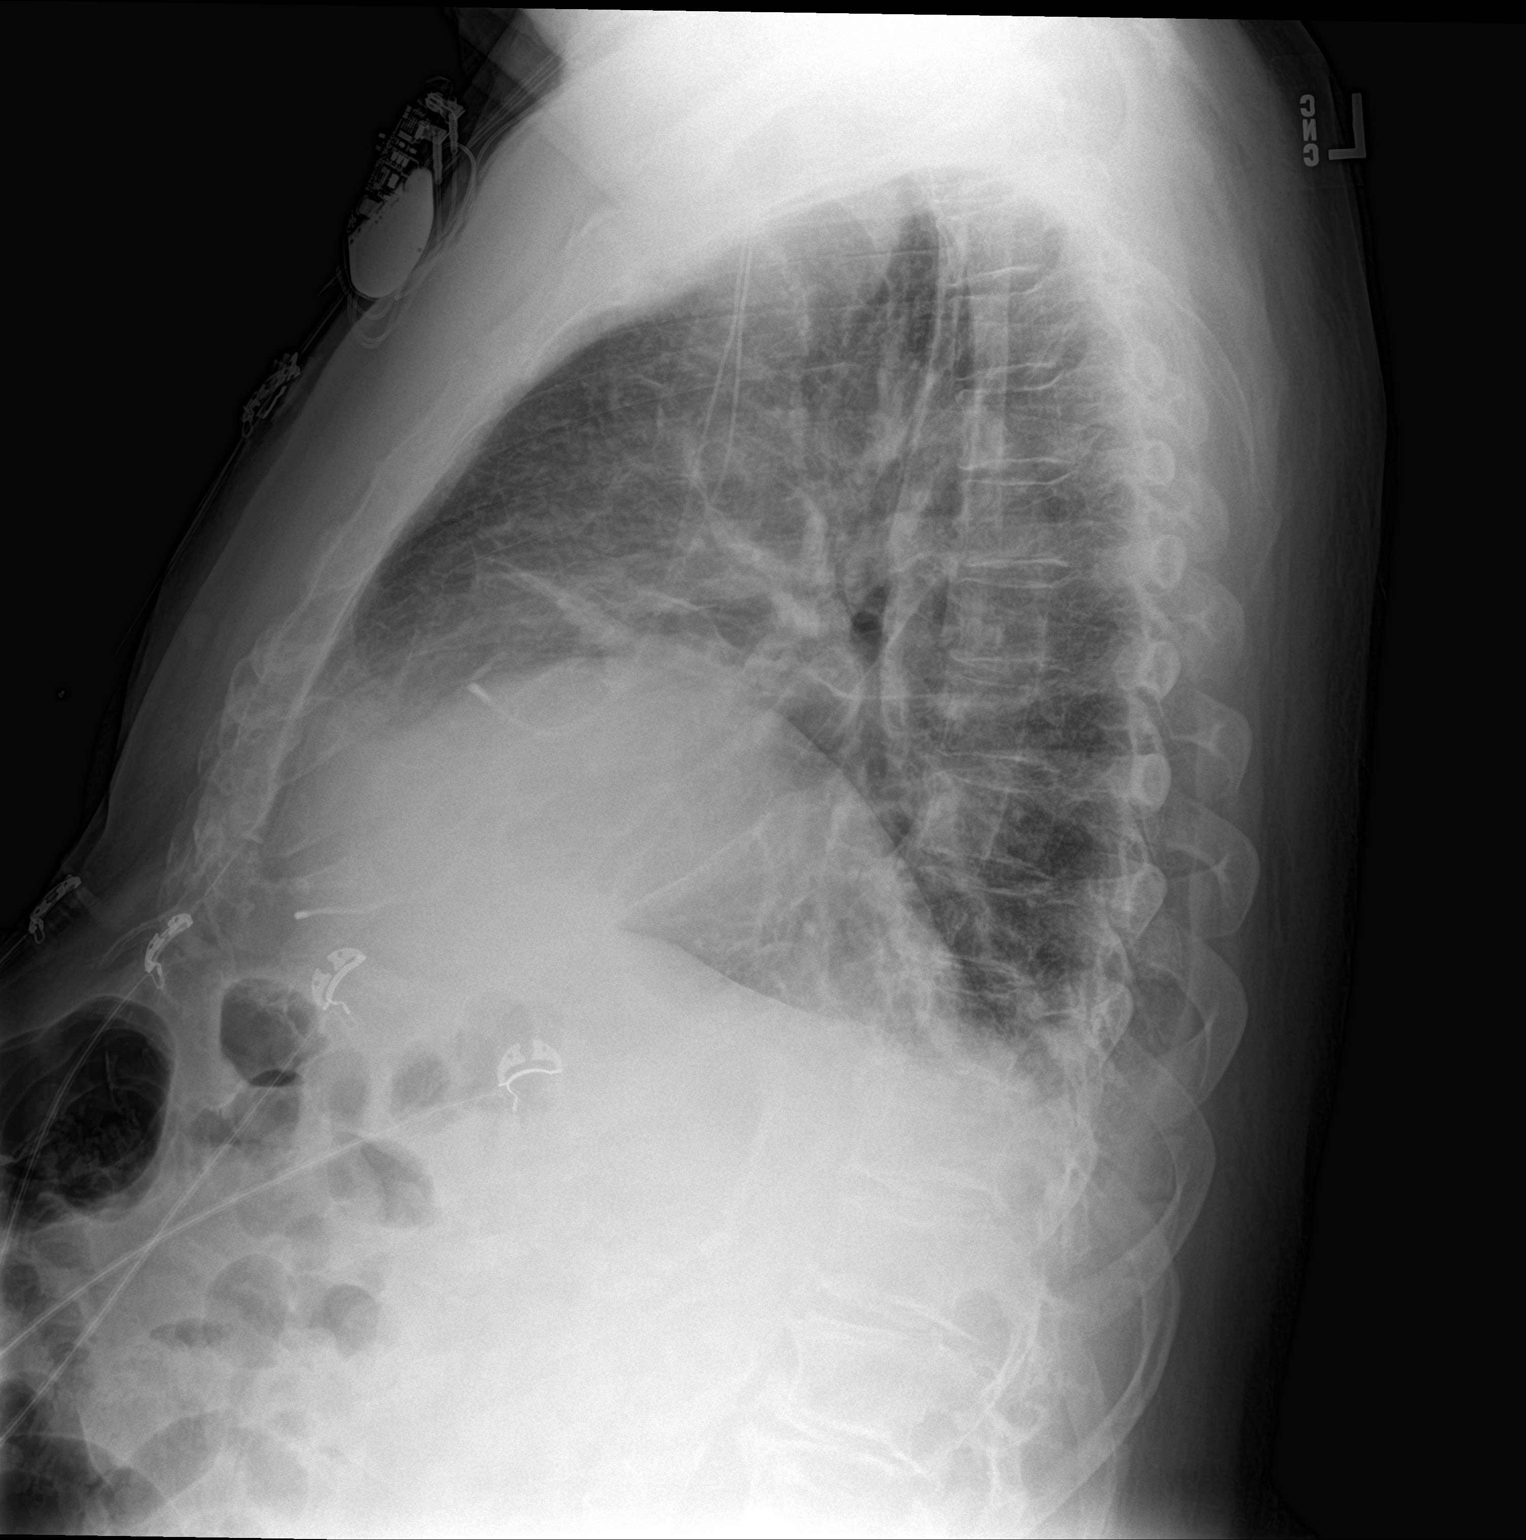

[2 of 2 positions shown; findings below may reference images not displayed]

FINDINGS: There remains elevation of the right hemidiaphragm. The interstitial
markings are both lungs are increased. The pulmonary vascularity is
engorged. The cardiac silhouette is mildly enlarged. The ICD is in
stable position. The mediastinum is normal in width. There is
calcification in the wall of the thoracic aorta. There small
bilateral pleural effusions layering posteriorly.
IMPRESSION: CHF superimposed upon COPD. Chronic elevation of the right
hemidiaphragm. No acute pneumonia. The appearance of the lungs has
deteriorated since the previous study.

## 2020-02-18 ENCOUNTER — Other Ambulatory Visit: Payer: Self-pay

## 2020-02-18 ENCOUNTER — Emergency Department: Payer: Medicare Other

## 2020-02-18 ENCOUNTER — Inpatient Hospital Stay
Admission: EM | Admit: 2020-02-18 | Discharge: 2020-02-21 | DRG: 193 | Disposition: A | Discharge status: Home Health | Payer: Medicare Other | Attending: Internal Medicine | Admitting: Internal Medicine

## 2020-02-18 DIAGNOSIS — D649 Anemia, unspecified: Secondary | ICD-10-CM | POA: Diagnosis present

## 2020-02-18 DIAGNOSIS — J440 Chronic obstructive pulmonary disease with acute lower respiratory infection: Secondary | ICD-10-CM | POA: Diagnosis present

## 2020-02-18 DIAGNOSIS — E785 Hyperlipidemia, unspecified: Secondary | ICD-10-CM | POA: Diagnosis present

## 2020-02-18 DIAGNOSIS — J189 Pneumonia, unspecified organism: Principal | ICD-10-CM | POA: Diagnosis present

## 2020-02-18 DIAGNOSIS — Z950 Presence of cardiac pacemaker: Secondary | ICD-10-CM

## 2020-02-18 DIAGNOSIS — Z87891 Personal history of nicotine dependence: Secondary | ICD-10-CM

## 2020-02-18 DIAGNOSIS — Z66 Do not resuscitate: Secondary | ICD-10-CM | POA: Diagnosis present

## 2020-02-18 DIAGNOSIS — I5022 Chronic systolic (congestive) heart failure: Secondary | ICD-10-CM | POA: Diagnosis present

## 2020-02-18 DIAGNOSIS — E861 Hypovolemia: Secondary | ICD-10-CM | POA: Diagnosis present

## 2020-02-18 DIAGNOSIS — G9341 Metabolic encephalopathy: Secondary | ICD-10-CM | POA: Diagnosis present

## 2020-02-18 DIAGNOSIS — Z20822 Contact with and (suspected) exposure to covid-19: Secondary | ICD-10-CM | POA: Diagnosis present

## 2020-02-18 DIAGNOSIS — R7401 Elevation of levels of liver transaminase levels: Secondary | ICD-10-CM | POA: Diagnosis present

## 2020-02-18 DIAGNOSIS — Z7951 Long term (current) use of inhaled steroids: Secondary | ICD-10-CM

## 2020-02-18 DIAGNOSIS — I442 Atrioventricular block, complete: Secondary | ICD-10-CM | POA: Diagnosis present

## 2020-02-18 DIAGNOSIS — I2510 Atherosclerotic heart disease of native coronary artery without angina pectoris: Secondary | ICD-10-CM | POA: Diagnosis present

## 2020-02-18 DIAGNOSIS — I350 Nonrheumatic aortic (valve) stenosis: Secondary | ICD-10-CM | POA: Diagnosis present

## 2020-02-18 DIAGNOSIS — R739 Hyperglycemia, unspecified: Secondary | ICD-10-CM | POA: Diagnosis present

## 2020-02-18 DIAGNOSIS — Z79899 Other long term (current) drug therapy: Secondary | ICD-10-CM

## 2020-02-18 DIAGNOSIS — Z7902 Long term (current) use of antithrombotics/antiplatelets: Secondary | ICD-10-CM

## 2020-02-18 DIAGNOSIS — J441 Chronic obstructive pulmonary disease with (acute) exacerbation: Secondary | ICD-10-CM | POA: Diagnosis not present

## 2020-02-18 DIAGNOSIS — I110 Hypertensive heart disease with heart failure: Secondary | ICD-10-CM | POA: Diagnosis present

## 2020-02-18 DIAGNOSIS — E871 Hypo-osmolality and hyponatremia: Secondary | ICD-10-CM | POA: Diagnosis present

## 2020-02-18 DIAGNOSIS — Z833 Family history of diabetes mellitus: Secondary | ICD-10-CM

## 2020-02-18 DIAGNOSIS — J9601 Acute respiratory failure with hypoxia: Secondary | ICD-10-CM | POA: Diagnosis present

## 2020-02-18 DIAGNOSIS — Z7982 Long term (current) use of aspirin: Secondary | ICD-10-CM

## 2020-02-18 DIAGNOSIS — T380X5A Adverse effect of glucocorticoids and synthetic analogues, initial encounter: Secondary | ICD-10-CM | POA: Diagnosis present

## 2020-02-18 HISTORY — DX: Chronic obstructive pulmonary disease, unspecified: J44.9

## 2020-02-18 HISTORY — DX: Chronic systolic (congestive) heart failure: I50.22

## 2020-02-18 LAB — CBC
HCT: 34.7 % — ABNORMAL LOW (ref 39.0–52.0)
Hemoglobin: 11.9 g/dL — ABNORMAL LOW (ref 13.0–17.0)
MCH: 32.8 pg (ref 26.0–34.0)
MCHC: 34.3 g/dL (ref 30.0–36.0)
MCV: 95.6 fL (ref 80.0–100.0)
Platelets: 196 10*3/uL (ref 150–400)
RBC: 3.63 MIL/uL — ABNORMAL LOW (ref 4.22–5.81)
RDW: 14.4 % (ref 11.5–15.5)
WBC: 8.9 10*3/uL (ref 4.0–10.5)
nRBC: 0 % (ref 0.0–0.2)

## 2020-02-18 LAB — BASIC METABOLIC PANEL
Anion gap: 9 (ref 5–15)
BUN: 15 mg/dL (ref 8–23)
CO2: 24 mmol/L (ref 22–32)
Calcium: 8.8 mg/dL — ABNORMAL LOW (ref 8.9–10.3)
Chloride: 90 mmol/L — ABNORMAL LOW (ref 98–111)
Creatinine, Ser: 1.05 mg/dL (ref 0.61–1.24)
GFR, Estimated: >60 mL/min (ref >60)
Glucose, Bld: 146 mg/dL — ABNORMAL HIGH (ref 70–99)
Potassium: 3.9 mmol/L (ref 3.5–5.1)
Sodium: 123 mmol/L — ABNORMAL LOW (ref 135–145)

## 2020-02-18 LAB — GLUCOSE, CAPILLARY: Glucose-Capillary: 149 mg/dL — ABNORMAL HIGH (ref 70–99)

## 2020-02-18 LAB — RESP PANEL BY RT-PCR (FLU A&B, COVID) ARPGX2
Influenza A by PCR: NEGATIVE
Influenza B by PCR: NEGATIVE
SARS Coronavirus 2 by RT PCR: NEGATIVE

## 2020-02-18 LAB — PROCALCITONIN: Procalcitonin: <0.1 ng/mL

## 2020-02-18 LAB — HEPATIC FUNCTION PANEL
ALT: 26 U/L (ref 0–44)
AST: 35 U/L (ref 15–41)
Albumin: 4.2 g/dL (ref 3.5–5.0)
Alkaline Phosphatase: 89 U/L (ref 38–126)
Bilirubin, Direct: 0.2 mg/dL (ref 0.0–0.2)
Indirect Bilirubin: 1 mg/dL — ABNORMAL HIGH (ref 0.3–0.9)
Total Bilirubin: 1.2 mg/dL (ref 0.3–1.2)
Total Protein: 6.8 g/dL (ref 6.5–8.1)

## 2020-02-18 LAB — STREP PNEUMONIAE URINARY ANTIGEN: Strep Pneumo Urinary Antigen: NEGATIVE

## 2020-02-18 LAB — TROPONIN I (HIGH SENSITIVITY)
Troponin I (High Sensitivity): 30 ng/L — ABNORMAL HIGH (ref <18)
Troponin I (High Sensitivity): 32 ng/L — ABNORMAL HIGH (ref <18)

## 2020-02-18 MED ORDER — ISOSORBIDE MONONITRATE ER 30 MG PO TB24
30.0000 mg | ORAL_TABLET | Freq: Every day | ORAL | Status: DC
  Administered 2020-02-19 – 2020-02-21 (×3): 30 mg via ORAL
  Filled 2020-02-18 (×3): qty 1

## 2020-02-18 MED ORDER — IPRATROPIUM-ALBUTEROL 0.5-2.5 (3) MG/3ML IN SOLN
3.0000 mL | Freq: Once | RESPIRATORY_TRACT | Status: AC
  Administered 2020-02-18: 3 mL via RESPIRATORY_TRACT

## 2020-02-18 MED ORDER — METOPROLOL SUCCINATE ER 25 MG PO TB24
25.0000 mg | ORAL_TABLET | Freq: Every day | ORAL | Status: DC
  Administered 2020-02-19 – 2020-02-21 (×3): 25 mg via ORAL
  Filled 2020-02-18 (×3): qty 1

## 2020-02-18 MED ORDER — PREDNISONE 20 MG PO TABS: 40.0000 mg | ORAL_TABLET | Freq: Every day | ORAL | Status: DC

## 2020-02-18 MED ORDER — ASPIRIN EC 81 MG PO TBEC
81.0000 mg | DELAYED_RELEASE_TABLET | Freq: Every day | ORAL | Status: DC
  Administered 2020-02-19 – 2020-02-21 (×3): 81 mg via ORAL
  Filled 2020-02-18 (×3): qty 1

## 2020-02-18 MED ORDER — SODIUM CHLORIDE 0.9 % IV SOLN
1.0000 g | INTRAVENOUS | Status: DC
  Administered 2020-02-19 – 2020-02-20 (×2): 1 g via INTRAVENOUS
  Filled 2020-02-18: qty 10
  Filled 2020-02-18 (×2): qty 1

## 2020-02-18 MED ORDER — ACETAMINOPHEN 325 MG PO TABS: 650.0000 mg | ORAL_TABLET | Freq: Four times a day (QID) | ORAL | Status: DC | PRN

## 2020-02-18 MED ORDER — ACETAMINOPHEN 650 MG RE SUPP: 650.0000 mg | Freq: Four times a day (QID) | RECTAL | Status: DC | PRN

## 2020-02-18 MED ORDER — METHYLPREDNISOLONE SODIUM SUCC 40 MG IJ SOLR: 40.0000 mg | Freq: Two times a day (BID) | INTRAMUSCULAR | Status: DC

## 2020-02-18 MED ORDER — IPRATROPIUM-ALBUTEROL 0.5-2.5 (3) MG/3ML IN SOLN
3.0000 mL | Freq: Once | RESPIRATORY_TRACT | Status: AC
  Administered 2020-02-18: 3 mL via RESPIRATORY_TRACT
  Filled 2020-02-18: qty 9

## 2020-02-18 MED ORDER — METOPROLOL TARTRATE 5 MG/5ML IV SOLN
5.0000 mg | Freq: Once | INTRAVENOUS | Status: AC
  Administered 2020-02-19: 5 mg via INTRAVENOUS
  Filled 2020-02-18: qty 5

## 2020-02-18 MED ORDER — LOSARTAN POTASSIUM 50 MG PO TABS
100.0000 mg | ORAL_TABLET | Freq: Every day | ORAL | Status: DC
  Administered 2020-02-19 – 2020-02-21 (×3): 100 mg via ORAL
  Filled 2020-02-18 (×3): qty 2

## 2020-02-18 MED ORDER — POLYETHYLENE GLYCOL 3350 17 G PO PACK: 17.0000 g | PACK | Freq: Every day | ORAL | Status: DC | PRN

## 2020-02-18 MED ORDER — SODIUM CHLORIDE 0.9 % IV BOLUS
1000.0000 mL | Freq: Once | INTRAVENOUS | Status: AC
  Administered 2020-02-18: 1000 mL via INTRAVENOUS

## 2020-02-18 MED ORDER — IPRATROPIUM BROMIDE 0.02 % IN SOLN: 0.5000 mg | Freq: Four times a day (QID) | RESPIRATORY_TRACT | Status: DC

## 2020-02-18 MED ORDER — MENTHOL 5.8 MG MT LOZG
1.0000 | LOZENGE | Freq: Every day | OROMUCOSAL | Status: DC | PRN
  Administered 2020-02-19 – 2020-02-20 (×4): 5.8 mg via ORAL
  Filled 2020-02-18 (×6): qty 1

## 2020-02-18 MED ORDER — DM-GUAIFENESIN ER 30-600 MG PO TB12
1.0000 | ORAL_TABLET | Freq: Every day | ORAL | Status: DC
  Administered 2020-02-18 – 2020-02-20 (×3): 1 via ORAL
  Filled 2020-02-18 (×4): qty 1

## 2020-02-18 MED ORDER — ALBUTEROL SULFATE (2.5 MG/3ML) 0.083% IN NEBU: 2.5000 mg | INHALATION_SOLUTION | Freq: Four times a day (QID) | RESPIRATORY_TRACT | Status: DC

## 2020-02-18 MED ORDER — IPRATROPIUM-ALBUTEROL 0.5-2.5 (3) MG/3ML IN SOLN
3.0000 mL | Freq: Four times a day (QID) | RESPIRATORY_TRACT | Status: DC
  Administered 2020-02-19 – 2020-02-21 (×9): 3 mL via RESPIRATORY_TRACT
  Filled 2020-02-18 (×7): qty 3

## 2020-02-18 MED ORDER — IPRATROPIUM-ALBUTEROL 0.5-2.5 (3) MG/3ML IN SOLN
RESPIRATORY_TRACT | Status: AC
  Filled 2020-02-18: qty 3

## 2020-02-18 MED ORDER — SODIUM CHLORIDE 0.9 % IV SOLN
500.0000 mg | INTRAVENOUS | Status: DC
  Administered 2020-02-19 – 2020-02-20 (×2): 500 mg via INTRAVENOUS
  Filled 2020-02-18 (×3): qty 500

## 2020-02-18 MED ORDER — SENNA 8.6 MG PO TABS
1.0000 | ORAL_TABLET | Freq: Every day | ORAL | Status: DC
  Administered 2020-02-19 – 2020-02-21 (×2): 8.6 mg via ORAL
  Filled 2020-02-18 (×2): qty 1

## 2020-02-18 MED ORDER — PANTOPRAZOLE SODIUM 40 MG PO TBEC
40.0000 mg | DELAYED_RELEASE_TABLET | Freq: Every day | ORAL | Status: DC
  Administered 2020-02-19 – 2020-02-21 (×3): 40 mg via ORAL
  Filled 2020-02-18 (×3): qty 1

## 2020-02-18 MED ORDER — SODIUM CHLORIDE 0.9 % IV SOLN
2.0000 g | Freq: Once | INTRAVENOUS | Status: AC
  Administered 2020-02-18: 2 g via INTRAVENOUS
  Filled 2020-02-18: qty 20

## 2020-02-18 MED ORDER — HYDRALAZINE HCL 10 MG PO TABS
10.0000 mg | ORAL_TABLET | Freq: Three times a day (TID) | ORAL | Status: DC | PRN
  Administered 2020-02-21: 10 mg via ORAL
  Filled 2020-02-18 (×3): qty 1

## 2020-02-18 MED ORDER — MAGNESIUM HYDROXIDE 400 MG/5ML PO SUSP: 30.0000 mL | Freq: Every evening | ORAL | Status: DC | PRN

## 2020-02-18 MED ORDER — NITROGLYCERIN 0.4 MG SL SUBL: 0.4000 mg | SUBLINGUAL_TABLET | SUBLINGUAL | Status: DC | PRN

## 2020-02-18 MED ORDER — ATORVASTATIN CALCIUM 20 MG PO TABS
80.0000 mg | ORAL_TABLET | Freq: Every day | ORAL | Status: DC
  Administered 2020-02-20: 80 mg via ORAL
  Filled 2020-02-18 (×2): qty 4

## 2020-02-18 MED ORDER — SODIUM CHLORIDE 0.9 % IV SOLN
500.0000 mg | Freq: Once | INTRAVENOUS | Status: AC
  Administered 2020-02-18: 500 mg via INTRAVENOUS
  Filled 2020-02-18: qty 500

## 2020-02-18 MED ORDER — METHYLPREDNISOLONE SODIUM SUCC 40 MG IJ SOLR
40.0000 mg | Freq: Four times a day (QID) | INTRAMUSCULAR | Status: AC
  Administered 2020-02-18 – 2020-02-19 (×4): 40 mg via INTRAVENOUS
  Filled 2020-02-18 (×4): qty 1

## 2020-02-18 MED ORDER — METHYLPREDNISOLONE SODIUM SUCC 125 MG IJ SOLR
125.0000 mg | Freq: Once | INTRAMUSCULAR | Status: AC
  Administered 2020-02-18: 125 mg via INTRAVENOUS
  Filled 2020-02-18: qty 2

## 2020-02-18 MED ORDER — ENOXAPARIN SODIUM 40 MG/0.4ML ~~LOC~~ SOLN
40.0000 mg | SUBCUTANEOUS | Status: DC
  Administered 2020-02-18 – 2020-02-20 (×3): 40 mg via SUBCUTANEOUS
  Filled 2020-02-18 (×3): qty 0.4

## 2020-02-18 NOTE — ED Notes (Addendum)
Unsuccessful attempt to obtain cultures x 2. MD notified, ok to start abx

## 2020-02-18 NOTE — ED Triage Notes (Addendum)
Pt comes with family from home with c/o SOB. Pt has audible wheezing and noted accessory muscle use. Pt has productive cough. Pt has hx of COPD.  Pt has had increased weakness per family and having to use a wheelchair.

## 2020-02-18 NOTE — ED Provider Notes (Addendum)
[facility] Emergency Department Provider Note  ____________________________________________   First MD Initiated Contact with Patient 02/18/20 1521     (approximate)  I have reviewed the triage vital signs and the nursing notes.   HISTORY  Chief Complaint Shortness of Breath    HPI Gary York is a 86 y.o. male  With h/o HTN, HLD, COPD, CAD, here with cough, SOB.  History provided primarily by his daughter. Pt has known COPD and was just treated for COPD/bronchitis one month ago, had slight improvement with prednisone and ABX. Over the last week, he's had progressively worsening cough and SOB. He has now had significant weakness the last 24 hours and was having difficulty getting around his house. He has 24 hr help at home who told him to come here. He has had chills but no known fever. He's been coughing more and more throughout the day. No known sick contacts. He has been wheezing, with minimal improvement with nebs at home.     Past Medical History:  Diagnosis Date  . CAD (coronary artery disease)   . Complete heart block (HCC)   . Hyperlipidemia   . Hypertension     Patient Active Problem List   Diagnosis Date Noted  . Pain due to onychomycosis of toenails of both feet 01/21/2019  . CHF (congestive heart failure) (HCC) 09/16/2017  . Chest pain 09/09/2017    Past Surgical History:  Procedure Laterality Date  . PACEMAKER IMPLANT      Prior to Admission medications   Medication Sig Start Date End Date Taking? Authorizing Provider  acetaminophen (TYLENOL) 500 MG tablet Take 500 mg by mouth every 6 (six) hours as needed for fever.    [provider]  albuterol (PROVENTIL HFA;VENTOLIN HFA) 108 (90 Base) MCG/ACT inhaler Inhale 2 puffs into the lungs every 6 (six) hours as needed for wheezing or shortness of breath. 09/12/17   Enid Baas, MD  albuterol (PROVENTIL) (2.5 MG/3ML) 0.083% nebulizer solution TAKE 3 MLS (2.5 MG TOTAL) BY  NEBULIZATION 3 TIMES A DAY DIAGNOSIS J44.9**PA DENIED, NOT COVERED** 11/09/18   [provider]  aspirin EC 81 MG EC tablet Take 1 tablet (81 mg total) by mouth daily. 09/13/17   Enid Baas, MD  atorvastatin (LIPITOR) 80 MG tablet Take 1 tablet (80 mg total) by mouth daily at 6 PM. 09/12/17   Enid Baas, MD  clopidogrel (PLAVIX) 75 MG tablet Take 1 tablet (75 mg total) by mouth daily. 09/13/17   Enid Baas, MD  diltiazem (CARDIZEM CD) 180 MG 24 hr capsule Take by mouth. 10/05/18 10/05/19  [provider]  doxazosin (CARDURA) 2 MG tablet Take by mouth. 08/13/18   [provider]  Fluticasone-Salmeterol (ADVAIR DISKUS) 250-50 MCG/DOSE AEPB Inhale 1 puff into the lungs 2 (two) times daily. 09/12/17 09/12/18  Enid Baas, MD  furosemide (LASIX) 20 MG tablet TAKE 1 TABLET BY MOUTH TWICE A DAY 01/01/19   [provider]  isosorbide mononitrate (IMDUR) 30 MG 24 hr tablet Take by mouth. 06/17/18 06/17/19  [provider]  levalbuterol (XOPENEX) 1.25 MG/0.5ML nebulizer solution Take 1.25 mg by nebulization every 6 (six) hours as needed for wheezing or shortness of breath. Copd exacerbation V44.1 09/18/17 09/18/18  Alford Highland, MD  losartan (COZAAR) 100 MG tablet Take 100 mg by mouth daily. 11/20/16   [provider]  magnesium hydroxide (MILK OF MAGNESIA) 400 MG/5ML suspension Take 30 mLs by mouth at bedtime as needed for mild constipation.  [provider]  metoprolol succinate (TOPROL-XL) 25 MG 24 hr tablet TAKE 1 TABLET BY MOUTH EVERY DAY 12/31/18   [provider]  metoprolol tartrate (LOPRESSOR) 25 MG tablet Take 25 mg by mouth 2 (two) times daily. 08/13/18   [provider]  nitroGLYCERIN (NITROSTAT) 0.4 MG SL tablet Place under the tongue. 08/10/18   [provider]  pantoprazole (PROTONIX) 40 MG tablet TAKE 1 TABLET BY MOUTH EVERY DAY 12/07/18   [provider]  polyethylene glycol  (MIRALAX / GLYCOLAX) packet Take 17 g by mouth daily as needed. Patient taking differently: Take 17 g by mouth daily as needed for mild constipation or moderate constipation.  09/18/17   Alford Highland, MD  predniSONE (DELTASONE) 10 MG tablet Prednisone 40 mg p.o. daily x2 days Then 30 mg p.o. daily x2 days Then 20 mg p.o. daily x2 days Then 10 mg p.o. daily x2 days 06/14/18   Jama Flavors, MD  UNABLE TO FIND Take by mouth.    [provider]    Allergies Patient has no known allergies.  Family History  Problem Relation Age of Onset  . Dementia Mother   . Diabetes Mother   . Dementia Father     Social History Social History   Tobacco Use  . Smoking status: Former Smoker    Packs/day: 1.00    Years: 20.00    Pack years: 20.00    Types: Cigarettes    Quit date: 1990    Years since quitting: 31.9  . Smokeless tobacco: Never Used  Substance Use Topics  . Alcohol use: Not Currently  . Drug use: Never    Review of Systems  Review of Systems  Constitutional: Positive for fatigue. Negative for chills and fever.  HENT: Negative for sore throat.   Respiratory: Positive for cough, shortness of breath and wheezing.   Cardiovascular: Negative for chest pain.  Gastrointestinal: Negative for abdominal pain.  Genitourinary: Negative for flank pain.  Musculoskeletal: Negative for neck pain.  Skin: Negative for rash and wound.  Allergic/Immunologic: Negative for immunocompromised state.  Neurological: Positive for weakness. Negative for numbness.  Hematological: Does not bruise/bleed easily.  All other systems reviewed and are negative.    ____________________________________________  PHYSICAL EXAM:      VITAL SIGNS: ED Triage Vitals  Enc Vitals Group     BP 02/18/20 1346 (!) 173/77     Pulse Rate 02/18/20 1346 72     Resp 02/18/20 1346 (!) 23     Temp 02/18/20 1346 97.9 F (36.6 C)     Temp src --      SpO2 02/18/20 1346 94 %     Weight 02/18/20 1347 202 lb  (91.6 kg)     Height 02/18/20 1347 5\' 9"  (1.753 m)     Head Circumference --      Peak Flow --      Pain Score 02/18/20 1346 0     Pain Loc --      Pain Edu? --      Excl. in GC? --      Physical Exam Vitals and nursing note reviewed.  Constitutional:      General: He is not in acute distress.    Appearance: He is well-developed.  HENT:     Head: Normocephalic and atraumatic.  Eyes:     Conjunctiva/sclera: Conjunctivae normal.  Cardiovascular:     Rate and Rhythm: Normal rate and regular rhythm.     Heart sounds: Normal heart  sounds. No murmur heard.  No friction rub.  Pulmonary:     Effort: Pulmonary effort is normal. Tachypnea present. No respiratory distress.     Breath sounds: Examination of the right-upper field reveals wheezing. Examination of the left-upper field reveals wheezing. Examination of the right-middle field reveals wheezing. Examination of the left-middle field reveals wheezing. Examination of the right-lower field reveals wheezing. Examination of the left-lower field reveals wheezing. Wheezing present. No rales.  Abdominal:     General: There is no distension.     Palpations: Abdomen is soft.     Tenderness: There is no abdominal tenderness.  Musculoskeletal:     Cervical back: Neck supple.  Skin:    General: Skin is warm.     Capillary Refill: Capillary refill takes less than 2 seconds.  Neurological:     Mental Status: He is alert and oriented to person, place, and time.     Motor: No abnormal muscle tone.       ____________________________________________   LABS (all labs ordered are listed, but only abnormal results are displayed)  Labs Reviewed  BASIC METABOLIC PANEL - Abnormal; Notable for the following components:      Result Value   Sodium 123 (*)    Chloride 90 (*)    Glucose, Bld 146 (*)    Calcium 8.8 (*)    All other components within normal limits  CBC - Abnormal; Notable for the following components:   RBC 3.63 (*)     Hemoglobin 11.9 (*)    HCT 34.7 (*)    All other components within normal limits  TROPONIN I (HIGH SENSITIVITY) - Abnormal; Notable for the following components:   Troponin I (High Sensitivity) 30 (*)    All other components within normal limits  RESP PANEL BY RT-PCR (FLU A&B, COVID) [id number]  TROPONIN I (HIGH SENSITIVITY)    ____________________________________________  EKG: AV paced rhythm, VR 108. QRS 204, QTc 402. No acute St elevations. No Sgarbossa criteria. ________________________________________  RADIOLOGY All imaging, including plain films, CT scans, and ultrasounds, independently reviewed by me, and interpretations confirmed via formal radiology reads.  ED MD interpretation:   CXR: Bronchitis, possible left basilar PNA  Official radiology report(s): DG Chest 2 View  Result Date: 02/18/2020 CLINICAL DATA:  Shortness of breath. Productive cough and wheezing. History of COPD. EXAM: CHEST - 2 VIEW COMPARISON:  06/15/2018 FINDINGS: A pacemaker remains in place with leads terminating over the right atrium and right ventricle. The cardiomediastinal silhouette is unchanged with normal heart size. Aortic atherosclerosis is noted. There is chronic elevation of the right hemidiaphragm with right basilar atelectasis. There are chronic bronchitic changes with new mild opacity in the left lung base. No overt pulmonary edema, sizable pleural effusion, or pneumothorax is identified. No acute osseous abnormality is seen. IMPRESSION: Chronic bronchitic changes with new mild left basilar opacity which may reflect pneumonia. Electronically Signed   By: Sebastian Ache M.D.   On: 02/18/2020 14:21    ____________________________________________  PROCEDURES   Procedure(s) performed (including Critical Care):  .1-3 Lead EKG Interpretation Performed by: Shaune Pollack, MD Authorized by: Shaune Pollack, MD     Interpretation: non-specific     ECG rate:  70-100   ECG rate assessment: normal      Rhythm comment:  AV paced   Ectopy: none     Conduction: normal   Comments:     Indication: sob    ____________________________________________  INITIAL IMPRESSION / MDM / ASSESSMENT AND PLAN / ED  COURSE  As part of my medical decision making, I reviewed the following data within the electronic MEDICAL RECORD NUMBER Nursing notes reviewed and incorporated, Old chart reviewed, Notes from prior ED visits, and [place] Controlled Substance Database       *Gary York was evaluated in Emergency Department on 02/18/2020 for the symptoms described in the history of present illness. He was evaluated in the context of the global COVID-19 pandemic, which necessitated consideration that the patient might be at risk for infection with the SARS-CoV-2 virus that causes COVID-19. Institutional protocols and algorithms that pertain to the evaluation of patients at risk for COVID-19 are in a state of rapid change based on information released by regulatory bodies including the CDC and federal and state organizations. These policies and algorithms were followed during the patient's care in the ED.  Some ED evaluations and interventions may be delayed as a result of limited staffing during the pandemic.*     Medical Decision Making:  86 yo M here with cough, SOB. Suspect COPD exacerbation now c/b possible CAP. Pt is afebrile, HDS without signs of sepsis. He is not hypoxic. He does have diffuse wheezing and increased WOB, however. Difficulty stick so cultures unable to obtain but will tx empirically with steroids, abx, and admit. Duonebs given. CXR reviewed by me and is c/w mild CAP. COVID negative. Mild trop elevation noted on labs likely demand. CBC without leukocytosis. BMP shows hyponatremia, likely 2/2 his PNA and hypovolemia but will check Legionella as well.   ____________________________________________  FINAL CLINICAL IMPRESSION(S) / ED DIAGNOSES  Final diagnoses:  None     MEDICATIONS GIVEN DURING  THIS VISIT:  Medications - No data to display   ED Discharge Orders    None       Note:  This document was prepared using Dragon voice recognition software and may include unintentional dictation errors.   Shaune PollackIsaacs, Kweku Stankey, MD 02/18/20 Ferrel [id number]    Shaune PollackIsaacs, Cherryl Babin, MD 02/18/20 1807

## 2020-02-18 NOTE — H&P (Addendum)
History and Physical    Gary York VEL:[id number] DOB: 1922-02-16 DOA: 02/18/2020  PCP: Marisue Ivan, MD   Patient coming from: Home.   I have personally briefly reviewed patient's old medical records in [facility]  Chief Complaint: Shortness of breath and wheezing.  HPI: Gary York is a 86 y.o. male with medical history significant of CAD, chronic systolic CHF, complete heart block, pacemaker placement, COPD, hyperlipidemia, hypertension, aortic stenosis who is brought to the emergency department by family from home due to progressively worse dyspnea, occasionally productive cough associated with generalized weakness and fatigue.  He was treated about a month ago for COPD exacerbation with prednisone and antibiotic.  He was doing better until about a week ago, when he started having difficulty ambulating at home.  He has complained of chills, but no fever.  He denies having headache, sore throat, chest, abdominal or back pain at this time.  ED Course: Initial vital signs were temperature 97.9 F, pulse 72, respirations 23, blood pressure 173/77 mmHg and O2 sat 94% on room air.  CBC showed a white count of 8.9, hemoglobin 11.9 g/dL platelets 258.  Troponin was 30 and then 32 ng/L.  Sodium 123, potassium 3.9, chloride 90 and CO2 24 mmol/L.  Renal function was normal.  Glucose 146 and calcium 8.8 mg/dL.  LFTs were unremarkable except for an indirect bilirubin of 1.0 mg/dL.  Coronavirus 2 and influenza PCR were negative.  Chest radiograph has questionable small LLL infiltrate.  Review of Systems: As per HPI otherwise all other systems reviewed and are negative.  Past Medical History:  Diagnosis Date  . CAD (coronary artery disease)   . Chronic systolic CHF (congestive heart failure) (HCC) 02/18/2020  . Complete heart block (HCC)   . COPD (chronic obstructive pulmonary disease) (HCC)   . Hyperlipidemia   . Hypertension     Past Surgical History:  Procedure Laterality  Date  . PACEMAKER IMPLANT     Social History  reports that he quit smoking about 31 years ago. His smoking use included cigarettes. He has a 20.00 pack-year smoking history. He has never used smokeless tobacco. He reports previous alcohol use. He reports that he does not use drugs.  No Known Allergies  Family History  Problem Relation Age of Onset  . Dementia Mother   . Diabetes Mother   . Dementia Father    Prior to Admission medications   Medication Sig Start Date End Date Taking? Authorizing Provider  acetaminophen (TYLENOL) 500 MG tablet Take 500 mg by mouth every 6 (six) hours as needed for mild pain, moderate pain or fever.    Yes [provider]  albuterol (PROVENTIL HFA;VENTOLIN HFA) 108 (90 Base) MCG/ACT inhaler Inhale 2 puffs into the lungs every 6 (six) hours as needed for wheezing or shortness of breath. 09/12/17  Yes Enid Baas, MD  albuterol (PROVENTIL) (2.5 MG/3ML) 0.083% nebulizer solution Take 2.5 mg by nebulization every 6 (six) hours as needed for wheezing or shortness of breath.    Yes [provider]  aspirin EC 81 MG EC tablet Take 1 tablet (81 mg total) by mouth daily. 09/13/17  Yes Enid Baas, MD  atorvastatin (LIPITOR) 80 MG tablet Take 1 tablet (80 mg total) by mouth daily at 6 PM. 09/12/17  Yes Enid Baas, MD  dextromethorphan-guaiFENesin [facility] DM) 30-600 MG 12hr tablet Take 1 tablet by mouth at bedtime.   Yes [provider]  docusate sodium (COLACE) 100 MG capsule Take 100 mg  by mouth daily.   Yes [provider]  Fluticasone-Umeclidin-Vilant (TRELEGY ELLIPTA) 100-62.5-25 MCG/INH AEPB Inhale 1 puff into the lungs daily.   Yes [provider]  isosorbide mononitrate (IMDUR) 30 MG 24 hr tablet Take 30 mg by mouth daily.    Yes [provider]  losartan (COZAAR) 100 MG tablet Take 100 mg by mouth daily. 11/20/16  Yes [provider]  magnesium hydroxide (MILK OF MAGNESIA) 400  MG/5ML suspension Take 30 mLs by mouth at bedtime as needed for mild constipation.   Yes [provider]  metoprolol succinate (TOPROL-XL) 25 MG 24 hr tablet Take 25 mg by mouth daily.    Yes [provider]  nitroGLYCERIN (NITROSTAT) 0.4 MG SL tablet Place 0.4 mg under the tongue every 5 (five) minutes as needed for chest pain.  08/10/18  Yes [provider]  pantoprazole (PROTONIX) 40 MG tablet Take 40 mg by mouth daily.    Yes [provider]  polyethylene glycol (MIRALAX / GLYCOLAX) packet Take 17 g by mouth daily as needed. Patient taking differently: Take 17 g by mouth daily as needed for mild constipation or moderate constipation.  09/18/17  Yes Wieting, Richard, MD  senna (SENOKOT) 8.6 MG TABS tablet Take 1 tablet by mouth daily.   Yes [provider]   Physical Exam: Vitals:   02/18/20 1346 02/18/20 1347  BP: (!) 173/77   Pulse: 72   Resp: (!) 23   Temp: 97.9 F (36.6 C)   SpO2: 94%   Weight:  91.6 kg  Height:  5\' 9"  (1.753 m)   Constitutional: NAD, calm, comfortable Eyes: PERRL, lids and conjunctivae normal ENMT: Mucous membranes are moist. Posterior pharynx clear of any exudate or lesions.  Neck: normal, supple, no masses, no thyromegaly Respiratory: Mildly decreased breath sounds with mild bilateral wheezing, no crackles. Normal respiratory effort. No accessory muscle use.  Cardiovascular: Regular rate and rhythm, no murmurs / rubs / gallops. No extremity edema. 2+ pedal pulses. No carotid bruits.  Abdomen: Obese, nondistended.  Bowel sounds positive.  Soft, no tenderness, no masses palpated. No hepatosplenomegaly. Musculoskeletal: no clubbing / cyanosis.  Good ROM, no contractures. Normal muscle tone.  Skin: no rashes, lesions, ulcers on very limited dermatological examination. Neurologic: CN 2-12 grossly intact. Sensation intact, DTR normal. Strength 5/5 in all 4.  Psychiatric: Alert and oriented x 1, knows he is in the hospital,  but unable to specify.  Labs on Admission: I have personally reviewed following labs and imaging studies  CBC: Recent Labs  Lab 02/18/20 1350  WBC 8.9  HGB 11.9*  HCT 34.7*  MCV 95.6  PLT 196    Basic Metabolic Panel: Recent Labs  Lab 02/18/20 1350  NA 123*  K 3.9  CL 90*  CO2 24  GLUCOSE 146*  BUN 15  CREATININE 1.05  CALCIUM 8.8*    GFR: Estimated Creatinine Clearance: 46 mL/min (by C-G formula based on SCr of 1.05 mg/dL).  Liver Function Tests: Recent Labs  Lab 02/18/20 1744  AST 35  ALT 26  ALKPHOS 89  BILITOT 1.2  PROT 6.8  ALBUMIN 4.2   Radiological Exams on Admission: DG Chest 2 View  Result Date: 02/18/2020 CLINICAL DATA:  Shortness of breath. Productive cough and wheezing. History of COPD. EXAM: CHEST - 2 VIEW COMPARISON:  06/15/2018 FINDINGS: A pacemaker remains in place with leads terminating over the right atrium and right ventricle. The cardiomediastinal silhouette is unchanged with normal heart size. Aortic atherosclerosis is noted.  There is chronic elevation of the right hemidiaphragm with right basilar atelectasis. There are chronic bronchitic changes with new mild opacity in the left lung base. No overt pulmonary edema, sizable pleural effusion, or pneumothorax is identified. No acute osseous abnormality is seen. IMPRESSION: Chronic bronchitic changes with new mild left basilar opacity which may reflect pneumonia. Electronically Signed   By: Sebastian Ache M.D.   On: 02/18/2020 14:21    EKG: Independently reviewed.   Assessment/Plan Principal Problem:   COPD exacerbation (HCC) Observation/telemetry. Continue supplemental oxygen. Continue scheduled and as needed bronchodilators. Continue Solu-Medrol followed by prednisone taper. Consult TOC, PT/OT and nutritional services. Continue antibiotics for CPAP pending cultures and procalcitonin. Check strep pneumonia and Legionella urinary antigen. Follow-up blood culture and sensitivity.  Active  Problems:   Hyperglycemia Monitor blood glucose level. The patient is receiving glucocorticoids.    Chronic systolic CHF (congestive heart failure) (HCC) No signs of decompensation. Continue losartan and metoprolol.    Aortic stenosis Follow-up with cardiology.    Hyperlipidemia Continue atorvastatin 80 mg p.o. daily.    Hyponatremia Daughter states he drinks a lot of water. Begin fluid restriction Follow-up sodium level in the morning. Obtain Legionella urinary antigen.    Hypocalcemia Recheck calcium level in a.m.    Normocytic anemia Monitor H&H.     DVT prophylaxis: Lovenox SQ. Code Status:   DNR. Family Communication:  His daughter was by bedside. Disposition Plan:   Patient is from:  Home.  Anticipated DC to:  Home.  Anticipated DC date:               01/20/2019  Anticipated DC barriers: Clinical status.  Consults called: Admission status:  Observation/MedSurg/telemetry.   Severity of Illness: High due to COPD exacerbation with hypoxia that did not resolve completely despite multiple DuoNeb treatments and glucocorticoids.  The patient is also being treated for possible LLL pneumonia.  Bobette Mo MD [facility]  How to contact the [facility] Attending or Consulting provider 7A - 7P or covering provider during after hours 7P -7A, for this patient?   1. Check the care team in [facility] and look for a) attending/consulting [facility] provider listed and b) the [facility] team listed 2. Log into [email or web address] and use [place]'s universal password to access. If you do not have the password, please contact the hospital operator. 3. Locate the [facility] provider you are looking for under [facility] and page to a number that you can be directly reached. 4. If you still have difficulty reaching the provider, please page the [facility] (Director on Call) for the Hospitalists listed on amion for assistance.  02/18/2020, 8:18 PM   This document was prepared using Dragon voice  recognition software and may contain some unintended transcription errors.

## 2020-02-18 NOTE — ED Notes (Signed)
Medication Reconciliation Report  For Home History Technicians  HIGHLIGHTS:  1. The patient WAS NOT personally interviewed 2. If not, what was the main source used: PHARMACY RECORDS 3. Does the patient appear to take any anti-coagulation agents (e.g. warfarin, Eliquis or Xarelto): NO 4. Does the patient appear to take any anti-convulsant agents (e.g. divalproex, levetiracetam or phenytoin): NO 5. Does the patient appear to use any insulin products (e.g. Lantus, Novolin or Humalog): NO 6. Does the patient appear to take any "beta-blockers" (e.g. metoprolol, carvedilol or bisoprolol: YES  BARRIERS:  1. Were there any barriers that prevented or complicated the medication reconciliation process: YES 2. If yes, what was the primary barrier encountered: Condition prevents interview 3. Does the patient appear compliant with prescribed medications: UNABLE TO DETERMINE 4. Does the patient express any barriers with compliance: UNABLE TO DETERMINE 5. What is the primary barrier the patient reports: None   NOTES:[Include any concerns, remarks or complaints the patient expresses regarding medication therapy. Any observations or other information that might be useful to the treatment team can also be included. Immediate needs or concerns should be referred to the RN or appropriate member of the treatment team.]  Patient with pneumonia and severe cough.     Elmo Putt, CPhT [place] [facility] [address] [place], [place] [address] [phone]  ** The above is intended solely for informational and/or communicative purposes. It should in no way be considered an endorsement of any specific treatment, therapy or action. **

## 2020-02-19 DIAGNOSIS — Z7902 Long term (current) use of antithrombotics/antiplatelets: Secondary | ICD-10-CM | POA: Diagnosis not present

## 2020-02-19 DIAGNOSIS — R7401 Elevation of levels of liver transaminase levels: Secondary | ICD-10-CM | POA: Diagnosis present

## 2020-02-19 DIAGNOSIS — J440 Chronic obstructive pulmonary disease with acute lower respiratory infection: Secondary | ICD-10-CM | POA: Diagnosis present

## 2020-02-19 DIAGNOSIS — E871 Hypo-osmolality and hyponatremia: Secondary | ICD-10-CM | POA: Diagnosis not present

## 2020-02-19 DIAGNOSIS — I350 Nonrheumatic aortic (valve) stenosis: Secondary | ICD-10-CM | POA: Diagnosis present

## 2020-02-19 DIAGNOSIS — Z20822 Contact with and (suspected) exposure to covid-19: Secondary | ICD-10-CM | POA: Diagnosis present

## 2020-02-19 DIAGNOSIS — T380X5A Adverse effect of glucocorticoids and synthetic analogues, initial encounter: Secondary | ICD-10-CM | POA: Diagnosis present

## 2020-02-19 DIAGNOSIS — D649 Anemia, unspecified: Secondary | ICD-10-CM | POA: Diagnosis present

## 2020-02-19 DIAGNOSIS — I5022 Chronic systolic (congestive) heart failure: Secondary | ICD-10-CM | POA: Diagnosis present

## 2020-02-19 DIAGNOSIS — Z66 Do not resuscitate: Secondary | ICD-10-CM | POA: Diagnosis present

## 2020-02-19 DIAGNOSIS — E861 Hypovolemia: Secondary | ICD-10-CM | POA: Diagnosis present

## 2020-02-19 DIAGNOSIS — E785 Hyperlipidemia, unspecified: Secondary | ICD-10-CM | POA: Diagnosis present

## 2020-02-19 DIAGNOSIS — I110 Hypertensive heart disease with heart failure: Secondary | ICD-10-CM | POA: Diagnosis present

## 2020-02-19 DIAGNOSIS — I2510 Atherosclerotic heart disease of native coronary artery without angina pectoris: Secondary | ICD-10-CM | POA: Diagnosis present

## 2020-02-19 DIAGNOSIS — J189 Pneumonia, unspecified organism: Secondary | ICD-10-CM | POA: Diagnosis present

## 2020-02-19 DIAGNOSIS — Z79899 Other long term (current) drug therapy: Secondary | ICD-10-CM | POA: Diagnosis not present

## 2020-02-19 DIAGNOSIS — Z7982 Long term (current) use of aspirin: Secondary | ICD-10-CM | POA: Diagnosis not present

## 2020-02-19 DIAGNOSIS — I442 Atrioventricular block, complete: Secondary | ICD-10-CM | POA: Diagnosis present

## 2020-02-19 DIAGNOSIS — J9601 Acute respiratory failure with hypoxia: Secondary | ICD-10-CM | POA: Diagnosis present

## 2020-02-19 DIAGNOSIS — J441 Chronic obstructive pulmonary disease with (acute) exacerbation: Secondary | ICD-10-CM | POA: Diagnosis not present

## 2020-02-19 DIAGNOSIS — G9341 Metabolic encephalopathy: Secondary | ICD-10-CM | POA: Diagnosis present

## 2020-02-19 DIAGNOSIS — R739 Hyperglycemia, unspecified: Secondary | ICD-10-CM | POA: Diagnosis present

## 2020-02-19 DIAGNOSIS — Z950 Presence of cardiac pacemaker: Secondary | ICD-10-CM | POA: Diagnosis not present

## 2020-02-19 LAB — COMPREHENSIVE METABOLIC PANEL
ALT: 65 U/L — ABNORMAL HIGH (ref 0–44)
AST: 79 U/L — ABNORMAL HIGH (ref 15–41)
Albumin: 4 g/dL (ref 3.5–5.0)
Alkaline Phosphatase: 126 U/L (ref 38–126)
Anion gap: 11 (ref 5–15)
BUN: 17 mg/dL (ref 8–23)
CO2: 21 mmol/L — ABNORMAL LOW (ref 22–32)
Calcium: 8.8 mg/dL — ABNORMAL LOW (ref 8.9–10.3)
Chloride: 93 mmol/L — ABNORMAL LOW (ref 98–111)
Creatinine, Ser: 1.03 mg/dL (ref 0.61–1.24)
GFR, Estimated: >60 mL/min (ref >60)
Glucose, Bld: 185 mg/dL — ABNORMAL HIGH (ref 70–99)
Potassium: 4.6 mmol/L (ref 3.5–5.1)
Sodium: 125 mmol/L — ABNORMAL LOW (ref 135–145)
Total Bilirubin: 1.3 mg/dL — ABNORMAL HIGH (ref 0.3–1.2)
Total Protein: 7.1 g/dL (ref 6.5–8.1)

## 2020-02-19 LAB — GLUCOSE, CAPILLARY
Glucose-Capillary: 178 mg/dL — ABNORMAL HIGH (ref 70–99)
Glucose-Capillary: 162 mg/dL — ABNORMAL HIGH (ref 70–99)
Glucose-Capillary: 180 mg/dL — ABNORMAL HIGH (ref 70–99)
Glucose-Capillary: 172 mg/dL — ABNORMAL HIGH (ref 70–99)

## 2020-02-19 LAB — CBC
HCT: 33.8 % — ABNORMAL LOW (ref 39.0–52.0)
Hemoglobin: 11.8 g/dL — ABNORMAL LOW (ref 13.0–17.0)
MCH: 33.3 pg (ref 26.0–34.0)
MCHC: 34.9 g/dL (ref 30.0–36.0)
MCV: 95.5 fL (ref 80.0–100.0)
Platelets: 221 10*3/uL (ref 150–400)
RBC: 3.54 MIL/uL — ABNORMAL LOW (ref 4.22–5.81)
RDW: 14.2 % (ref 11.5–15.5)
WBC: 6.7 10*3/uL (ref 4.0–10.5)
nRBC: 0 % (ref 0.0–0.2)

## 2020-02-19 LAB — SODIUM: Sodium: 123 mmol/L — ABNORMAL LOW (ref 135–145)

## 2020-02-19 LAB — PROCALCITONIN: Procalcitonin: <0.1 ng/mL

## 2020-02-19 MED ORDER — CALCIUM CARBONATE ANTACID 500 MG PO CHEW
1.0000 | CHEWABLE_TABLET | Freq: Two times a day (BID) | ORAL | Status: DC
  Administered 2020-02-19 – 2020-02-21 (×5): 200 mg via ORAL
  Filled 2020-02-19 (×5): qty 1

## 2020-02-19 MED ORDER — ENSURE ENLIVE PO LIQD
237.0000 mL | Freq: Two times a day (BID) | ORAL | Status: DC
  Administered 2020-02-20 (×3): 237 mL via ORAL

## 2020-02-19 NOTE — Progress Notes (Signed)
Initial Nutrition Assessment  DOCUMENTATION CODES:   Not applicable  INTERVENTION:  Liberalize diet  Ensure Enlive po BID, each supplement provides 350 kcal and 20 grams of protein  NUTRITION DIAGNOSIS:   Increased nutrient needs related to acute illness, chronic illness (acute exacerbation of COPD) as evidenced by estimated needs.    GOAL:   Patient will meet greater than or equal to 90% of their needs   MONITOR:   Labs, I & O's, Supplement acceptance, Diet advancement, Skin, Weight trends, PO intake  REASON FOR ASSESSMENT:   Consult Assessment of nutrition requirement/status  ASSESSMENT:  RD working remotely.  86 year old male admitted with COPD exacerbation. Past medical history significant of CAD, chronic sCHF, complete heart block s/p pacemaker, COPD, HLD, HTN, aortic stenosis presented with 1 week of progressively worsening dyspnea, occasional productive cough associated with generalized weakness and fatigue.  RD attempted to reach pt via phone, however no answer. Unable to obtain nutrition history at this time. Per flowhsheet, pt with 0% po of lunch tray today. Patient with no history of DM, noted elevated CBGs likely due to steroids.  Spoke with MD via secure chat, okay to liberalize to HH/2000 ml. Suspect pt has experienced ongoing decreased po in the past week secondary to worsening dyspnea, weakness and fatigue. Will order Ensure supplement to aid with meeting needs. Will continue to monitor for po intakes and will plan to complete exam as well as obtain nutrition history at follow-up.  Current wt 201.52 lbs No recent weight history for review, last weight prior to admission 92.6 kg (204.38 lbs) on 06/15/18.   Medications reviewed and include: Tums, Mucinex DM, Methylprednisolone, Senokot, Zithromax, Rocephin  Labs: CBGs 162,178,149, Na 125 (L)   NUTRITION - FOCUSED PHYSICAL EXAM: Unable to complete at this time, RD working remotely.  Diet Order:   Diet  Order            Diet Heart Room service appropriate? Yes; Fluid consistency: Thin; Fluid restriction: 2000 mL Fluid  Diet effective now                 EDUCATION NEEDS:   No education needs have been identified at this time  Skin:  Skin Assessment: Reviewed RN Assessment  Last BM:  11/27  Height:   Ht Readings from Last 1 Encounters:  02/18/20 5\' 9"  (1.753 m)    Weight:   Wt Readings from Last 1 Encounters:  02/18/20 91.6 kg    BMI:  Body mass index is 29.83 kg/m.  Estimated Nutritional Needs:   Kcal:  2000-2200  Protein:  100-110  Fluid:  2 L/day per MD   02/20/20, RD, LDN Clinical Nutrition After Hours/Weekend Pager # in Amion

## 2020-02-19 NOTE — Progress Notes (Signed)
PROGRESS NOTE    Gary York  PPG:[id number] DOB: 11-Nov-1921 DOA: 02/18/2020 PCP: Marisue Ivan, MD    Assessment & Plan:   Principal Problem:   COPD exacerbation (HCC) Active Problems:   Hyperglycemia   Chronic systolic CHF (congestive heart failure) (HCC)   Aortic stenosis   Hyperlipidemia   Hyponatremia   Hypocalcemia   Normocytic anemia   COPD exacerbation: continue on IV steroids, IV azithromycin & bronchodilators. Encourage incentive spirometry. Strep is neg. Legionella is pending  Pneumonia: continue on IV azithromycin, ceftriaxone and bronchodilators. Encourage incentive spirometry. Continue on supplemental oxygen and wean as tolerated  Hyperglycemia: likely secondary to steroid use. No hx of DM. Will continue to monitor   Chronic systolic CHF: continue on home dose of losartan, metoprolol  Aortic stenosis: management per cardio as an outpatient   HLD: continue on statin   Hyponatremia: continue w/ fluid restriction. Repeat Na level this afternoon   Hypocalcemia: will start tums  Normocytic anemia: H&H are stable. No need for a transfusion currently   Transaminitis: mildly elevated. Etiology unclear. Will continue to monitor    DVT prophylaxis: lovenox Code Status: DNR Family Communication: discussed pt's care w/ pt's daughter, Lurena Joiner, via phone and answered her questions  Disposition Plan:  Depends on PT/OT recs   Status is: Observation  The patient remains OBS appropriate and will d/c before 2 midnights.  Dispo: The patient is from: Home              Anticipated d/c is to: Home              Anticipated d/c date is: 3 days              Patient currently is not medically stable to d/c.         Consultants:      Procedures:    Antimicrobials: azithromycin, ceftriaxone   Subjective: Pt c/o fatigue  Objective: Vitals:   02/18/20 2100 02/18/20 2348 02/19/20 0225 02/19/20 0446  BP: (!) 181/93 (!) 189/90  (!) 184/89  Pulse:  70 72  79  Resp: 20 20    Temp:  98.5 F (36.9 C)  97.7 F (36.5 C)  TempSrc:    Oral  SpO2: 100% 97% 97% 99%  Weight:      Height:        Intake/Output Summary (Last 24 hours) at 02/19/2020 0732 Last data filed at 02/18/2020 2033 Gross per 24 hour  Intake 1251.02 ml  Output --  Net 1251.02 ml   Filed Weights   02/18/20 1347  Weight: 91.6 kg    Examination:  General exam: Appears calm and comfortable  Respiratory system: diminished breath sounds b/l  Cardiovascular system: S1 & S2 +. No rubs, gallops or clicks.  Gastrointestinal system: Abdomen is nondistended, soft and nontender. Normal bowel sounds heard. Central nervous system: Alert and oriented. Moves all 4 extremities  Psychiatry: Judgement and insight appear abnormal. Flat mood and affect    Data Reviewed: I have personally reviewed following labs and imaging studies  CBC: Recent Labs  Lab 02/18/20 1350 02/19/20 0452  WBC 8.9 6.7  HGB 11.9* 11.8*  HCT 34.7* 33.8*  MCV 95.6 95.5  PLT 196 221   Basic Metabolic Panel: Recent Labs  Lab 02/18/20 1350 02/19/20 0452  NA 123* 125*  K 3.9 4.6  CL 90* 93*  CO2 24 21*  GLUCOSE 146* 185*  BUN 15 17  CREATININE 1.05 1.03  CALCIUM 8.8* 8.8*   GFR:  Estimated Creatinine Clearance: 46.9 mL/min (by C-G formula based on SCr of 1.03 mg/dL). Liver Function Tests: Recent Labs  Lab 02/18/20 1744 02/19/20 0452  AST 35 79*  ALT 26 65*  ALKPHOS 89 126  BILITOT 1.2 1.3*  PROT 6.8 7.1  ALBUMIN 4.2 4.0   No results for input(s): LIPASE, AMYLASE in the last 168 hours. No results for input(s): AMMONIA in the last 168 hours. Coagulation Profile: No results for input(s): INR, PROTIME in the last 168 hours. Cardiac Enzymes: No results for input(s): CKTOTAL, CKMB, CKMBINDEX, TROPONINI in the last 168 hours. BNP (last 3 results) No results for input(s): PROBNP in the last 8760 hours. HbA1C: No results for input(s): HGBA1C in the last 72 hours. CBG: Recent Labs   Lab 02/18/20 2133  GLUCAP 149*   Lipid Profile: No results for input(s): CHOL, HDL, LDLCALC, TRIG, CHOLHDL, LDLDIRECT in the last 72 hours. Thyroid Function Tests: No results for input(s): TSH, T4TOTAL, FREET4, T3FREE, THYROIDAB in the last 72 hours. Anemia Panel: No results for input(s): VITAMINB12, FOLATE, FERRITIN, TIBC, IRON, RETICCTPCT in the last 72 hours. Sepsis Labs: Recent Labs  Lab 02/18/20 1741 02/19/20 0452  PROCALCITON <0.10 <0.10    Recent Results (from the past 240 hour(s))  Resp Panel by RT-PCR (Flu A&B, Covid) Nasopharyngeal Swab     Status: None   Collection Time: 02/18/20  4:40 PM   Specimen: Nasopharyngeal Swab; Nasopharyngeal(NP) swabs in vial transport medium  Result Value Ref Range Status   SARS Coronavirus 2 by RT PCR NEGATIVE NEGATIVE Final    Comment: (NOTE) SARS-CoV-2 target nucleic acids are NOT DETECTED.  The SARS-CoV-2 RNA is generally detectable in upper respiratory specimens during the acute phase of infection. The lowest concentration of SARS-CoV-2 viral copies this assay can detect is 138 copies/mL. A negative result does not preclude SARS-Cov-2 infection and should not be used as the sole basis for treatment or other patient management decisions. A negative result may occur with  improper specimen collection/handling, submission of specimen other than nasopharyngeal swab, presence of viral mutation(s) within the areas targeted by this assay, and inadequate number of viral copies(<138 copies/mL). A negative result must be combined with clinical observations, patient history, and epidemiological information. The expected result is Negative.  Fact Sheet for Patients:  BloggerCourse.com  Fact Sheet for Healthcare Providers:  SeriousBroker.it  This test is no t yet approved or cleared by the [place] FDA and  has been authorized for detection and/or diagnosis of SARS-CoV-2 by FDA  under an Emergency Use Authorization (EUA). This EUA will remain  in effect (meaning this test can be used) for the duration of the COVID-19 declaration under Section 564(b)(1) of the Act, 21 U.S.C.section 360bbb-3(b)(1), unless the authorization is terminated  or revoked sooner.       Influenza A by PCR NEGATIVE NEGATIVE Final   Influenza B by PCR NEGATIVE NEGATIVE Final    Comment: (NOTE) The Xpert Xpress SARS-CoV-2/FLU/RSV plus assay is intended as an aid in the diagnosis of influenza from Nasopharyngeal swab specimens and should not be used as a sole basis for treatment. Nasal washings and aspirates are unacceptable for Xpert Xpress SARS-CoV-2/FLU/RSV testing.  Fact Sheet for Patients: BloggerCourse.com  Fact Sheet for Healthcare Providers: SeriousBroker.it  This test is not yet approved or cleared by the [place] FDA and has been authorized for detection and/or diagnosis of SARS-CoV-2 by FDA under an Emergency Use Authorization (EUA). This EUA will remain in effect (meaning this test can be used)  for the duration of the COVID-19 declaration under Section 564(b)(1) of the Act, 21 U.S.C. section 360bbb-3(b)(1), unless the authorization is terminated or revoked.  Performed at [facility], [address] [place], [place] [address]   Blood culture (routine x 2)     Status: None (Preliminary result)   Collection Time: 02/18/20  9:23 PM   Specimen: BLOOD  Result Value Ref Range Status   Specimen Description BLOOD BLOOD LEFT HAND  Final   Special Requests   Final    BOTTLES DRAWN AEROBIC AND ANAEROBIC Blood Culture adequate volume   Culture   Final    NO GROWTH < 12 HOURS Performed at [facility], [address] [place], [place] [address]    Report Status PENDING  Incomplete  Blood culture (routine x 2)     Status: None (Preliminary result)   Collection Time: 02/18/20  9:23 PM   Specimen: BLOOD   Result Value Ref Range Status   Specimen Description BLOOD BLOOD RIGHT FOREARM  Final   Special Requests   Final    BOTTLES DRAWN AEROBIC AND ANAEROBIC Blood Culture adequate volume   Culture   Final    NO GROWTH < 12 HOURS Performed at [facility], [address], [place], [place] [address]    Report Status PENDING  Incomplete         Radiology Studies: DG Chest 2 View  Result Date: 02/18/2020 CLINICAL DATA:  Shortness of breath. Productive cough and wheezing. History of COPD. EXAM: CHEST - 2 VIEW COMPARISON:  06/15/2018 FINDINGS: A pacemaker remains in place with leads terminating over the right atrium and right ventricle. The cardiomediastinal silhouette is unchanged with normal heart size. Aortic atherosclerosis is noted. There is chronic elevation of the right hemidiaphragm with right basilar atelectasis. There are chronic bronchitic changes with new mild opacity in the left lung base. No overt pulmonary edema, sizable pleural effusion, or pneumothorax is identified. No acute osseous abnormality is seen. IMPRESSION: Chronic bronchitic changes with new mild left basilar opacity which may reflect pneumonia. Electronically Signed   By: Sebastian Ache M.D.   On: 02/18/2020 14:21        Scheduled Meds:  aspirin EC  81 mg Oral Daily   atorvastatin  80 mg Oral q1800   dextromethorphan-guaiFENesin  1 tablet Oral QHS   enoxaparin (LOVENOX) injection  40 mg Subcutaneous Q24H   ipratropium-albuterol  3 mL Nebulization Q6H   isosorbide mononitrate  30 mg Oral Daily   losartan  100 mg Oral Daily   methylPREDNISolone (SOLU-MEDROL) injection  40 mg Intravenous Q6H   Followed by   Melene Muller ON 02/20/2020] predniSONE  40 mg Oral Q breakfast   metoprolol succinate  25 mg Oral Daily   pantoprazole  40 mg Oral Daily   senna  1 tablet Oral Daily   Continuous Infusions:  azithromycin     cefTRIAXone (ROCEPHIN)  IV       LOS: 0 days    Time spent: 33 mins      Charise Killian, MD [facility] Pager [phone]  If 7PM-7AM, please contact night-coverage  02/19/2020, 7:32 AM

## 2020-02-19 NOTE — Progress Notes (Signed)
OT Cancellation Note  Patient Details Name: Gary York MRN: [id number] DOB: 09/22/1921   Cancelled Treatment:    Reason Eval/Treat Not Completed: Fatigue/lethargy limiting ability to participate;Other (comment) (spoke with Respiratory therapy after leaving pt's room for breathing tx, RT recommending hold therapy this afternoon 2/2 wheezing and O2 needs.). Dtr present, pt sleeping after recently wheezing for breathing tx with RT. Spoke with dtr to verify PLOF/home set up information; entered into flowsheets. Will re-attempt OT evaluation at later date/time as appropriate.   Richrd Prime, MPH, MS, OTR/L ascom [phone] 02/19/20, 2:15 PM

## 2020-02-19 NOTE — Plan of Care (Signed)
  Problem: Education: Goal: Knowledge of General Education information will improve Description: Including pain rating scale, medication(s)/side effects and non-pharmacologic comfort measures Outcome: Progressing   Problem: Health Behavior/Discharge Planning: Goal: Ability to manage health-related needs will improve Outcome: Not Progressing   Problem: Clinical Measurements: Goal: Ability to maintain clinical measurements within normal limits will improve Outcome: Progressing Goal: Respiratory complications will improve Outcome: Not Progressing Goal: Cardiovascular complication will be avoided Outcome: Progressing   Problem: Activity: Goal: Risk for activity intolerance will decrease Outcome: Progressing   Problem: Coping: Goal: Level of anxiety will decrease Outcome: Progressing   Problem: Elimination: Goal: Will not experience complications related to bowel motility Outcome: Progressing Goal: Will not experience complications related to urinary retention Outcome: Progressing   Problem: Pain Managment: Goal: General experience of comfort will improve Outcome: Progressing   Problem: Safety: Goal: Ability to remain free from injury will improve Outcome: Progressing

## 2020-02-19 NOTE — Evaluation (Signed)
Physical Therapy Evaluation Patient Details Name: Gary York MRN: [id number] DOB: January 15, 1922 Today's Date: 02/19/2020   History of Present Illness  Pt is a 86 y.o. male presenting to hospital 11/26 with SOB and cough; pt with significant weakness last 24 hours; difficulty getting around his house.  Pt admitted with COPD exacerbation, hyperglycemia, and chronic systolic CHF.  PMH includes htn, HLD, COPD, CAD, CHF, pacemaker.  Clinical Impression  Prior to hospital admission, pt reports being ambulatory with walker and has 24/7 assist at home [facility]).  Pt oriented to self only and appearing confused during session.  Currently pt is SBA with bed mobility; CGA with transfers using RW; and CGA ambulating 5 feet with RW.  Limited distance ambulating d/t increased wheezing and SOB with activity (O2 sats 95% or greater on 2 L O2 via nasal cannula during sessions activities).  Pt would benefit from skilled PT to address noted impairments and functional limitations (see below for any additional details).  Upon hospital discharge, pt would benefit from continued 24/7 assist and HHPT.    Follow Up Recommendations Home health PT;Supervision/Assistance - 24 hour    Equipment Recommendations  Rolling walker with 5" wheels;3in1 (PT)    Recommendations for Other Services OT consult     Precautions / Restrictions Precautions Precautions: Fall Restrictions Weight Bearing Restrictions: No      Mobility  Bed Mobility Overal bed mobility: Needs Assistance Bed Mobility: Supine to Sit;Sit to Supine     Supine to sit: Supervision;HOB elevated Sit to supine: Supervision;HOB elevated   General bed mobility comments: increased effort to perform on own    Transfers Overall transfer level: Needs assistance Equipment used: Rolling walker (2 wheeled) Transfers: Sit to/from [employer] Sit to Stand: Min guard Stand pivot transfers: Min guard        General transfer comment: increased effort to stand from bed x1 trial CGA; x1 trial standing from recliner CGA; stand step turn bed to recliner with RW CGA  Ambulation/Gait Ambulation/Gait assistance: Min guard Gait Distance (Feet): 5 Feet (recliner to bed) Assistive device: Rolling walker (2 wheeled)   Gait velocity: decreased   General Gait Details: steady with RW; partial step through gait pattern  Stairs            Wheelchair Mobility    Modified Rankin (Stroke Patients Only)       Balance Overall balance assessment: Needs assistance Sitting-balance support: No upper extremity supported;Feet supported Sitting balance-Leahy Scale: Good Sitting balance - Comments: steady sitting reaching within BOS   Standing balance support: Single extremity supported Standing balance-Leahy Scale: Fair Standing balance comment: pt requiring at least single UE support for static standing balance                             Pertinent Vitals/Pain Pain Assessment: Faces Faces Pain Scale: No hurt Pain Intervention(s): Limited activity within patient's tolerance;Monitored during session;Repositioned  HR WFL during sessions activities.    Home Living Family/patient expects to be discharged to:: Private residence [facility]) Living Arrangements: Other (Comment);Alone (Pt reports having rotating couple provide 24/7 assist) Available Help at Discharge: Personal care attendant;Available 24 hours/day Type of Home: House       Home Layout: Two level;Able to live on main level with bedroom/bathroom Home Equipment: Dan Humphreys - 2 wheels;Wheelchair - manual Additional Comments: Pt appearing to be a questionable historian d/t confusion.    Prior Function  Level of Independence: Needs assistance   Gait / Transfers Assistance Needed: pt reports h/o multiple falls (sometimes needing assist to get up); ambulates with RW last 2 weeks (was using cane prior to  that)  ADL's / Homemaking Assistance Needed: pt reports having 24/7 assist   No home O2.  Has family support.     Hand Dominance        Extremity/Trunk Assessment   Upper Extremity Assessment Upper Extremity Assessment: Generalized weakness    Lower Extremity Assessment Lower Extremity Assessment: Generalized weakness    Cervical / Trunk Assessment Cervical / Trunk Assessment: Other exceptions Cervical / Trunk Exceptions: forward head/shoulders  Communication   Communication: No difficulties  Cognition Arousal/Alertness: Awake/alert Behavior During Therapy: Impulsive Overall Cognitive Status: No family/caregiver present to determine baseline cognitive functioning                                 General Comments: Oriented to person only; pt repetitive with questions; appearing confused      General Comments   Nursing cleared pt for participation in physical therapy.  Pt agreeable to PT session with initial encouragement.    Exercises  Transfers   Assessment/Plan    PT Assessment Patient needs continued PT services  PT Problem List Decreased strength;Decreased activity tolerance;Decreased balance;Decreased mobility;Decreased knowledge of use of DME;Cardiopulmonary status limiting activity       PT Treatment Interventions DME instruction;Gait training;Stair training;Functional mobility training;Therapeutic activities;Therapeutic exercise;Balance training;Patient/family education    PT Goals (Current goals can be found in the Care Plan section)  Acute Rehab PT Goals Patient Stated Goal: to improve breathing PT Goal Formulation: With patient Time For Goal Achievement: 03/04/20 Potential to Achieve Goals: Fair    Frequency Min 2X/week   Barriers to discharge        Co-evaluation               AM-PAC PT "6 Clicks" Mobility  Outcome Measure Help needed turning from your back to your side while in a flat bed without using bedrails?:  None Help needed moving from lying on your back to sitting on the side of a flat bed without using bedrails?: A Little Help needed moving to and from a bed to a chair (including a wheelchair)?: A Little Help needed standing up from a chair using your arms (e.g., wheelchair or bedside chair)?: A Little Help needed to walk in hospital room?: A Little Help needed climbing 3-5 steps with a railing? : A Little 6 Click Score: 19    End of Session Equipment Utilized During Treatment: Gait belt;Oxygen (2 L O2 via nasal cannula) Activity Tolerance: Other (comment) (Limited d/t increased wheezing/SOB with activity) Patient left: in bed;with call bell/phone within reach;with bed alarm set Nurse Communication: Mobility status;Precautions PT Visit Diagnosis: Other abnormalities of gait and mobility (R26.89);Muscle weakness (generalized) (M62.81);History of falling (Z91.81);[name], [name]    Time: 1610-9604 PT Time Calculation (min) (ACUTE ONLY): 26 min   Charges:   PT Evaluation $PT Eval Low Complexity: 1 Low PT Treatments $Therapeutic Activity: 8-22 mins       Hendricks Limes, PT 02/19/20, 10:22 AM

## 2020-02-20 DIAGNOSIS — J441 Chronic obstructive pulmonary disease with (acute) exacerbation: Secondary | ICD-10-CM | POA: Diagnosis not present

## 2020-02-20 DIAGNOSIS — J9601 Acute respiratory failure with hypoxia: Secondary | ICD-10-CM

## 2020-02-20 DIAGNOSIS — E871 Hypo-osmolality and hyponatremia: Secondary | ICD-10-CM | POA: Diagnosis not present

## 2020-02-20 LAB — BASIC METABOLIC PANEL
Anion gap: 12 (ref 5–15)
BUN: 25 mg/dL — ABNORMAL HIGH (ref 8–23)
CO2: 20 mmol/L — ABNORMAL LOW (ref 22–32)
Calcium: 8.8 mg/dL — ABNORMAL LOW (ref 8.9–10.3)
Chloride: 91 mmol/L — ABNORMAL LOW (ref 98–111)
Creatinine, Ser: 1.08 mg/dL (ref 0.61–1.24)
GFR, Estimated: >60 mL/min (ref >60)
Glucose, Bld: 132 mg/dL — ABNORMAL HIGH (ref 70–99)
Potassium: 4.7 mmol/L (ref 3.5–5.1)
Sodium: 123 mmol/L — ABNORMAL LOW (ref 135–145)

## 2020-02-20 LAB — CBC
HCT: 32.9 % — ABNORMAL LOW (ref 39.0–52.0)
Hemoglobin: 11.2 g/dL — ABNORMAL LOW (ref 13.0–17.0)
MCH: 32.7 pg (ref 26.0–34.0)
MCHC: 34 g/dL (ref 30.0–36.0)
MCV: 96.2 fL (ref 80.0–100.0)
Platelets: 221 10*3/uL (ref 150–400)
RBC: 3.42 MIL/uL — ABNORMAL LOW (ref 4.22–5.81)
RDW: 14.5 % (ref 11.5–15.5)
WBC: 15.9 10*3/uL — ABNORMAL HIGH (ref 4.0–10.5)
nRBC: 0 % (ref 0.0–0.2)

## 2020-02-20 LAB — SODIUM: Sodium: 124 mmol/L — ABNORMAL LOW (ref 135–145)

## 2020-02-20 LAB — GLUCOSE, CAPILLARY
Glucose-Capillary: 158 mg/dL — ABNORMAL HIGH (ref 70–99)
Glucose-Capillary: 140 mg/dL — ABNORMAL HIGH (ref 70–99)
Glucose-Capillary: 152 mg/dL — ABNORMAL HIGH (ref 70–99)

## 2020-02-20 LAB — PROCALCITONIN: Procalcitonin: <0.1 ng/mL

## 2020-02-20 MED ORDER — METHYLPREDNISOLONE SODIUM SUCC 40 MG IJ SOLR
40.0000 mg | Freq: Two times a day (BID) | INTRAMUSCULAR | Status: DC
  Administered 2020-02-20 – 2020-02-21 (×3): 40 mg via INTRAVENOUS
  Filled 2020-02-20 (×3): qty 1

## 2020-02-20 MED ORDER — SODIUM CHLORIDE 1 G PO TABS
1.0000 g | ORAL_TABLET | Freq: Two times a day (BID) | ORAL | Status: DC
  Administered 2020-02-20 – 2020-02-21 (×2): 1 g via ORAL
  Filled 2020-02-20 (×3): qty 1

## 2020-02-20 MED ORDER — QUETIAPINE FUMARATE 25 MG PO TABS
25.0000 mg | ORAL_TABLET | Freq: Once | ORAL | Status: DC
  Filled 2020-02-20: qty 1

## 2020-02-20 MED ORDER — HALOPERIDOL LACTATE 5 MG/ML IJ SOLN
5.0000 mg | Freq: Two times a day (BID) | INTRAMUSCULAR | Status: DC | PRN
  Administered 2020-02-20: 5 mg via INTRAVENOUS
  Filled 2020-02-20: qty 1

## 2020-02-20 MED ORDER — BUDESONIDE 0.5 MG/2ML IN SUSP
0.5000 mg | Freq: Two times a day (BID) | RESPIRATORY_TRACT | Status: DC
  Administered 2020-02-20 – 2020-02-21 (×3): 0.5 mg via RESPIRATORY_TRACT
  Filled 2020-02-20 (×4): qty 2

## 2020-02-20 MED ORDER — QUETIAPINE FUMARATE 25 MG PO TABS
25.0000 mg | ORAL_TABLET | Freq: Every day | ORAL | Status: DC
  Administered 2020-02-20: 25 mg via ORAL
  Filled 2020-02-20: qty 1

## 2020-02-20 MED ORDER — HALOPERIDOL LACTATE 5 MG/ML IJ SOLN: 5.0000 mg | Freq: Four times a day (QID) | INTRAMUSCULAR | Status: DC | PRN

## 2020-02-20 MED FILL — Menthol Lozenge 3 MG: OROMUCOSAL | Qty: 9 | Status: AC

## 2020-02-20 NOTE — Progress Notes (Signed)
SATURATION QUALIFICATIONS: (This note is used to comply with regulatory documentation for home oxygen)  Patient Saturations on Room Air at Rest = 86%  Patient Saturations on Room Air while Ambulating = 80%  Patient Saturations on 3 Liters of oxygen while Ambulating = 92%  Please briefly explain why patient needs home oxygen: Patient COPD exacerbation SOB when not on oxygen.

## 2020-02-20 NOTE — Evaluation (Addendum)
Occupational Therapy Evaluation Patient Details Name: Gary York MRN: [id number] DOB: [birth year] Today's Date: 02/20/2020    History of Present Illness Pt is a 86 y.o. male presenting to hospital 11/26 with SOB and cough; pt with significant weakness last 24 hours; difficulty getting around his house.  Pt admitted with COPD exacerbation, hyperglycemia, and chronic systolic CHF.  PMH includes htn, HLD, COPD, CAD, CHF, pacemaker.   Clinical Impression   Gary York was seen for OT evaluation this date. Per DTR, prior to hospital admission pt had caregivers to provide set up for meals (provided by facility), meds (dtr set up weekly pill box and caregivers give to pt), transportation, and provide supervision for safety. Pt was able to perform seated shower, seated grooming, and dressing tasks without assist. Pt lives at [facility] [place] [facility] c 24/7 caregivers. Pt presents to acute OT demonstrating impaired ADL performance and functional mobility 2/2 decreased safety awareness, decreased activity tolerance, and functional strength/balance deficits.   Upon arrival to room pt found agitated, standing c RN and NT in room attempting to redirect pt back to sitting. Pt asks OT "are you an intelligent person, can you take me to the lobby?" Pt refuses to asnwer questions, appears confused and unclear he is in hospital. Pt picks up RW and pushes it into RN c fair balance stating "get out of my way." RN administered PRN Haldol and agreeable to pt mobilizing in hallway to calm down. Pt tolerates ~180 ft mobility c SBA + RW - reports fatigue and easily redirected back to recliner. Reports unable to breathe but repeatedly removing O2 - SpO2 88% on RA, RN attempting to replace, reports SpO2 97% on 2L [place]. Anticipate MIN A for bathing/LB access in sitting 2/2 shortness of breath.   Pt would benefit from skilled OT to address noted impairments and functional limitations (see below for any additional details) in order  to maximize safety and independence while minimizing falls risk and caregiver burden. Upon hospital discharge, recommend HHOT c 24/7 assistance to maximize pt safety and return to functional independence during meaningful occupations of daily life.     Follow Up Recommendations  Home health OT;Supervision/Assistance - 24 hour    Equipment Recommendations  None recommended by OT    Recommendations for Other Services       Precautions / Restrictions Precautions Precautions: Fall Restrictions Weight Bearing Restrictions: No      Mobility Bed Mobility     General bed mobility comments: pt received standing in room, left up in chair     Transfers Overall transfer level: Needs assistance Equipment used: Rolling walker (2 wheeled) Transfers: Sit to/from Stand Sit to Stand: Supervision         General transfer comment: no physical assist stand>sit    Balance Overall balance assessment: Needs assistance Sitting-balance support: No upper extremity supported;Feet supported Sitting balance-Leahy Scale: Good     Standing balance support: Bilateral upper extremity supported Standing balance-Leahy Scale: Fair Standing balance comment: Pt picks up RW and pushes it into RN c fair balance        ADL either performed or assessed with clinical judgement   ADL Overall ADL's : Needs assistance/impaired        General ADL Comments: SBA + RW for toilet t/f. Anticipate MIN A for bathing/LB access in sitting                  Pertinent Vitals/Pain Pain Assessment: Faces Faces Pain Scale: No hurt  Hand Dominance     Extremity/Trunk Assessment Upper Extremity Assessment Upper Extremity Assessment: Generalized weakness   Lower Extremity Assessment Lower Extremity Assessment: Generalized weakness       Communication Communication Communication: No difficulties   Cognition Arousal/Alertness: Awake/alert Behavior During Therapy: Agitated Overall Cognitive Status:  No family/caregiver present to determine baseline cognitive functioning        General Comments: Pt requesting to go to "lobby" refuses to asnwer questions, appears confused and unclear he is in hospital. Reports unable to breathe but repeatedly removing O2   General Comments  SpO2 88% on RA - RN attempting to place O2 on pt     Exercises Exercises: Other exercises Other Exercises Other Exercises: Pt educated re: OT role, DME recs, d/c recs, falls prevention, ECS Other Exercises: Self-feeding, stand>sit, ~180 ft mobility, sitting/standing balance/tolerance, re-direction to safe tasks   Shoulder Instructions      Home Living Family/patient expects to be discharged to:: Private residence Living Arrangements: Other (Comment) (pt reports having 24/7 caregivers) Available Help at Discharge: Family;Personal care attendant;Available 24 hours/day;Available PRN/intermittently (caregivers 24/7, dtr and son there approx 5 days/wk ) Type of Home: House (ILF cottage) Home Access: Level entry     Home Layout: Two level;Able to live on main level with bedroom/bathroom Alternate Level Stairs-Number of Steps: 3   Bathroom Shower/Tub: Producer, television/film/video: Handicapped height     Home Equipment: Shower seat - built in;Walker - 2 wheels;Cane - single point;Wheelchair - manual;Grab bars - tub/shower;Hand held shower head          Prior Functioning/Environment Level of Independence: Needs assistance  Gait / Transfers Assistance Needed: per dtr, pt was ambulating with a SPC up until approx 2 weeks ago when he felt weaker and switched to the RW ADL's / Homemaking Assistance Needed: per dtr, caregivers provide set up for meals (provided by facility), meds (dtr set up weekly pill box and caregivers give to pt), transportation, and provide supervision for safety, pt was able to perform seated shower, seated grooming, and dressing tasks without assist   Comments: Per PT eval, pt endorsing  many falls, per dtr pt had fallen once that she was aware of; has life alert pendant (doesn't wear it), cottage has call bells in bathroom        OT Problem List: Decreased activity tolerance;Impaired balance (sitting and/or standing);Decreased safety awareness;Decreased knowledge of precautions      OT Treatment/Interventions: Self-care/ADL training;Therapeutic exercise;Energy conservation;DME and/or AE instruction;Therapeutic activities;Patient/family education;Balance training    OT Goals(Current goals can be found in the care plan section) Acute Rehab OT Goals Patient Stated Goal: to improve breathing OT Goal Formulation: With patient Time For Goal Achievement: 03/05/20 Potential to Achieve Goals: Good ADL Goals Pt Will Perform Grooming: with modified independence;standing (c LRAD PRN) Pt Will Transfer to Toilet: with modified independence;ambulating;regular height toilet (c LRAD PRN) Additional ADL Goal #1: Pt will Independently verbalize plan to implement x3 ECS  OT Frequency: Min 1X/week    AM-PAC OT "6 Clicks" Daily Activity     Outcome Measure Help from another person eating meals?: None Help from another person taking care of personal grooming?: A Little Help from another person toileting, which includes using toliet, bedpan, or urinal?: A Little Help from another person bathing (including washing, rinsing, drying)?: A Little Help from another person to put on and taking off regular upper body clothing?: None Help from another person to put on and taking off regular lower body clothing?: A  Little 6 Click Score: 20   End of Session Equipment Utilized During Treatment: Rolling walker Nurse Communication: Mobility status  Activity Tolerance: Patient tolerated treatment well;Treatment limited secondary to agitation Patient left: in chair;with call bell/phone within reach;with chair alarm set;with nursing/sitter in room  OT Visit Diagnosis: Other abnormalities of gait and  mobility (R26.89)                Time: 7517-0017 OT Time Calculation (min): 11 min Charges:  OT General Charges $OT Visit: 1 Visit OT Evaluation $OT Eval Low Complexity: 1 Low OT Treatments $Self Care/Home Management : 8-22 mins  Kathie Dike, M.S. OTR/L  02/20/20, 9:58 AM  ascom [phone]

## 2020-02-20 NOTE — Progress Notes (Signed)
PROGRESS NOTE    Gary York  XHB:[id number] DOB: April 18, 1921 DOA: 02/18/2020 PCP: Marisue Ivan, MD    Assessment & Plan:   Principal Problem:   COPD exacerbation (HCC) Active Problems:   Hyperglycemia   Chronic systolic CHF (congestive heart failure) (HCC)   Aortic stenosis   Hyperlipidemia   Hyponatremia   Hypocalcemia   Normocytic anemia   Pneumonia   COPD exacerbation: continue on IV steroids, IV azithromycin & bronchodilators. Encourage incentive spirometry. Strep is neg. Legionella is pending  Pneumonia: continue on IV azithromycin, ceftriaxone and bronchodilators. Encourage incentive spirometry. Continue on supplemental oxygen and wean as tolerated  Acute hypoxic respiratory failure: desaturates to 88-86% on RA. Likely secondary to COPD. Continue on supplemental oxygen and wean as tolerated. Will likely need to be d/c home w/ O2   Acute metabolic encephalopathy: etiology unclear, delirium vs dementia vs infection induced. Agitated and confused today. IV haldol prn for agitation. Seroquel qhs   Hyperglycemia: likely secondary to steroid use. No hx of DM. Will continue to monitor   Chronic systolic CHF: continue on home dose of losartan, metoprolol   Aortic stenosis: management per cardio as an outpatient   HLD: continue on statin   Hyponatremia: continue w/ fluid restriction. Will start NaCl tabs    Hypocalcemia: will continue on tums  Normocytic anemia: H&H are stable. No need for a transfusion at this time   Transaminitis: mildly elevated. Etiology unclear. Will continue to monitor    DVT prophylaxis: lovenox Code Status: DNR Family Communication: discussed pt's care w/ pt's daughter, Lurena Joiner, via phone and answered her questions  Disposition Plan:  Depends on PT/OT recs   Status is: Observation  The patient remains OBS appropriate and will d/c before 2 midnights.  Dispo: The patient is from: Home              Anticipated d/c is to: Home w/  home health & oxygen               Anticipated d/c date is:1 or 2 days              Patient currently is not medically stable to d/c.         Consultants:      Procedures:    Antimicrobials: azithromycin, ceftriaxone   Subjective: Pt is oriented to person only.   Objective: Vitals:   02/20/20 0046 02/20/20 0300 02/20/20 0512 02/20/20 0754  BP: (!) 154/95  (!) 156/87 (!) 182/84  Pulse: 75  69 70  Resp: 20  18   Temp: (!) 97.5 F (36.4 C)  98.2 F (36.8 C) 97.8 F (36.6 C)  TempSrc:   Oral   SpO2: 98% 98% 99% 100%  Weight:      Height:        Intake/Output Summary (Last 24 hours) at 02/20/2020 1119 Last data filed at 02/20/2020 0900 Gross per 24 hour  Intake 471.89 ml  Output 335 ml  Net 136.89 ml   Filed Weights   02/18/20 1347  Weight: 91.6 kg    Examination:  General exam: Appears confused & agitated Respiratory system: decreased breath sounds b/l. No rales Cardiovascular system: S1/S2+. No clicks, rubs or gallops Gastrointestinal system: Abdomen is nondistended, soft and nontender. Normal bowel sounds heard. Central nervous system: Oriented to person only. Moves all 4 extremities Psychiatry: Judgement and insight appear abnormal. Agitated.     Data Reviewed: I have personally reviewed following labs and imaging studies  CBC: Recent Labs  Lab 02/18/20 1350 02/19/20 0452 02/20/20 0358  WBC 8.9 6.7 15.9*  HGB 11.9* 11.8* 11.2*  HCT 34.7* 33.8* 32.9*  MCV 95.6 95.5 96.2  PLT 196 221 221   Basic Metabolic Panel: Recent Labs  Lab 02/18/20 1350 02/19/20 0452 02/19/20 1711 02/20/20 0358  NA 123* 125* 123* 123*  K 3.9 4.6  --  4.7  CL 90* 93*  --  91*  CO2 24 21*  --  20*  GLUCOSE 146* 185*  --  132*  BUN 15 17  --  25*  CREATININE 1.05 1.03  --  1.08  CALCIUM 8.8* 8.8*  --  8.8*   GFR: Estimated Creatinine Clearance: 44.8 mL/min (by C-G formula based on SCr of 1.08 mg/dL). Liver Function Tests: Recent Labs  Lab 02/18/20 1744  02/19/20 0452  AST 35 79*  ALT 26 65*  ALKPHOS 89 126  BILITOT 1.2 1.3*  PROT 6.8 7.1  ALBUMIN 4.2 4.0   No results for input(s): LIPASE, AMYLASE in the last 168 hours. No results for input(s): AMMONIA in the last 168 hours. Coagulation Profile: No results for input(s): INR, PROTIME in the last 168 hours. Cardiac Enzymes: No results for input(s): CKTOTAL, CKMB, CKMBINDEX, TROPONINI in the last 168 hours. BNP (last 3 results) No results for input(s): PROBNP in the last 8760 hours. HbA1C: No results for input(s): HGBA1C in the last 72 hours. CBG: Recent Labs  Lab 02/19/20 0748 02/19/20 1209 02/19/20 1648 02/19/20 2014 02/20/20 0818  GLUCAP 178* 162* 180* 172* 158*   Lipid Profile: No results for input(s): CHOL, HDL, LDLCALC, TRIG, CHOLHDL, LDLDIRECT in the last 72 hours. Thyroid Function Tests: No results for input(s): TSH, T4TOTAL, FREET4, T3FREE, THYROIDAB in the last 72 hours. Anemia Panel: No results for input(s): VITAMINB12, FOLATE, FERRITIN, TIBC, IRON, RETICCTPCT in the last 72 hours. Sepsis Labs: Recent Labs  Lab 02/18/20 1741 02/19/20 0452 02/20/20 0358  PROCALCITON <0.10 <0.10 <0.10    Recent Results (from the past 240 hour(s))  Resp Panel by RT-PCR (Flu A&B, Covid) Nasopharyngeal Swab     Status: None   Collection Time: 02/18/20  4:40 PM   Specimen: Nasopharyngeal Swab; Nasopharyngeal(NP) swabs in vial transport medium  Result Value Ref Range Status   SARS Coronavirus 2 by RT PCR NEGATIVE NEGATIVE Final    Comment: (NOTE) SARS-CoV-2 target nucleic acids are NOT DETECTED.  The SARS-CoV-2 RNA is generally detectable in upper respiratory specimens during the acute phase of infection. The lowest concentration of SARS-CoV-2 viral copies this assay can detect is 138 copies/mL. A negative result does not preclude SARS-Cov-2 infection and should not be used as the sole basis for treatment or other patient management decisions. A negative result may occur  with  improper specimen collection/handling, submission of specimen other than nasopharyngeal swab, presence of viral mutation(s) within the areas targeted by this assay, and inadequate number of viral copies(<138 copies/mL). A negative result must be combined with clinical observations, patient history, and epidemiological information. The expected result is Negative.  Fact Sheet for Patients:  BloggerCourse.com  Fact Sheet for Healthcare Providers:  SeriousBroker.it  This test is no t yet approved or cleared by the [place] FDA and  has been authorized for detection and/or diagnosis of SARS-CoV-2 by FDA under an Emergency Use Authorization (EUA). This EUA will remain  in effect (meaning this test can be used) for the duration of the COVID-19 declaration under Section 564(b)(1) of the Act, 21 U.S.C.section 360bbb-3(b)(1), unless the authorization is terminated  or revoked sooner.       Influenza A by PCR NEGATIVE NEGATIVE Final   Influenza B by PCR NEGATIVE NEGATIVE Final    Comment: (NOTE) The Xpert Xpress SARS-CoV-2/FLU/RSV plus assay is intended as an aid in the diagnosis of influenza from Nasopharyngeal swab specimens and should not be used as a sole basis for treatment. Nasal washings and aspirates are unacceptable for Xpert Xpress SARS-CoV-2/FLU/RSV testing.  Fact Sheet for Patients: BloggerCourse.comhttps://www.fda.gov/media/152166/download  Fact Sheet for Healthcare Providers: SeriousBroker.ithttps://www.fda.gov/media/152162/download  This test is not yet approved or cleared by the [place] FDA and has been authorized for detection and/or diagnosis of SARS-CoV-2 by FDA under an Emergency Use Authorization (EUA). This EUA will remain in effect (meaning this test can be used) for the duration of the COVID-19 declaration under Section 564(b)(1) of the Act, 21 U.S.C. section 360bbb-3(b)(1), unless the authorization is terminated  or revoked.  Performed at [facility], [address] [place], [place] [address]   Blood culture (routine x 2)     Status: None (Preliminary result)   Collection Time: 02/18/20  9:23 PM   Specimen: BLOOD  Result Value Ref Range Status   Specimen Description BLOOD BLOOD LEFT HAND  Final   Special Requests   Final    BOTTLES DRAWN AEROBIC AND ANAEROBIC Blood Culture adequate volume   Culture   Final    NO GROWTH 2 DAYS Performed at [facility], [address] [place], [place] [address]    Report Status PENDING  Incomplete  Blood culture (routine x 2)     Status: None (Preliminary result)   Collection Time: 02/18/20  9:23 PM   Specimen: BLOOD  Result Value Ref Range Status   Specimen Description BLOOD BLOOD RIGHT FOREARM  Final   Special Requests   Final    BOTTLES DRAWN AEROBIC AND ANAEROBIC Blood Culture adequate volume   Culture   Final    NO GROWTH 2 DAYS Performed at [facility], [address] [place], [place] [address]    Report Status PENDING  Incomplete         Radiology Studies: DG Chest 2 View  Result Date: 02/18/2020 CLINICAL DATA:  Shortness of breath. Productive cough and wheezing. History of COPD. EXAM: CHEST - 2 VIEW COMPARISON:  06/15/2018 FINDINGS: A pacemaker remains in place with leads terminating over the right atrium and right ventricle. The cardiomediastinal silhouette is unchanged with normal heart size. Aortic atherosclerosis is noted. There is chronic elevation of the right hemidiaphragm with right basilar atelectasis. There are chronic bronchitic changes with new mild opacity in the left lung base. No overt pulmonary edema, sizable pleural effusion, or pneumothorax is identified. No acute osseous abnormality is seen. IMPRESSION: Chronic bronchitic changes with new mild left basilar opacity which may reflect pneumonia. Electronically Signed   By: Sebastian AcheAllen  Grady M.D.   On: 02/18/2020 14:21        Scheduled Meds: .  aspirin EC  81 mg Oral Daily  . atorvastatin  80 mg Oral q1800  . budesonide  0.5 mg Nebulization BID  . calcium carbonate  1 tablet Oral BID  . dextromethorphan-guaiFENesin  1 tablet Oral QHS  . enoxaparin (LOVENOX) injection  40 mg Subcutaneous Q24H  . feeding supplement  237 mL Oral BID BM  . ipratropium-albuterol  3 mL Nebulization Q6H  . isosorbide mononitrate  30 mg Oral Daily  . losartan  100 mg Oral Daily  . methylPREDNISolone (SOLU-MEDROL) injection  40 mg Intravenous  Q12H  . metoprolol succinate  25 mg Oral Daily  . pantoprazole  40 mg Oral Daily  . QUEtiapine  25 mg Oral QHS  . senna  1 tablet Oral Daily  . sodium chloride  1 g Oral BID WC   Continuous Infusions: . azithromycin Stopped (02/19/20 1828)  . cefTRIAXone (ROCEPHIN)  IV Stopped (02/19/20 1701)     LOS: 1 day    Time spent: 34 mins     Charise Killian, MD [facility] Pager [phone]  If 7PM-7AM, please contact night-coverage  02/20/2020, 11:19 AM

## 2020-02-20 NOTE — TOC Initial Note (Signed)
Transition of Care [facility]) - Initial/Assessment Note    Patient Details  Name: Gary York MRN: [id number] Date of Birth: 12-05-1921  Transition of Care [facility]) CM/SW Contact:    Delphia Grates, LCSW Phone Number: 02/20/2020, 3:18 PM  Clinical Narrative:                  Patient is a 86 year old male who admitted to the hospital due to COPD excerebration. Patient's daughter stated that he is from [facility] where he has been living for the past 5 years. He receives 24/7 care from 2 people who trade off each week and stay with him 24/7. Patient has his own room. Patient's PCP is Roni Bread, MD and he receives his medications from [facility].  DME - Oxygen through [employer] was delievered to the home via patient's daughter on 11/28.  CSW contacted [facility] and representative said to call [place] on Monday to see about admission due to his [facility] insurance and possible PT HH openings on Wednesday/Thursday.  DC - 11/29  Expected Discharge Plan: Home w Home Health Services Barriers to Discharge: Continued Medical Work up   Patient Goals and CMS Choice Patient states their goals for this hospitalization and ongoing recovery are:: "Go back to his ALF" CMS Medicare.gov Compare Post Acute Care list provided to:: Patient Represenative (must comment) (patient's daughter, Lurena Joiner) Choice offered to / list presented to : Adult Children  Expected Discharge Plan and Services Expected Discharge Plan: Home w Home Health Services       Living arrangements for the past 2 months: Assisted Living Facility [facility])                 DME Arranged: Oxygen DME Agency: [employer] Date DME Agency Contacted: 02/20/20   Representative spoke with at DME Agency: Arnold Long            Prior Living Arrangements/Services Living arrangements for the past 2 months: Assisted Living Facility Pharmacist, community) Lives with:: Facility Resident   Do you feel safe going back to the  place where you live?: Yes      Need for Family Participation in Patient Care: Yes (Comment) (patient's daughter) Care giver support system in place?: Yes (comment) (24 hour care) Current home services: DME Dan Humphreys, wheelchair, shower chair, handicap bars/toilet)    Activities of Daily Living Home Assistive Devices/Equipment: Environmental consultant (specify type) ADL Screening (condition at time of admission) Patient's cognitive ability adequate to safely complete daily activities?: No Is the patient deaf or have difficulty hearing?: No Does the patient have difficulty seeing, even when wearing glasses/contacts?: No Does the patient have difficulty concentrating, remembering, or making decisions?: Yes Patient able to express need for assistance with ADLs?: No Does the patient have difficulty dressing or bathing?: Yes Independently performs ADLs?: No Dressing (OT): Independent Grooming: Independent Feeding: Independent Bathing: Independent Toileting: Needs assistance Is this a change from baseline?: Pre-admission baseline Does the patient have difficulty walking or climbing stairs?: No Weakness of Legs: Both Weakness of Arms/Hands: Both  Permission Sought/Granted                  Emotional Assessment       Orientation: : Oriented to Self, Oriented to Place, Oriented to  Time, Oriented to Situation Alcohol / Substance Use: Not Applicable Psych Involvement: No (comment)  Admission diagnosis:  COPD exacerbation (HCC) [J44.1] Community acquired pneumonia of left lower lobe of lung [J18.9] Pneumonia [J18.9] Patient Active Problem List   Diagnosis Date  Noted  . Pneumonia 02/19/2020  . COPD exacerbation (HCC) 02/18/2020  . Hyperglycemia 02/18/2020  . Chronic systolic CHF (congestive heart failure) (HCC) 02/18/2020  . Aortic stenosis 02/18/2020  . Hyponatremia 02/18/2020  . Hypocalcemia 02/18/2020  . Normocytic anemia 02/18/2020  . Hyperlipidemia   . Pain due to onychomycosis of  toenails of both feet 01/21/2019  . CHF (congestive heart failure) (HCC) 09/16/2017  . Chest pain 09/09/2017   PCP:  Marisue Ivan, MD Pharmacy:   [facility] [place] [place] - [address] [place] [address] Phone: [phone] Fax: [phone]     Social Determinants of Health (SDOH) Interventions    Readmission Risk Interventions No flowsheet data found.

## 2020-02-20 NOTE — Progress Notes (Signed)
Patient's continues to have wheezing on bilateral lungs and has not improved throughout the night. He sat up in bed in the middle of the night and that helped the shortness of breath. RT stated that albuterol nebulizer did not seem to be helping and patient was coughing a lot throughout treatment. Patient FNP Katherina Right ordered Pulmicort nebulizer was ordered at 5:15am it was given at 7am since patient was not in distress after sitting up. Patient continues to be A&O x2, pulling O2 tubing off. Easy to reorient. Urine output was 175 ml this shift and 120 ml of water. Patient is sitting in recliner at moment. Will encourage use of spirometer.

## 2020-02-21 DIAGNOSIS — E871 Hypo-osmolality and hyponatremia: Secondary | ICD-10-CM | POA: Diagnosis not present

## 2020-02-21 DIAGNOSIS — J189 Pneumonia, unspecified organism: Secondary | ICD-10-CM | POA: Diagnosis not present

## 2020-02-21 DIAGNOSIS — J441 Chronic obstructive pulmonary disease with (acute) exacerbation: Secondary | ICD-10-CM | POA: Diagnosis not present

## 2020-02-21 LAB — CBC
HCT: 33.4 % — ABNORMAL LOW (ref 39.0–52.0)
Hemoglobin: 11.5 g/dL — ABNORMAL LOW (ref 13.0–17.0)
MCH: 33.1 pg (ref 26.0–34.0)
MCHC: 34.4 g/dL (ref 30.0–36.0)
MCV: 96.3 fL (ref 80.0–100.0)
Platelets: 191 10*3/uL (ref 150–400)
RBC: 3.47 MIL/uL — ABNORMAL LOW (ref 4.22–5.81)
RDW: 14.4 % (ref 11.5–15.5)
WBC: 15.7 10*3/uL — ABNORMAL HIGH (ref 4.0–10.5)
nRBC: 0 % (ref 0.0–0.2)

## 2020-02-21 LAB — BASIC METABOLIC PANEL
Anion gap: 9 (ref 5–15)
BUN: 30 mg/dL — ABNORMAL HIGH (ref 8–23)
CO2: 21 mmol/L — ABNORMAL LOW (ref 22–32)
Calcium: 8.5 mg/dL — ABNORMAL LOW (ref 8.9–10.3)
Chloride: 94 mmol/L — ABNORMAL LOW (ref 98–111)
Creatinine, Ser: 1 mg/dL (ref 0.61–1.24)
GFR, Estimated: >60 mL/min (ref >60)
Glucose, Bld: 151 mg/dL — ABNORMAL HIGH (ref 70–99)
Potassium: 5 mmol/L (ref 3.5–5.1)
Sodium: 124 mmol/L — ABNORMAL LOW (ref 135–145)

## 2020-02-21 LAB — LEGIONELLA PNEUMOPHILA SEROGP 1 UR AG: L. pneumophila Serogp 1 Ur Ag: NEGATIVE

## 2020-02-21 LAB — GLUCOSE, CAPILLARY
Glucose-Capillary: 129 mg/dL — ABNORMAL HIGH (ref 70–99)
Glucose-Capillary: 126 mg/dL — ABNORMAL HIGH (ref 70–99)

## 2020-02-21 MED ORDER — SODIUM CHLORIDE 1 G PO TABS: 1.0000 g | ORAL_TABLET | Freq: Two times a day (BID) | ORAL | 0 refills | Status: AC

## 2020-02-21 MED ORDER — PREDNISONE 20 MG PO TABS: 40.0000 mg | ORAL_TABLET | Freq: Every day | ORAL | 0 refills | Status: AC

## 2020-02-21 MED ORDER — IPRATROPIUM-ALBUTEROL 0.5-2.5 (3) MG/3ML IN SOLN: 3.0000 mL | Freq: Two times a day (BID) | RESPIRATORY_TRACT | Status: DC

## 2020-02-21 MED ORDER — AZITHROMYCIN 500 MG PO TABS: 500.0000 mg | ORAL_TABLET | Freq: Every day | ORAL | 0 refills | Status: AC

## 2020-02-21 MED ORDER — AMLODIPINE BESYLATE 10 MG PO TABS
10.0000 mg | ORAL_TABLET | Freq: Once | ORAL | Status: AC
  Administered 2020-02-21: 10 mg via ORAL
  Filled 2020-02-21: qty 1

## 2020-02-21 NOTE — TOC Transition Note (Signed)
Transition of Care [facility]) - CM/SW Discharge Note   Patient Details  Name: Gary York MRN: [id number] Date of Birth: Jan 13, 1922  Transition of Care [facility]) CM/SW Contact:  Margarito Liner, LCSW Phone Number: 02/21/2020, 1:11 PM   Clinical Narrative: Patient has orders to discharge back home to [facility]. [facility] has accepted home health referral and can start services on Thursday. Daughter and [facility] admissions coordinator are aware and agreeable. Oxygen is in the room, 3-in-1 has been ordered. Daughter will transport him home by car. No further concerns. CSW signing off.  Final next level of care: Home w Home Health Services Barriers to Discharge: Barriers Resolved   Patient Goals and CMS Choice Patient states their goals for this hospitalization and ongoing recovery are:: "Go back to his ALF" CMS Medicare.gov Compare Post Acute Care list provided to:: Patient Represenative (must comment) (patient's daughter, Lurena Joiner) Choice offered to / list presented to : Adult Children  Discharge Placement                Patient to be transferred to facility by: Daughter will take him home. Name of family member notified: March Rummage Patient and family notified of of transfer: 02/21/20  Discharge Plan and Services                DME Arranged: Oxygen, 3-N-1 DME Agency: [employer] Date DME Agency Contacted: 02/21/20   Representative spoke with at DME Agency: Santina Evans HH Arranged: RN, PT, OT [facility] Agency: [facility] Date [facility] Agency Contacted: 02/21/20   Representative spoke with at [facility] Agency: Christy Gentles  Social Determinants of Health (SDOH) Interventions     Readmission Risk Interventions No flowsheet data found.

## 2020-02-21 NOTE — Progress Notes (Signed)
Mobility Specialist - Progress Note   02/21/20 1300  Mobility  Activity Transferred:  Bed to chair  Level of Assistance Contact guard assist, steadying assist  Assistive Device Front wheel walker  Distance Ambulated (ft) 3 ft  Mobility Response Tolerated well  Mobility performed by Mobility specialist  $Mobility charge 1 Mobility    Pre-mobility: 117 HR, 76% SpO2 During mobility: 138 HR, 93% SpO2 Post-mobility: 123 HR, 99% SpO2   Pt was long-sitting in bed upon arrival with daughter present in room and Bode doffed (4L). VS were taken, O2 sat at 76%. Mobility tech went over PLB technique with pt. O2 bumped up to 6L d/t prolonged wait for sats to increase. Oxygen sat up to 93% within 1 min. Pt requested to get to recliner. Pt was SBA getting EOB and stood to RW with CGA. Mobility went over energy conservation methods with pt for completion of activity. Pt c/o dizziness upon standing, rest break was taken to resolve issue. Pt progressed to ambulation to recliner with VC needed for sequencing and hand placement. Mobility assisted with cord management. Overall, pt tolerated session well. Pt was left in recliner with all needs in reach and alarm set. [place] back on 4L with sats at 99% prior to exit, pt still c/o SOB. Nurse notified.    Filiberto Pinks Mobility Specialist 02/21/20, 1:56 PM

## 2020-02-21 NOTE — Progress Notes (Signed)
Mobility Specialist - Progress Note   02/21/20 1142  Mobility  Activity Contraindicated/medical hold  Mobility performed by Mobility specialist    Per discussion w/ nurse, pt has been sleeping from prior medication. States he can barely keep his eyes open. Will hold off at this time and re-attempt at a later date/time.    Aydien Majette Mobility Specialist  02/21/20, 11:45 AM

## 2020-02-21 NOTE — Discharge Summary (Signed)
Physician Discharge Summary  Gary York EAV:[id number] DOB: January 24, 1922 DOA: 02/18/2020  PCP: Marisue Ivan, MD  Admit date: 02/18/2020 Discharge date: 02/21/2020  Admitted From: home  Disposition:  Home w/ home health   Recommendations for Outpatient Follow-up:  1. Follow up with PCP within 1 week, will need a BMP to check his sodium level  Home Health: yes Equipment/Devices: 2L [place]   Discharge Condition: stable CODE STATUS: DNR Diet recommendation: Heart Healthy  Brief/Interim Summary: HPI was taken from Dr. Robb Matar: Gary York is a 86 y.o. male with medical history significant of CAD, chronic systolic CHF, complete heart block, pacemaker placement, COPD, hyperlipidemia, hypertension, aortic stenosis who is brought to the emergency department by family from home due to progressively worse dyspnea, occasionally productive cough associated with generalized weakness and fatigue.  He was treated about a month ago for COPD exacerbation with prednisone and antibiotic.  He was doing better until about a week ago, when he started having difficulty ambulating at home.  He has complained of chills, but no fever.  He denies having headache, sore throat, chest, abdominal or back pain at this time.  ED Course: Initial vital signs were temperature 97.9 F, pulse 72, respirations 23, blood pressure 173/77 mmHg and O2 sat 94% on room air.  CBC showed a white count of 8.9, hemoglobin 11.9 g/dL platelets 782.  Troponin was 30 and then 32 ng/L.  Sodium 123, potassium 3.9, chloride 90 and CO2 24 mmol/L.  Renal function was normal.  Glucose 146 and calcium 8.8 mg/dL.  LFTs were unremarkable except for an indirect bilirubin of 1.0 mg/dL.  Coronavirus 2 and influenza PCR were negative.  Chest radiograph has questionable small LLL infiltrate.  Hospital Course from Dr. Wilfred Lacy 11/27-11/29/21: Pt presented w/ shortness of breath secondary to COPD exacerbation as well pneumonia. Pt was treated w/ IV  steroids, IV abxs, bronchodilators as well as supplemental oxygen. Unfortunately, pt was not able to be weaned from supplemental oxygen so the pt was d/c home with 2L [place] as well as home health. Of note, pt has chronic hyponatremia of unknown etiology. Pt was fluid restricted and started on NaCl. Pt will f/u within 1 week w/ his PCP to get his Na level checked. Pt's daughter verbalized her understanding. Pt was d/c home w/ some po NaCl tabs.   Discharge Diagnoses:  Principal Problem:   COPD exacerbation (HCC) Active Problems:   Hyperglycemia   Chronic systolic CHF (congestive heart failure) (HCC)   Aortic stenosis   Hyperlipidemia   Hyponatremia   Hypocalcemia   Normocytic anemia   Pneumonia  COPD exacerbation: continue on IV steroids, IV azithromycin & bronchodilators. Encourage incentive spirometry. Strep is neg. Legionella is pending  Pneumonia: improving slowly. Continue on IV azithromycin, ceftriaxone and bronchodilators. Encourage incentive spirometry. Continue on supplemental oxygen and wean as tolerated  Acute hypoxic respiratory failure: desaturates to 88-86% on RA. Likely secondary to COPD. Continue on supplemental oxygen and wean as tolerated. Will be d/c home w/ 2L [place]  Acute metabolic encephalopathy: etiology unclear, likely secondary to worsening dementia vs delirium. Hopefully will improve with returning to home environment.   Hyperglycemia: likely secondary to steroid use. No hx of DM. Will continue to monitor   Chronic systolic CHF: continue on home dose of losartan, metoprolol   Aortic stenosis: management per cardio as an outpatient   HLD: continue on statin   Chronic hyponatremia: continue w/ fluid restriction. Will continue NaCl tabs    Hypocalcemia: will continue  on tums  Normocytic anemia: H&H are stable. No need for a transfusion at this time   Transaminitis: mildly elevated. Etiology unclear. Will continue to monitor   Discharge Instructions  Discharge  Instructions    Diet general   Complete by: As directed    Heart healthy diet   Discharge instructions   Complete by: As directed    F/u w/ PCP within 1 week as pt needs to have BMP done to check his sodium level   Increase activity slowly   Complete by: As directed      Allergies as of 02/21/2020   No Known Allergies     Medication List    TAKE these medications   acetaminophen 500 MG tablet Commonly known as: TYLENOL Take 500 mg by mouth every 6 (six) hours as needed for mild pain, moderate pain or fever.   albuterol (2.5 MG/3ML) 0.083% nebulizer solution Commonly known as: PROVENTIL Take 2.5 mg by nebulization every 6 (six) hours as needed for wheezing or shortness of breath.   albuterol 108 (90 Base) MCG/ACT inhaler Commonly known as: VENTOLIN HFA Inhale 2 puffs into the lungs every 6 (six) hours as needed for wheezing or shortness of breath.   aspirin 81 MG EC tablet Take 1 tablet (81 mg total) by mouth daily.   atorvastatin 80 MG tablet Commonly known as: LIPITOR Take 1 tablet (80 mg total) by mouth daily at 6 PM.   azithromycin 500 MG tablet Commonly known as: Zithromax Take 1 tablet (500 mg total) by mouth daily for 2 days. Take 1 tablet daily for 3 days.   dextromethorphan-guaiFENesin 30-600 MG 12hr tablet Commonly known as: MUCINEX DM Take 1 tablet by mouth at bedtime.   docusate sodium 100 MG capsule Commonly known as: COLACE Take 100 mg by mouth daily.   isosorbide mononitrate 30 MG 24 hr tablet Commonly known as: IMDUR Take 30 mg by mouth daily.   losartan 100 MG tablet Commonly known as: COZAAR Take 100 mg by mouth daily.   magnesium hydroxide 400 MG/5ML suspension Commonly known as: MILK OF MAGNESIA Take 30 mLs by mouth at bedtime as needed for mild constipation.   metoprolol succinate 25 MG 24 hr tablet Commonly known as: TOPROL-XL Take 25 mg by mouth daily.   nitroGLYCERIN 0.4 MG SL tablet Commonly known as: NITROSTAT Place 0.4 mg  under the tongue every 5 (five) minutes as needed for chest pain.   pantoprazole 40 MG tablet Commonly known as: PROTONIX Take 40 mg by mouth daily.   polyethylene glycol 17 g packet Commonly known as: MIRALAX / GLYCOLAX Take 17 g by mouth daily as needed. What changed: reasons to take this   predniSONE 20 MG tablet Commonly known as: Deltasone Take 2 tablets (40 mg total) by mouth daily for 5 days.   senna 8.6 MG Tabs tablet Commonly known as: SENOKOT Take 1 tablet by mouth daily.   sodium chloride 1 g tablet Take 1 tablet (1 g total) by mouth 2 (two) times daily with a meal for 7 days.   Trelegy Ellipta 100-62.5-25 MCG/INH Aepb Generic drug: Fluticasone-Umeclidin-Vilant Inhale 1 puff into the lungs daily.            Durable Medical Equipment  (From admission, onward)         Start     Ordered   02/21/20 1308  For home use only DME 3 n 1  Once        02/21/20 1307   02/20/20  1114  For home use only DME oxygen  Once       Question Answer Comment  Length of Need Lifetime   Liters per Minute 2   Frequency Continuous (stationary and portable oxygen unit needed)   Oxygen conserving device Yes   Oxygen delivery system Gas      02/20/20 1113          Follow-up Information    [facility] Follow up.   Specialty: Home Health Services Why: They will follow up with you for your home health needs. Start of care will be Thursday, December 2. Contact information: [address] [place] [address] [phone]              No Known Allergies  Consultations:     Procedures/Studies: DG Chest 2 View  Result Date: 02/18/2020 CLINICAL DATA:  Shortness of breath. Productive cough and wheezing. History of COPD. EXAM: CHEST - 2 VIEW COMPARISON:  06/15/2018 FINDINGS: A pacemaker remains in place with leads terminating over the right atrium and right ventricle. The cardiomediastinal silhouette is unchanged with normal heart size. Aortic  atherosclerosis is noted. There is chronic elevation of the right hemidiaphragm with right basilar atelectasis. There are chronic bronchitic changes with new mild opacity in the left lung base. No overt pulmonary edema, sizable pleural effusion, or pneumothorax is identified. No acute osseous abnormality is seen. IMPRESSION: Chronic bronchitic changes with new mild left basilar opacity which may reflect pneumonia. Electronically Signed   By: Sebastian Ache M.D.   On: 02/18/2020 14:21     Subjective: Pt is oriented to self only    Discharge Exam: Vitals:   02/21/20 0818 02/21/20 1153  BP: (!) 186/109 (!) 188/101  Pulse:  66  Resp:  14  Temp:  (!) 97.5 F (36.4 C)  SpO2:  95%   Vitals:   02/21/20 0636 02/21/20 0813 02/21/20 0818 02/21/20 1153  BP: (!) 191/98 (!) 155/111 (!) 186/109 (!) 188/101  Pulse: 72 68  66  Resp: 18 14  14   Temp: (!) 97.5 F (36.4 C) 97.7 F (36.5 C)  (!) 97.5 F (36.4 C)  TempSrc: Oral Oral  Oral  SpO2: 99% 91%  95%  Weight:      Height:        General: Pt is not in acute distress. Confused, oriented to person only  Cardiovascular: S1/S2 +, no rubs, no gallops Respiratory: diminished breath sounds b/l. No rales Abdominal: Soft, NT, obese, bowel sounds + Extremities: no cyanosis    The results of significant diagnostics from this hospitalization (including imaging, microbiology, ancillary and laboratory) are listed below for reference.     Microbiology: Recent Results (from the past 240 hour(s))  Resp Panel by RT-PCR (Flu A&B, Covid) Nasopharyngeal Swab     Status: None   Collection Time: 02/18/20  4:40 PM   Specimen: Nasopharyngeal Swab; Nasopharyngeal(NP) swabs in vial transport medium  Result Value Ref Range Status   SARS Coronavirus 2 by RT PCR NEGATIVE NEGATIVE Final    Comment: (NOTE) SARS-CoV-2 target nucleic acids are NOT DETECTED.  The SARS-CoV-2 RNA is generally detectable in upper respiratory specimens during the acute phase of  infection. The lowest concentration of SARS-CoV-2 viral copies this assay can detect is 138 copies/mL. A negative result does not preclude SARS-Cov-2 infection and should not be used as the sole basis for treatment or other patient management decisions. A negative result may occur with  improper specimen collection/handling, submission  of specimen other than nasopharyngeal swab, presence of viral mutation(s) within the areas targeted by this assay, and inadequate number of viral copies(<138 copies/mL). A negative result must be combined with clinical observations, patient history, and epidemiological information. The expected result is Negative.  Fact Sheet for Patients:  BloggerCourse.com  Fact Sheet for Healthcare Providers:  SeriousBroker.it  This test is no t yet approved or cleared by the [place] FDA and  has been authorized for detection and/or diagnosis of SARS-CoV-2 by FDA under an Emergency Use Authorization (EUA). This EUA will remain  in effect (meaning this test can be used) for the duration of the COVID-19 declaration under Section 564(b)(1) of the Act, 21 U.S.C.section 360bbb-3(b)(1), unless the authorization is terminated  or revoked sooner.       Influenza A by PCR NEGATIVE NEGATIVE Final   Influenza B by PCR NEGATIVE NEGATIVE Final    Comment: (NOTE) The Xpert Xpress SARS-CoV-2/FLU/RSV plus assay is intended as an aid in the diagnosis of influenza from Nasopharyngeal swab specimens and should not be used as a sole basis for treatment. Nasal washings and aspirates are unacceptable for Xpert Xpress SARS-CoV-2/FLU/RSV testing.  Fact Sheet for Patients: BloggerCourse.com  Fact Sheet for Healthcare Providers: SeriousBroker.it  This test is not yet approved or cleared by the [place] FDA and has been authorized for detection and/or diagnosis of SARS-CoV-2  by FDA under an Emergency Use Authorization (EUA). This EUA will remain in effect (meaning this test can be used) for the duration of the COVID-19 declaration under Section 564(b)(1) of the Act, 21 U.S.C. section 360bbb-3(b)(1), unless the authorization is terminated or revoked.  Performed at [facility], [address] [place], [place] [address]   Blood culture (routine x 2)     Status: None (Preliminary result)   Collection Time: 02/18/20  9:23 PM   Specimen: BLOOD  Result Value Ref Range Status   Specimen Description BLOOD BLOOD LEFT HAND  Final   Special Requests   Final    BOTTLES DRAWN AEROBIC AND ANAEROBIC Blood Culture adequate volume   Culture   Final    NO GROWTH 3 DAYS Performed at [facility], [address] [place], [place] [address]    Report Status PENDING  Incomplete  Blood culture (routine x 2)     Status: None (Preliminary result)   Collection Time: 02/18/20  9:23 PM   Specimen: BLOOD  Result Value Ref Range Status   Specimen Description BLOOD BLOOD RIGHT FOREARM  Final   Special Requests   Final    BOTTLES DRAWN AEROBIC AND ANAEROBIC Blood Culture adequate volume   Culture   Final    NO GROWTH 3 DAYS Performed at [facility], [address], [place], [place] [address]    Report Status PENDING  Incomplete     Labs: BNP (last 3 results) No results for input(s): BNP in the last 8760 hours. Basic Metabolic Panel: Recent Labs  Lab 02/18/20 1350 02/18/20 1350 02/19/20 0452 02/19/20 1711 02/20/20 0358 02/20/20 1728 02/21/20 0454  NA 123*   < > 125* 123* 123* 124* 124*  K 3.9  --  4.6  --  4.7  --  5.0  CL 90*  --  93*  --  91*  --  94*  CO2 24  --  21*  --  20*  --  21*  GLUCOSE 146*  --  185*  --  132*  --  151*  BUN 15  --  17  --  25*  --  30*  CREATININE 1.05  --  1.03  --  1.08  --  1.00  CALCIUM 8.8*  --  8.8*  --  8.8*  --  8.5*   < > = values in this interval not displayed.   Liver Function  Tests: Recent Labs  Lab 02/18/20 1744 02/19/20 0452  AST 35 79*  ALT 26 65*  ALKPHOS 89 126  BILITOT 1.2 1.3*  PROT 6.8 7.1  ALBUMIN 4.2 4.0   No results for input(s): LIPASE, AMYLASE in the last 168 hours. No results for input(s): AMMONIA in the last 168 hours. CBC: Recent Labs  Lab 02/18/20 1350 02/19/20 0452 02/20/20 0358 02/21/20 0454  WBC 8.9 6.7 15.9* 15.7*  HGB 11.9* 11.8* 11.2* 11.5*  HCT 34.7* 33.8* 32.9* 33.4*  MCV 95.6 95.5 96.2 96.3  PLT 196 221 221 191   Cardiac Enzymes: No results for input(s): CKTOTAL, CKMB, CKMBINDEX, TROPONINI in the last 168 hours. BNP: Invalid input(s): POCBNP CBG: Recent Labs  Lab 02/20/20 0818 02/20/20 1636 02/20/20 2209 02/21/20 0741 02/21/20 1206  GLUCAP 158* 140* 152* 129* 126*   D-Dimer No results for input(s): DDIMER in the last 72 hours. Hgb A1c No results for input(s): HGBA1C in the last 72 hours. Lipid Profile No results for input(s): CHOL, HDL, LDLCALC, TRIG, CHOLHDL, LDLDIRECT in the last 72 hours. Thyroid function studies No results for input(s): TSH, T4TOTAL, T3FREE, THYROIDAB in the last 72 hours.  Invalid input(s): FREET3 Anemia work up No results for input(s): VITAMINB12, FOLATE, FERRITIN, TIBC, IRON, RETICCTPCT in the last 72 hours. Urinalysis    Component Value Date/Time   COLORURINE STRAW (A) 09/03/2017 0904   APPEARANCEUR CLEAR (A) 09/03/2017 0904   LABSPEC 1.010 09/03/2017 0904   PHURINE 7.0 09/03/2017 0904   GLUCOSEU NEGATIVE 09/03/2017 0904   HGBUR NEGATIVE 09/03/2017 0904   BILIRUBINUR NEGATIVE 09/03/2017 0904   KETONESUR NEGATIVE 09/03/2017 0904   PROTEINUR NEGATIVE 09/03/2017 0904   NITRITE NEGATIVE 09/03/2017 0904   LEUKOCYTESUR NEGATIVE 09/03/2017 0904   Sepsis Labs Invalid input(s): PROCALCITONIN,  WBC,  LACTICIDVEN Microbiology Recent Results (from the past 240 hour(s))  Resp Panel by RT-PCR (Flu A&B, Covid) Nasopharyngeal Swab     Status: None   Collection Time: 02/18/20  4:40  PM   Specimen: Nasopharyngeal Swab; Nasopharyngeal(NP) swabs in vial transport medium  Result Value Ref Range Status   SARS Coronavirus 2 by RT PCR NEGATIVE NEGATIVE Final    Comment: (NOTE) SARS-CoV-2 target nucleic acids are NOT DETECTED.  The SARS-CoV-2 RNA is generally detectable in upper respiratory specimens during the acute phase of infection. The lowest concentration of SARS-CoV-2 viral copies this assay can detect is 138 copies/mL. A negative result does not preclude SARS-Cov-2 infection and should not be used as the sole basis for treatment or other patient management decisions. A negative result may occur with  improper specimen collection/handling, submission of specimen other than nasopharyngeal swab, presence of viral mutation(s) within the areas targeted by this assay, and inadequate number of viral copies(<138 copies/mL). A negative result must be combined with clinical observations, patient history, and epidemiological information. The expected result is Negative.  Fact Sheet for Patients:  BloggerCourse.comhttps://www.fda.gov/media/152166/download  Fact Sheet for Healthcare Providers:  SeriousBroker.ithttps://www.fda.gov/media/152162/download  This test is no t yet approved or cleared by the [place] FDA and  has been authorized for detection and/or diagnosis of SARS-CoV-2 by FDA under an Emergency Use Authorization (EUA). This EUA will remain  in  effect (meaning this test can be used) for the duration of the COVID-19 declaration under Section 564(b)(1) of the Act, 21 U.S.C.section 360bbb-3(b)(1), unless the authorization is terminated  or revoked sooner.       Influenza A by PCR NEGATIVE NEGATIVE Final   Influenza B by PCR NEGATIVE NEGATIVE Final    Comment: (NOTE) The Xpert Xpress SARS-CoV-2/FLU/RSV plus assay is intended as an aid in the diagnosis of influenza from Nasopharyngeal swab specimens and should not be used as a sole basis for treatment. Nasal washings and aspirates are  unacceptable for Xpert Xpress SARS-CoV-2/FLU/RSV testing.  Fact Sheet for Patients: BloggerCourse.com  Fact Sheet for Healthcare Providers: SeriousBroker.it  This test is not yet approved or cleared by the [place] FDA and has been authorized for detection and/or diagnosis of SARS-CoV-2 by FDA under an Emergency Use Authorization (EUA). This EUA will remain in effect (meaning this test can be used) for the duration of the COVID-19 declaration under Section 564(b)(1) of the Act, 21 U.S.C. section 360bbb-3(b)(1), unless the authorization is terminated or revoked.  Performed at [facility], [address] [place], [place] [address]   Blood culture (routine x 2)     Status: None (Preliminary result)   Collection Time: 02/18/20  9:23 PM   Specimen: BLOOD  Result Value Ref Range Status   Specimen Description BLOOD BLOOD LEFT HAND  Final   Special Requests   Final    BOTTLES DRAWN AEROBIC AND ANAEROBIC Blood Culture adequate volume   Culture   Final    NO GROWTH 3 DAYS Performed at [facility], [address] [place], [place] [address]    Report Status PENDING  Incomplete  Blood culture (routine x 2)     Status: None (Preliminary result)   Collection Time: 02/18/20  9:23 PM   Specimen: BLOOD  Result Value Ref Range Status   Specimen Description BLOOD BLOOD RIGHT FOREARM  Final   Special Requests   Final    BOTTLES DRAWN AEROBIC AND ANAEROBIC Blood Culture adequate volume   Culture   Final    NO GROWTH 3 DAYS Performed at [facility], [address] [place], [place] [address]    Report Status PENDING  Incomplete     Time coordinating discharge: Over 30 minutes  SIGNED:   Charise Killian, MD  [facility] 02/21/2020, 1:36 PM Pager   If 7PM-7AM, please contact night-coverage

## 2020-02-21 NOTE — TOC Progression Note (Signed)
Transition of Care [facility]) - Progression Note    Patient Details  Name: Gary York MRN: [id number] Date of Birth: 03-Oct-1921  Transition of Care [facility]) CM/SW Contact  Margarito Liner, LCSW Phone Number: 02/21/2020, 11:12 AM  Clinical Narrative:   [facility] is able to accept patient for PT, OT and RN. Start of care will not be until Thursday. CSW spoke to daughter. She is asking MD to call her to discuss if he will improve, if he needs a nursing home, if family needs to come in, etc. She was concerned about confusion yesterday afternoon. MD is aware and will speak with her today.  Expected Discharge Plan: Home w Home Health Services Barriers to Discharge: Continued Medical Work up  Expected Discharge Plan and Services Expected Discharge Plan: Home w Home Health Services       Living arrangements for the past 2 months: Assisted Living Facility [place])                 DME Arranged: Oxygen DME Agency: [employer] Date DME Agency Contacted: 02/20/20   Representative spoke with at DME Agency: Arnold Long             Social Determinants of Health (SDOH) Interventions    Readmission Risk Interventions No flowsheet data found.

## 2020-02-21 NOTE — Progress Notes (Signed)
Secure chat to Dr. Mayford Knife regarding BP of 188/101

## 2020-02-22 LAB — CULTURE, BLOOD (ROUTINE X 2)

## 2020-02-23 LAB — CULTURE, BLOOD (ROUTINE X 2)
Culture: NO GROWTH
Culture: NO GROWTH
Special Requests: ADEQUATE
Special Requests: ADEQUATE

## 2020-04-24 ENCOUNTER — Encounter
Admission: RE | Admit: 2020-04-24 | Discharge: 2020-04-24 | Disposition: A | Discharge status: Home / Self Care | Payer: Medicare Other | Source: Ambulatory Visit | Attending: Internal Medicine | Admitting: Internal Medicine

## 2020-05-06 ENCOUNTER — Emergency Department: Payer: Medicare Other

## 2020-05-06 ENCOUNTER — Inpatient Hospital Stay
Admission: EM | Admit: 2020-05-06 | Discharge: 2020-05-08 | DRG: 270 | Disposition: A | Discharge status: Skilled Nursing Facility | Payer: Medicare Other | Source: Skilled Nursing Facility | Attending: Internal Medicine | Admitting: Internal Medicine

## 2020-05-06 ENCOUNTER — Observation Stay: Payer: Medicare Other | Admitting: Certified Registered"

## 2020-05-06 ENCOUNTER — Encounter
Admission: EM | Disposition: A | Discharge status: Skilled Nursing Facility | Payer: Self-pay | Source: Skilled Nursing Facility | Attending: Internal Medicine

## 2020-05-06 ENCOUNTER — Other Ambulatory Visit: Payer: Self-pay

## 2020-05-06 DIAGNOSIS — J441 Chronic obstructive pulmonary disease with (acute) exacerbation: Secondary | ICD-10-CM

## 2020-05-06 DIAGNOSIS — E785 Hyperlipidemia, unspecified: Secondary | ICD-10-CM | POA: Diagnosis present

## 2020-05-06 DIAGNOSIS — I442 Atrioventricular block, complete: Secondary | ICD-10-CM | POA: Diagnosis present

## 2020-05-06 DIAGNOSIS — D72829 Elevated white blood cell count, unspecified: Secondary | ICD-10-CM | POA: Diagnosis present

## 2020-05-06 DIAGNOSIS — I7090 Unspecified atherosclerosis: Principal | ICD-10-CM

## 2020-05-06 DIAGNOSIS — I252 Old myocardial infarction: Secondary | ICD-10-CM

## 2020-05-06 DIAGNOSIS — Z833 Family history of diabetes mellitus: Secondary | ICD-10-CM

## 2020-05-06 DIAGNOSIS — I998 Other disorder of circulatory system: Secondary | ICD-10-CM

## 2020-05-06 DIAGNOSIS — I6521 Occlusion and stenosis of right carotid artery: Secondary | ICD-10-CM | POA: Diagnosis present

## 2020-05-06 DIAGNOSIS — I214 Non-ST elevation (NSTEMI) myocardial infarction: Secondary | ICD-10-CM | POA: Diagnosis present

## 2020-05-06 DIAGNOSIS — Z66 Do not resuscitate: Secondary | ICD-10-CM | POA: Diagnosis present

## 2020-05-06 DIAGNOSIS — R778 Other specified abnormalities of plasma proteins: Secondary | ICD-10-CM

## 2020-05-06 DIAGNOSIS — Z87891 Personal history of nicotine dependence: Secondary | ICD-10-CM

## 2020-05-06 DIAGNOSIS — Z7951 Long term (current) use of inhaled steroids: Secondary | ICD-10-CM

## 2020-05-06 DIAGNOSIS — I708 Atherosclerosis of other arteries: Secondary | ICD-10-CM | POA: Diagnosis present

## 2020-05-06 DIAGNOSIS — Z79899 Other long term (current) drug therapy: Secondary | ICD-10-CM

## 2020-05-06 DIAGNOSIS — F0390 Unspecified dementia without behavioral disturbance: Secondary | ICD-10-CM | POA: Diagnosis present

## 2020-05-06 DIAGNOSIS — I70228 Atherosclerosis of native arteries of extremities with rest pain, other extremity: Secondary | ICD-10-CM | POA: Diagnosis not present

## 2020-05-06 DIAGNOSIS — Z7982 Long term (current) use of aspirin: Secondary | ICD-10-CM

## 2020-05-06 DIAGNOSIS — I742 Embolism and thrombosis of arteries of the upper extremities: Secondary | ICD-10-CM | POA: Diagnosis not present

## 2020-05-06 DIAGNOSIS — I5031 Acute diastolic (congestive) heart failure: Secondary | ICD-10-CM

## 2020-05-06 DIAGNOSIS — D649 Anemia, unspecified: Secondary | ICD-10-CM

## 2020-05-06 DIAGNOSIS — I5022 Chronic systolic (congestive) heart failure: Secondary | ICD-10-CM | POA: Diagnosis present

## 2020-05-06 DIAGNOSIS — I110 Hypertensive heart disease with heart failure: Secondary | ICD-10-CM | POA: Diagnosis present

## 2020-05-06 DIAGNOSIS — I2510 Atherosclerotic heart disease of native coronary artery without angina pectoris: Secondary | ICD-10-CM | POA: Diagnosis present

## 2020-05-06 DIAGNOSIS — Z20822 Contact with and (suspected) exposure to covid-19: Secondary | ICD-10-CM | POA: Diagnosis present

## 2020-05-06 DIAGNOSIS — J449 Chronic obstructive pulmonary disease, unspecified: Secondary | ICD-10-CM | POA: Diagnosis present

## 2020-05-06 DIAGNOSIS — Z950 Presence of cardiac pacemaker: Secondary | ICD-10-CM

## 2020-05-06 HISTORY — PX: THROMBECTOMY BRACHIAL ARTERY: SHX6649

## 2020-05-06 LAB — CBC WITH DIFFERENTIAL/PLATELET
Abs Immature Granulocytes: 0.08 10*3/uL — ABNORMAL HIGH (ref 0.00–0.07)
Basophils Absolute: 0.1 10*3/uL (ref 0.0–0.1)
Basophils Relative: 1 %
Eosinophils Absolute: 0.2 10*3/uL (ref 0.0–0.5)
Eosinophils Relative: 1 %
HCT: 38.6 % — ABNORMAL LOW (ref 39.0–52.0)
Hemoglobin: 13.1 g/dL (ref 13.0–17.0)
Immature Granulocytes: 1 %
Lymphocytes Relative: 7 %
Lymphs Abs: 0.9 10*3/uL (ref 0.7–4.0)
MCH: 32.5 pg (ref 26.0–34.0)
MCHC: 33.9 g/dL (ref 30.0–36.0)
MCV: 95.8 fL (ref 80.0–100.0)
Monocytes Absolute: 0.9 10*3/uL (ref 0.1–1.0)
Monocytes Relative: 8 %
Neutro Abs: 10.1 10*3/uL — ABNORMAL HIGH (ref 1.7–7.7)
Neutrophils Relative %: 82 %
Platelets: 245 10*3/uL (ref 150–400)
RBC: 4.03 MIL/uL — ABNORMAL LOW (ref 4.22–5.81)
RDW: 14.4 % (ref 11.5–15.5)
WBC: 12.2 10*3/uL — ABNORMAL HIGH (ref 4.0–10.5)
nRBC: 0 % (ref 0.0–0.2)

## 2020-05-06 LAB — TYPE AND SCREEN
ABO/RH(D): A NEG
Antibody Screen: NEGATIVE

## 2020-05-06 LAB — BASIC METABOLIC PANEL
Anion gap: 12 (ref 5–15)
BUN: 17 mg/dL (ref 8–23)
CO2: 23 mmol/L (ref 22–32)
Calcium: 8.8 mg/dL — ABNORMAL LOW (ref 8.9–10.3)
Chloride: 99 mmol/L (ref 98–111)
Creatinine, Ser: 0.81 mg/dL (ref 0.61–1.24)
GFR, Estimated: >60 mL/min (ref >60)
Glucose, Bld: 233 mg/dL — ABNORMAL HIGH (ref 70–99)
Potassium: 3.5 mmol/L (ref 3.5–5.1)
Sodium: 134 mmol/L — ABNORMAL LOW (ref 135–145)

## 2020-05-06 LAB — APTT: aPTT: 30 seconds (ref 24–36)

## 2020-05-06 LAB — TROPONIN I (HIGH SENSITIVITY): Troponin I (High Sensitivity): 593 ng/L (ref <18)

## 2020-05-06 LAB — PROTIME-INR
INR: 1.4 — ABNORMAL HIGH (ref 0.8–1.2)
Prothrombin Time: 16.3 seconds — ABNORMAL HIGH (ref 11.4–15.2)

## 2020-05-06 LAB — RESP PANEL BY RT-PCR (FLU A&B, COVID) ARPGX2
Influenza A by PCR: NEGATIVE
Influenza B by PCR: NEGATIVE
SARS Coronavirus 2 by RT PCR: NEGATIVE

## 2020-05-06 LAB — TSH: TSH: 3.244 u[IU]/mL (ref 0.350–4.500)

## 2020-05-06 SURGERY — THROMBECTOMY, ARTERY, BRACHIAL
Anesthesia: Monitor Anesthesia Care | Laterality: Right

## 2020-05-06 MED ORDER — DEXMEDETOMIDINE HCL 200 MCG/2ML IV SOLN
INTRAVENOUS | Status: DC | PRN
  Administered 2020-05-06 (×2): 8 ug via INTRAVENOUS

## 2020-05-06 MED ORDER — CEFAZOLIN SODIUM-DEXTROSE 1-4 GM/50ML-% IV SOLN
INTRAVENOUS | Status: DC | PRN
  Administered 2020-05-06: 1 g via INTRAVENOUS

## 2020-05-06 MED ORDER — ONDANSETRON HCL 4 MG/2ML IJ SOLN
4.0000 mg | Freq: Once | INTRAMUSCULAR | Status: AC
  Administered 2020-05-06: 4 mg via INTRAVENOUS
  Filled 2020-05-06: qty 2

## 2020-05-06 MED ORDER — SODIUM CHLORIDE 0.9 % IV SOLN: 500.0000 mL | Freq: Once | INTRAVENOUS | Status: DC | PRN

## 2020-05-06 MED ORDER — NITROGLYCERIN 0.4 MG SL SUBL: 0.4000 mg | SUBLINGUAL_TABLET | SUBLINGUAL | Status: DC | PRN

## 2020-05-06 MED ORDER — FENTANYL CITRATE (PF) 100 MCG/2ML IJ SOLN
25.0000 ug | INTRAMUSCULAR | Status: DC | PRN
Start: 2020-05-06 — End: 2020-05-06

## 2020-05-06 MED ORDER — HEPARIN (PORCINE) 25000 UT/250ML-% IV SOLN
1000.0000 [IU]/h | INTRAVENOUS | Status: DC
  Administered 2020-05-06: 1000 [IU]/h via INTRAVENOUS
  Filled 2020-05-06: qty 250

## 2020-05-06 MED ORDER — PANTOPRAZOLE SODIUM 40 MG PO TBEC
40.0000 mg | DELAYED_RELEASE_TABLET | Freq: Every day | ORAL | Status: DC
  Administered 2020-05-07 – 2020-05-08 (×2): 40 mg via ORAL
  Filled 2020-05-06 (×2): qty 1

## 2020-05-06 MED ORDER — HYDRALAZINE HCL 20 MG/ML IJ SOLN
INTRAMUSCULAR | Status: DC | PRN
  Administered 2020-05-06 (×2): 5 mg via INTRAVENOUS

## 2020-05-06 MED ORDER — CEFAZOLIN SODIUM 1 G IJ SOLR
INTRAMUSCULAR | Status: AC
  Filled 2020-05-06: qty 10

## 2020-05-06 MED ORDER — METOPROLOL TARTRATE 5 MG/5ML IV SOLN: 2.0000 mg | INTRAVENOUS | Status: DC | PRN

## 2020-05-06 MED ORDER — ALBUTEROL SULFATE HFA 108 (90 BASE) MCG/ACT IN AERS
2.0000 | INHALATION_SPRAY | Freq: Four times a day (QID) | RESPIRATORY_TRACT | Status: DC | PRN
  Filled 2020-05-06: qty 6.7

## 2020-05-06 MED ORDER — HYDROMORPHONE HCL 1 MG/ML IJ SOLN
0.5000 mg | Freq: Once | INTRAMUSCULAR | Status: AC
  Administered 2020-05-06: 0.5 mg via INTRAVENOUS
  Filled 2020-05-06: qty 1

## 2020-05-06 MED ORDER — ALBUTEROL SULFATE (2.5 MG/3ML) 0.083% IN NEBU: 2.5000 mg | INHALATION_SOLUTION | Freq: Four times a day (QID) | RESPIRATORY_TRACT | Status: DC | PRN

## 2020-05-06 MED ORDER — OXYCODONE HCL 5 MG PO TABS: 5.0000 mg | ORAL_TABLET | ORAL | Status: DC | PRN

## 2020-05-06 MED ORDER — LABETALOL HCL 5 MG/ML IV SOLN
10.0000 mg | INTRAVENOUS | Status: DC | PRN
  Administered 2020-05-08 (×2): 10 mg via INTRAVENOUS
  Filled 2020-05-06 (×2): qty 4

## 2020-05-06 MED ORDER — MAGNESIUM SULFATE 2 GM/50ML IV SOLN: 2.0000 g | Freq: Every day | INTRAVENOUS | Status: DC | PRN

## 2020-05-06 MED ORDER — ONDANSETRON HCL 4 MG/2ML IJ SOLN: 4.0000 mg | Freq: Four times a day (QID) | INTRAMUSCULAR | Status: DC | PRN

## 2020-05-06 MED ORDER — CEFAZOLIN SODIUM-DEXTROSE 2-4 GM/100ML-% IV SOLN
2.0000 g | Freq: Three times a day (TID) | INTRAVENOUS | Status: AC
  Administered 2020-05-07 (×2): 2 g via INTRAVENOUS
  Filled 2020-05-06 (×2): qty 100

## 2020-05-06 MED ORDER — SODIUM CHLORIDE 0.9 % IV SOLN: INTRAVENOUS | Status: DC

## 2020-05-06 MED ORDER — ONDANSETRON HCL 4 MG PO TABS: 4.0000 mg | ORAL_TABLET | Freq: Four times a day (QID) | ORAL | Status: DC | PRN

## 2020-05-06 MED ORDER — FLEET ENEMA 7-19 GM/118ML RE ENEM: 1.0000 | ENEMA | Freq: Once | RECTAL | Status: DC | PRN

## 2020-05-06 MED ORDER — HYDRALAZINE HCL 20 MG/ML IJ SOLN: 5.0000 mg | INTRAMUSCULAR | Status: DC | PRN

## 2020-05-06 MED ORDER — LOSARTAN POTASSIUM 50 MG PO TABS: 100.0000 mg | ORAL_TABLET | Freq: Every day | ORAL | Status: DC

## 2020-05-06 MED ORDER — ISOSORBIDE MONONITRATE ER 60 MG PO TB24
30.0000 mg | ORAL_TABLET | Freq: Every day | ORAL | Status: DC
  Administered 2020-05-07 – 2020-05-08 (×2): 30 mg via ORAL
  Filled 2020-05-06 (×2): qty 1

## 2020-05-06 MED ORDER — GUAIFENESIN-DM 100-10 MG/5ML PO SYRP: 15.0000 mL | ORAL_SOLUTION | ORAL | Status: DC | PRN

## 2020-05-06 MED ORDER — MORPHINE SULFATE (PF) 2 MG/ML IV SOLN: 2.0000 mg | INTRAVENOUS | Status: DC | PRN

## 2020-05-06 MED ORDER — IOHEXOL 350 MG/ML SOLN
125.0000 mL | Freq: Once | INTRAVENOUS | Status: AC | PRN
  Administered 2020-05-06: 125 mL via INTRAVENOUS

## 2020-05-06 MED ORDER — PHENOL 1.4 % MT LIQD
1.0000 | OROMUCOSAL | Status: DC | PRN
  Filled 2020-05-06: qty 177

## 2020-05-06 MED ORDER — ALUM & MAG HYDROXIDE-SIMETH 200-200-20 MG/5ML PO SUSP: 15.0000 mL | ORAL | Status: DC | PRN

## 2020-05-06 MED ORDER — HYDRALAZINE HCL 20 MG/ML IJ SOLN
INTRAMUSCULAR | Status: AC
  Filled 2020-05-06: qty 1

## 2020-05-06 MED ORDER — FENTANYL CITRATE (PF) 100 MCG/2ML IJ SOLN
INTRAMUSCULAR | Status: AC
  Filled 2020-05-06: qty 2

## 2020-05-06 MED ORDER — HEPARIN BOLUS VIA INFUSION
5000.0000 [IU] | Freq: Once | INTRAVENOUS | Status: DC
  Filled 2020-05-06: qty 5000

## 2020-05-06 MED ORDER — ATORVASTATIN CALCIUM 20 MG PO TABS
80.0000 mg | ORAL_TABLET | Freq: Every day | ORAL | Status: DC
  Administered 2020-05-07: 80 mg via ORAL
  Filled 2020-05-06: qty 4

## 2020-05-06 MED ORDER — HEPARIN SODIUM (PORCINE) 10000 UNIT/ML IJ SOLN
INTRAMUSCULAR | Status: AC
  Filled 2020-05-06: qty 1

## 2020-05-06 MED ORDER — METOPROLOL SUCCINATE ER 50 MG PO TB24
25.0000 mg | ORAL_TABLET | Freq: Every day | ORAL | Status: DC
  Administered 2020-05-07 – 2020-05-08 (×2): 25 mg via ORAL
  Filled 2020-05-06 (×2): qty 1

## 2020-05-06 MED ORDER — ONDANSETRON HCL 4 MG/2ML IJ SOLN: 4.0000 mg | Freq: Once | INTRAMUSCULAR | Status: DC | PRN

## 2020-05-06 MED ORDER — LIDOCAINE HCL 1 % IJ SOLN
INTRAMUSCULAR | Status: DC | PRN
  Administered 2020-05-06: 5 mL

## 2020-05-06 MED ORDER — BUPIVACAINE HCL (PF) 0.5 % IJ SOLN
INTRAMUSCULAR | Status: DC | PRN
  Administered 2020-05-06: 5 mL

## 2020-05-06 MED ORDER — HEPARIN SODIUM (PORCINE) 5000 UNIT/ML IJ SOLN
INTRAMUSCULAR | Status: DC | PRN
  Administered 2020-05-06: 5000 [IU] via SUBCUTANEOUS

## 2020-05-06 MED ORDER — ACETAMINOPHEN 650 MG RE SUPP
325.0000 mg | Freq: Four times a day (QID) | RECTAL | Status: DC | PRN
  Filled 2020-05-06: qty 1

## 2020-05-06 MED ORDER — ASPIRIN EC 81 MG PO TBEC
81.0000 mg | DELAYED_RELEASE_TABLET | Freq: Every day | ORAL | Status: DC
  Administered 2020-05-08: 81 mg via ORAL
  Filled 2020-05-06 (×2): qty 1

## 2020-05-06 MED ORDER — HEPARIN SODIUM (PORCINE) 5000 UNIT/ML IJ SOLN
INTRAMUSCULAR | Status: AC
  Filled 2020-05-06: qty 1

## 2020-05-06 MED ORDER — BISACODYL 10 MG RE SUPP: 10.0000 mg | Freq: Every day | RECTAL | Status: DC | PRN

## 2020-05-06 MED ORDER — POTASSIUM CHLORIDE CRYS ER 20 MEQ PO TBCR: 20.0000 meq | EXTENDED_RELEASE_TABLET | Freq: Every day | ORAL | Status: DC | PRN

## 2020-05-06 MED ORDER — MIDAZOLAM HCL 2 MG/2ML IJ SOLN
INTRAMUSCULAR | Status: DC | PRN
  Administered 2020-05-06 (×2): 1 mg via INTRAVENOUS

## 2020-05-06 MED ORDER — HEPARIN (PORCINE) 25000 UT/250ML-% IV SOLN: 1300.0000 [IU]/h | INTRAVENOUS | Status: DC

## 2020-05-06 MED ORDER — SODIUM CHLORIDE (PF) 0.9 % IJ SOLN
INTRAMUSCULAR | Status: AC
  Filled 2020-05-06: qty 20

## 2020-05-06 MED ORDER — DOCUSATE SODIUM 100 MG PO CAPS
100.0000 mg | ORAL_CAPSULE | Freq: Every day | ORAL | Status: DC
  Administered 2020-05-07 – 2020-05-08 (×2): 100 mg via ORAL
  Filled 2020-05-06 (×2): qty 1

## 2020-05-06 MED ORDER — SENNOSIDES-DOCUSATE SODIUM 8.6-50 MG PO TABS: 1.0000 | ORAL_TABLET | Freq: Every evening | ORAL | Status: DC | PRN

## 2020-05-06 MED ORDER — FENTANYL CITRATE (PF) 100 MCG/2ML IJ SOLN
INTRAMUSCULAR | Status: DC | PRN
  Administered 2020-05-06 (×2): 25 ug via INTRAVENOUS
  Administered 2020-05-06: 50 ug via INTRAVENOUS

## 2020-05-06 MED ORDER — METOPROLOL TARTRATE 5 MG/5ML IV SOLN: 5.0000 mg | INTRAVENOUS | Status: DC | PRN

## 2020-05-06 MED ORDER — ACETAMINOPHEN 325 MG PO TABS: 325.0000 mg | ORAL_TABLET | Freq: Four times a day (QID) | ORAL | Status: DC | PRN

## 2020-05-06 MED ORDER — MIDAZOLAM HCL 2 MG/2ML IJ SOLN
INTRAMUSCULAR | Status: AC
  Filled 2020-05-06: qty 2

## 2020-05-06 MED ORDER — HEPARIN SODIUM (PORCINE) 1000 UNIT/ML IJ SOLN
INTRAMUSCULAR | Status: DC | PRN
  Administered 2020-05-06: 8000 [IU] via INTRAVENOUS

## 2020-05-06 MED ORDER — LACTATED RINGERS IV SOLN: INTRAVENOUS | Status: DC | PRN

## 2020-05-06 MED ORDER — FLUTICASONE-UMECLIDIN-VILANT 100-62.5-25 MCG/INH IN AEPB: 1.0000 | INHALATION_SPRAY | Freq: Every day | RESPIRATORY_TRACT | Status: DC

## 2020-05-06 MED ORDER — NITROGLYCERIN 2 % TD OINT
TOPICAL_OINTMENT | TRANSDERMAL | Status: AC
  Filled 2020-05-06: qty 1

## 2020-05-06 SURGICAL SUPPLY — 45 items
BLADE SURG SZ11 CARB STEEL (BLADE) ×2 IMPLANT
BOOT SUTURE AID YELLOW STND (SUTURE) ×2 IMPLANT
BRUSH SCRUB EZ  4% CHG (MISCELLANEOUS) ×1
BRUSH SCRUB EZ 4% CHG (MISCELLANEOUS) ×1 IMPLANT
CATH EMBOLECTOMY 2X60 (CATHETERS) ×2 IMPLANT
CATH EMBOLECTOMY 3X80 (CATHETERS) ×2 IMPLANT
CATH FOGERTY 4X80 WAS (CATHETERS) ×2 IMPLANT
COVER WAND RF STERILE (DRAPES) ×2 IMPLANT
DERMABOND ADVANCED (GAUZE/BANDAGES/DRESSINGS) ×1
DERMABOND ADVANCED .7 DNX12 (GAUZE/BANDAGES/DRESSINGS) ×1 IMPLANT
DRSG OPSITE POSTOP 4X6 (GAUZE/BANDAGES/DRESSINGS) ×2 IMPLANT
ELECT CAUTERY BLADE 6.4 (BLADE) ×2 IMPLANT
ELECT REM PT RETURN 9FT ADLT (ELECTROSURGICAL) ×2
ELECTRODE REM PT RTRN 9FT ADLT (ELECTROSURGICAL) ×1 IMPLANT
GAUZE 4X4 16PLY RFD (DISPOSABLE) ×2 IMPLANT
GLOVE SURG ENC MOIS LTX SZ7 (GLOVE) ×2 IMPLANT
GLOVE SURG UNDER LTX SZ7.5 (GLOVE) ×2 IMPLANT
GOWN STRL REUS W/ TWL LRG LVL3 (GOWN DISPOSABLE) ×1 IMPLANT
GOWN STRL REUS W/ TWL XL LVL3 (GOWN DISPOSABLE) ×1 IMPLANT
GOWN STRL REUS W/TWL LRG LVL3 (GOWN DISPOSABLE) ×1
GOWN STRL REUS W/TWL XL LVL3 (GOWN DISPOSABLE) ×1
HEMOSTAT SURGICEL 2X3 (HEMOSTASIS) ×2 IMPLANT
IV NS 500ML (IV SOLUTION) ×1
IV NS 500ML BAXH (IV SOLUTION) ×1 IMPLANT
KIT TURNOVER KIT A (KITS) ×2 IMPLANT
LOOP RED MAXI  1X406MM (MISCELLANEOUS) ×1
LOOP VESSEL MAXI 1X406 RED (MISCELLANEOUS) ×1 IMPLANT
LOOP VESSEL MINI 0.8X406 BLUE (MISCELLANEOUS) ×1 IMPLANT
LOOPS BLUE MINI 0.8X406MM (MISCELLANEOUS) ×1
MANIFOLD NEPTUNE II (INSTRUMENTS) ×2 IMPLANT
NEEDLE FILTER BLUNT 18X 1/2SAF (NEEDLE) ×1
NEEDLE FILTER BLUNT 18X1 1/2 (NEEDLE) ×1 IMPLANT
NEEDLE HYPO 25X1 1.5 SAFETY (NEEDLE) ×2 IMPLANT
PACK EXTREMITY ARMC (MISCELLANEOUS) ×2 IMPLANT
STOCKINETTE STRL 4IN 9604848 (GAUZE/BANDAGES/DRESSINGS) ×2 IMPLANT
SUT MNCRL 4-0 (SUTURE) ×1
SUT MNCRL 4-0 27XMFL (SUTURE) ×1
SUT PROLENE 6 0 BV (SUTURE) ×4 IMPLANT
SUT SILK 3 0 (SUTURE) ×2
SUT SILK 3-0 18XBRD TIE 12 (SUTURE) ×2 IMPLANT
SUT VIC AB 3-0 SH 27 (SUTURE) ×1
SUT VIC AB 3-0 SH 27X BRD (SUTURE) ×1 IMPLANT
SUTURE MNCRL 4-0 27XMF (SUTURE) ×1 IMPLANT
SYR 20ML LL LF (SYRINGE) ×2 IMPLANT
SYR 3ML LL SCALE MARK (SYRINGE) ×2 IMPLANT

## 2020-05-06 NOTE — ED Provider Notes (Addendum)
[facility] Emergency Department Provider Note  ____________________________________________   None    (approximate)  I have reviewed the triage vital signs and the nursing notes.   HISTORY  Chief Complaint Arm Pain    HPI Gary York is a 87 y.o. male with CAD, complete heart block with pacemaker who comes in for arm pain.  Around 5 PM had sudden onset of right arm pain.  Pain was severe, constant, radiating down the arm, nothing made it better, nothing made it worse.  Reports having a cold right hand without a pulse.  Denies any chest pain, shortness of breath or other symptoms.          Past Medical History:  Diagnosis Date  . CAD (coronary artery disease)   . Chronic systolic CHF (congestive heart failure) (HCC) 02/18/2020  . Complete heart block (HCC)   . COPD (chronic obstructive pulmonary disease) (HCC)   . Hyperlipidemia   . Hypertension     Patient Active Problem List   Diagnosis Date Noted  . Pneumonia 02/19/2020  . COPD exacerbation (HCC) 02/18/2020  . Hyperglycemia 02/18/2020  . Chronic systolic CHF (congestive heart failure) (HCC) 02/18/2020  . Aortic stenosis 02/18/2020  . Hyponatremia 02/18/2020  . Hypocalcemia 02/18/2020  . Normocytic anemia 02/18/2020  . Hyperlipidemia   . Pain due to onychomycosis of toenails of both feet 01/21/2019  . CHF (congestive heart failure) (HCC) 09/16/2017  . Chest pain 09/09/2017    Past Surgical History:  Procedure Laterality Date  . PACEMAKER IMPLANT      Prior to Admission medications   Medication Sig Start Date End Date Taking? Authorizing Provider  acetaminophen (TYLENOL) 500 MG tablet Take 500 mg by mouth every 6 (six) hours as needed for mild pain, moderate pain or fever.     [provider]  albuterol (PROVENTIL HFA;VENTOLIN HFA) 108 (90 Base) MCG/ACT inhaler Inhale 2 puffs into the lungs every 6 (six) hours as needed for wheezing or shortness of breath. 09/12/17    Enid Baas, MD  albuterol (PROVENTIL) (2.5 MG/3ML) 0.083% nebulizer solution Take 2.5 mg by nebulization every 6 (six) hours as needed for wheezing or shortness of breath.     [provider]  aspirin EC 81 MG EC tablet Take 1 tablet (81 mg total) by mouth daily. 09/13/17   Enid Baas, MD  atorvastatin (LIPITOR) 80 MG tablet Take 1 tablet (80 mg total) by mouth daily at 6 PM. 09/12/17   Enid Baas, MD  dextromethorphan-guaiFENesin [facility] DM) 30-600 MG 12hr tablet Take 1 tablet by mouth at bedtime.    [provider]  docusate sodium (COLACE) 100 MG capsule Take 100 mg by mouth daily.    [provider]  Fluticasone-Umeclidin-Vilant (TRELEGY ELLIPTA) 100-62.5-25 MCG/INH AEPB Inhale 1 puff into the lungs daily.    [provider]  isosorbide mononitrate (IMDUR) 30 MG 24 hr tablet Take 30 mg by mouth daily.     [provider]  losartan (COZAAR) 100 MG tablet Take 100 mg by mouth daily. 11/20/16   [provider]  magnesium hydroxide (MILK OF MAGNESIA) 400 MG/5ML suspension Take 30 mLs by mouth at bedtime as needed for mild constipation.    [provider]  metoprolol succinate (TOPROL-XL) 25 MG 24 hr tablet Take 25 mg by mouth daily.     [provider]  nitroGLYCERIN (NITROSTAT) 0.4 MG SL tablet Place 0.4 mg under the tongue every 5 (five) minutes as needed for chest pain.  08/10/18   [provider]  pantoprazole (PROTONIX) 40 MG tablet Take 40 mg by mouth daily.     [provider]  polyethylene glycol (MIRALAX / GLYCOLAX) packet Take 17 g by mouth daily as needed. Patient taking differently: Take 17 g by mouth daily as needed for mild constipation or moderate constipation.  09/18/17   Alford Highland, MD  senna (SENOKOT) 8.6 MG TABS tablet Take 1 tablet by mouth daily.    [provider]    Allergies Patient has no known allergies.  Family History  Problem Relation Age of  Onset  . Dementia Mother   . Diabetes Mother   . Dementia Father     Social History Social History   Tobacco Use  . Smoking status: Former Smoker    Packs/day: 1.00    Years: 20.00    Pack years: 20.00    Types: Cigarettes    Quit date: 1990    Years since quitting: 32.1  . Smokeless tobacco: Never Used  Substance Use Topics  . Alcohol use: Not Currently  . Drug use: Never      Review of Systems Constitutional: No fever/chills Eyes: No visual changes. ENT: No sore throat. Cardiovascular: Denies chest pain. Respiratory: Denies shortness of breath. Gastrointestinal: No abdominal pain.  No nausea, no vomiting.  No diarrhea.  No constipation. Genitourinary: Negative for dysuria. Musculoskeletal: Negative for back pain.  Right arm pain Skin: Negative for rash. Neurological: Negative for headaches, focal weakness or numbness. All other ROS negative ____________________________________________   PHYSICAL EXAM:  VITAL SIGNS: ED Triage Vitals  Enc Vitals Group     BP 05/06/20 1750 (!) 200/91     Pulse Rate 05/06/20 1750 79     Resp 05/06/20 1750 (!) 21     Temp 05/06/20 1750 (!) 96.2 F (35.7 C)     Temp Source 05/06/20 1750 Axillary     SpO2 05/06/20 1750 90 %     Weight 05/06/20 1748 176 lb 11.2 oz (80.2 kg)     Height 05/06/20 1748 5\' 9"  (1.753 m)     Head Circumference --      Peak Flow --      Pain Score 05/06/20 1747 10     Pain Loc --      Pain Edu? --      Excl. in GC? --     Constitutional: Alert and oriented. Well appearing and in no acute distress. Eyes: Conjunctivae are normal. EOMI. Head: Atraumatic. Nose: No congestion/rhinnorhea. Mouth/Throat: Mucous membranes are moist.   Neck: No stridor. Trachea Midline. FROM Cardiovascular: Normal rate, regular rhythm. Grossly normal heart sounds.  Good peripheral circulation. Respiratory: Normal respiratory effort.  No retractions. Lungs CTAB. Gastrointestinal: Soft and nontender. No distention. No  abdominal bruits.  Musculoskeletal: Cool pale right arm with no distal pulse, no right cap refill.  Doppler pulse on the right leg. Neurologic:  Normal speech and language. No gross focal neurologic deficits are appreciated.  Skin:  Skin is warm, dry and intact. No rash noted. Psychiatric: Mood and affect are normal. Speech and behavior are normal. GU: Deferred   ____________________________________________   LABS (all labs ordered are listed, but only abnormal results are displayed)  Labs Reviewed  CBC WITH DIFFERENTIAL/PLATELET - Abnormal; Notable for the following components:      Result Value   WBC 12.2 (*)    RBC 4.03 (*)    HCT 38.6 (*)    Neutro Abs 10.1 (*)  Abs Immature Granulocytes 0.08 (*)    All other components within normal limits  BASIC METABOLIC PANEL - Abnormal; Notable for the following components:   Sodium 134 (*)    Glucose, Bld 233 (*)    Calcium 8.8 (*)    All other components within normal limits  PROTIME-INR - Abnormal; Notable for the following components:   Prothrombin Time 16.3 (*)    INR 1.4 (*)    All other components within normal limits  TROPONIN I (HIGH SENSITIVITY) - Abnormal; Notable for the following components:   Troponin I (High Sensitivity) 361 (*)    All other components within normal limits  RESP PANEL BY RT-PCR (FLU A&B, COVID) ARPGX2  APTT  TYPE AND SCREEN   ____________________________________________   ED ECG REPORT I, Concha Se, the attending physician, personally viewed and interpreted this ECG.  Paced rhythm, negative scar Bosa, no signs of STEMI.  Wide-complex secondary to being paced ____________________________________________  RADIOLOGY I, Concha Se, personally viewed and evaluated these images (plain radiographs) as part of my medical decision making, as well as reviewing the written report by the radiologist.  ED MD interpretation: No widened mediastinum  Official radiology report(s): DG Chest Portable 1  View  Result Date: 05/06/2020 CLINICAL DATA:  Assess for widened mediastinum. EXAM: PORTABLE CHEST 1 VIEW COMPARISON:  02/18/2020 FINDINGS: Cardiac silhouette partly obscured by elevated hemidiaphragms. It appears mildly enlarged. No mediastinal widening. No evidence of a mediastinal or hilar mass. Low lung volumes with vascular congestion. There is additional opacity at the bases is likely due to atelectasis. Possible small effusions. No pneumothorax. Right anterior chest wall sequential pacemaker is stable. Skeletal structures grossly intact. IMPRESSION: 1. No mediastinal widening. 2. Vascular congestion without overt pulmonary edema. No evidence of pneumonia. 3. Additional lung base opacity that is likely atelectasis. Small effusions possible. Electronically Signed   By: Amie Portland M.D.   On: 05/06/2020 18:38    ____________________________________________   PROCEDURES  Procedure(s) performed (including Critical Care):  .Critical Care Performed by: Concha Se, MD Authorized by: Concha Se, MD   Critical care provider statement:    Critical care time (minutes):  45   Critical care was necessary to treat or prevent imminent or life-threatening deterioration of the following conditions: arterial occlusion requiring heparin/thrombectomy    Critical care was time spent personally by me on the following activities:  Discussions with consultants, evaluation of patient's response to treatment, examination of patient, ordering and performing treatments and interventions, ordering and review of laboratory studies, ordering and review of radiographic studies, pulse oximetry, re-evaluation of patient's condition, obtaining history from patient or surrogate and review of old charts .1-3 Lead EKG Interpretation Performed by: Concha Se, MD Authorized by: Concha Se, MD     Interpretation: normal     ECG rate:  70s    ECG rate assessment: normal     Rhythm: paced     Ectopy: none      Conduction: normal       ____________________________________________   INITIAL IMPRESSION / ASSESSMENT AND PLAN / ED COURSE  Gary York was evaluated in Emergency Department on 05/06/2020 for the symptoms described in the history of present illness. He was evaluated in the context of the global COVID-19 pandemic, which necessitated consideration that the patient might be at risk for infection with the SARS-CoV-2 virus that causes COVID-19. Institutional protocols and algorithms that pertain to the evaluation of patients at risk for COVID-19 are  in a state of rapid change based on information released by regulatory bodies including the CDC and federal and state organizations. These policies and algorithms were followed during the patient's care in the ED.    Patient is a 87 year old who comes in with sudden onset of right arm pain with no pulse.  This is most concerning for arterial clot versus dissection.  Will get CT chest and CT with runoff to further evaluate per Dr. Nelta Numbershang's recommendation.  Will get cardiac markers and EKG to evaluate for ACS.  Will get labs evaluate for Electra abnormalities, AKI.  We will give some IV Dilaudid to help with pain.  Keep patient on the cardiac monitor  D/w Dr. Imogene Burnhen from vascular surgery that recommends CT scan arm to further evaluate for clot.  Will wait on starting heparin due to wanting to rule out dissection.  Patient's kidney function came back patient was immediately sent to the CT scanner.  CT scan negative for dissection but was positive for occlusion of the right axillary artery with mild pulmonary edema.  Discussed with Dr. Imogene Burnhen who recommended me talking to the hospital team for admission for postop given patient's elevated troponin.  He plans to do embolectomy of the arm tonight.  We will start patient on heparin per vascular surgery.  Will discuss the hospital team for admission  D/w Dr. Excell Seltzerooper per anesthesia request to see if patient need  to go to the Cath Lab prior to having thrombectomy.  Dr. Excell Seltzerooper reviewed the troponin level and given patient is no chest pain does not feel like she needs a emergent cath at this time.          ____________________________________________   FINAL CLINICAL IMPRESSION(S) / ED DIAGNOSES   Final diagnoses:  Arterial occlusion  Elevated troponin      MEDICATIONS GIVEN DURING THIS VISIT:  Medications  HYDROmorphone (DILAUDID) injection 0.5 mg (0.5 mg Intravenous Given 05/06/20 1815)  ondansetron (ZOFRAN) injection 4 mg (4 mg Intravenous Given 05/06/20 1814)  iohexol (OMNIPAQUE) 350 MG/ML injection 125 mL (125 mLs Intravenous Contrast Given 05/06/20 1848)     ED Discharge Orders    None       Note:  This document was prepared using Dragon voice recognition software and may include unintentional dictation errors.   Concha SeFunke, Yoceline Bazar E, MD 05/06/20 Babette [id number]    Concha SeFunke, Inioluwa Boulay E, MD 05/06/20 2010

## 2020-05-06 NOTE — ED Notes (Signed)
X-ray at bedside

## 2020-05-06 NOTE — H&P (Signed)
History and Physical   ZURICH CARRENO MBT:[id number] DOB: [birth year] DOA: 05/06/2020  PCP: Marisue Ivan, MD  Outpatient Specialists: [facility], DR. Gwen Pounds Patient coming from: Assisted living facility, [facility]  I have personally briefly reviewed patient's old medical records in [facility] EMR.  Chief Concern: Right arm pulselessness and cyanosis  HPI: Gary York is a 87 y.o. male with medical history significant for heart failure reduced ejection fraction, hypertension, debility, advanced dementia, presents to the emergency room for chief concerns of right upper extremity pulselessness.  HPI obtained by daughter at bedside.  She reports that patient had complained of right arm pain for 3 hours prior to EMS being called.  Patient had endorsed pain arm all over, sharp, 10 out of 10, persistent.  He has never had this before.  At bedside, patient was able to tell me his name is Gary York, that he is 87 years old and initially he stated that his daughter at bedside with his wife.  Upon further questioning he corrected and said that this was his daughter.  Daughter, Ms. March Rummage, denies report of fever, cough, nausea, vomiting, diarrhea.  ROS: Unable to obtain as patient has advanced dementia and acute pain  ED Course: Discussed with ED provider, patient requiring hospitalization due to acute right axillary artery occlusion causing acute limb ischemia.  Initial vitals in the ED had temperature of 96.2 axillary, respiration rate of 21, heart rate 70-79, blood pressure elevated.  Saturating at 90 to 98% on 3 L nasal cannula.  ED provider ordered CTA chest abdomen pelvis for dissection and was read as no evidence of aneurysm, dissection, or other acute aortic pathology.  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  Proximal occlusion of the right axillary artery, with no opacification of the right upper extremity  vasculature.  Assessment/Plan  Principal Problem:   Axillary artery thrombosis, right (HCC) Active Problems:   Occlusion of artery   Leukocytosis   Internal carotid artery occlusion, right   Right upper extremity pulselessness and pain-secondary to right axillary artery occlusion appears to be acute in nature -Vascular has been consulted -Keep patient n.p.o. -heparin drip started -Patient taken to the OR for thrombectomy  Right internal carotid artery occlusion-Per discussion with radiologist appears to be chronic in nature -Patient is not having any acute neurological changes -He has baseline dementia and able to follow commands at bedside and redirectable and per daughter at bedside patient is at baseline mental status -Consistent with radiologist thought of this right internal carotid artery occlusion appearing to be chronic in nature -Discussed with anesthesiologist and recommended to maintain MAP greater than 85 during the procedure -We will continue to maintain MAP greater than 85 postprocedure -Discussed and updated with daughter and she is aware and understands the risk and benefits of the OR procedure for right upper extremity occlusion  Leukocytosis-suspect reactive -I do not suspect that patient is septic at this time and that his increase in respiration rate and mild hypothermia is due to acute limb ischemia -CBC in the a.m.  Hypertension-patient is hypertensive to the 200s SBP  Heart failure reduced ejection fraction-resumed metoprolol succinate 25 mg daily  Hyperlipidemia-atorvastatin 80 mg nightly  Chart reviewed.   DVT prophylaxis: Heparin GTT Code Status: DNR Diet: npo Family Communication: Updated daughter at bedside and over the phone Disposition Plan: Pending clinical course Consults called: Vascular Admission status: Observation to progressive cardiac  Past Medical History:  Diagnosis Date  .  CAD (coronary artery disease)   . Chronic systolic CHF  (congestive heart failure) (HCC) 02/18/2020  . Complete heart block (HCC)   . COPD (chronic obstructive pulmonary disease) (HCC)   . Hyperlipidemia   . Hypertension    Past Surgical History:  Procedure Laterality Date  . PACEMAKER IMPLANT     Social History:  reports that he quit smoking about 32 years ago. His smoking use included cigarettes. He has a 20.00 pack-year smoking history. He has never used smokeless tobacco. He reports previous alcohol use. He reports that he does not use drugs.  No Known Allergies Family History  Problem Relation Age of Onset  . Dementia Mother   . Diabetes Mother   . Dementia Father    Family history: Family history reviewed and not pertinent  Prior to Admission medications   Medication Sig Start Date End Date Taking? Authorizing Provider  acetaminophen (TYLENOL) 500 MG tablet Take 500 mg by mouth every 6 (six) hours as needed for mild pain, moderate pain or fever.     [provider]  albuterol (PROVENTIL HFA;VENTOLIN HFA) 108 (90 Base) MCG/ACT inhaler Inhale 2 puffs into the lungs every 6 (six) hours as needed for wheezing or shortness of breath. 09/12/17   Enid BaasKalisetti, Radhika, MD  albuterol (PROVENTIL) (2.5 MG/3ML) 0.083% nebulizer solution Take 2.5 mg by nebulization every 6 (six) hours as needed for wheezing or shortness of breath.     [provider]  aspirin EC 81 MG EC tablet Take 1 tablet (81 mg total) by mouth daily. 09/13/17   Enid BaasKalisetti, Radhika, MD  atorvastatin (LIPITOR) 80 MG tablet Take 1 tablet (80 mg total) by mouth daily at 6 PM. 09/12/17   Enid BaasKalisetti, Radhika, MD  dextromethorphan-guaiFENesin [facility](MUCINEX DM) 30-600 MG 12hr tablet Take 1 tablet by mouth at bedtime.    [provider]  docusate sodium (COLACE) 100 MG capsule Take 100 mg by mouth daily.    [provider]  Fluticasone-Umeclidin-Vilant (TRELEGY ELLIPTA) 100-62.5-25 MCG/INH AEPB Inhale 1 puff into the lungs daily.    [provider]   isosorbide mononitrate (IMDUR) 30 MG 24 hr tablet Take 30 mg by mouth daily.     [provider]  losartan (COZAAR) 100 MG tablet Take 100 mg by mouth daily. 11/20/16   [provider]  magnesium hydroxide (MILK OF MAGNESIA) 400 MG/5ML suspension Take 30 mLs by mouth at bedtime as needed for mild constipation.    [provider]  metoprolol succinate (TOPROL-XL) 25 MG 24 hr tablet Take 25 mg by mouth daily.     [provider]  nitroGLYCERIN (NITROSTAT) 0.4 MG SL tablet Place 0.4 mg under the tongue every 5 (five) minutes as needed for chest pain.  08/10/18   [provider]  pantoprazole (PROTONIX) 40 MG tablet Take 40 mg by mouth daily.     [provider]  polyethylene glycol (MIRALAX / GLYCOLAX) packet Take 17 g by mouth daily as needed. Patient taking differently: Take 17 g by mouth daily as needed for mild constipation or moderate constipation.  09/18/17   Alford HighlandWieting, Richard, MD  senna (SENOKOT) 8.6 MG TABS tablet Take 1 tablet by mouth daily.    [provider]   Physical Exam: Vitals:   05/06/20 1842 05/06/20 1920 05/06/20 1922 05/06/20 1930  BP:  (!) 198/92  (!) 180/110  Pulse: 70  71 70  Resp: (!) 24 14 18  (!) 27  Temp:      TempSrc:  SpO2: 97%  98% 98%  Weight:      Height:       Constitutional: appears younger than chronological age, NAD, calm, comfortable Eyes: PERRL, lids and conjunctivae normal ENMT: Mucous membranes are moist. Posterior pharynx clear of any exudate or lesions. Age-appropriate dentition.  Hearing loss is present Neck: normal, supple, no masses, no thyromegaly Respiratory: clear to auscultation bilaterally, no wheezing, no crackles. Normal respiratory effort. No accessory muscle use.  Cardiovascular: Regular rate and rhythm, no murmurs / rubs / gallops. No extremity edema. 2+ pedal pulses. No carotid bruits.  No pulse palpated or appreciated on right upper extremity. Abdomen: no tenderness, no  masses palpated, no hepatosplenomegaly. Bowel sounds positive.  Musculoskeletal: no clubbing / cyanosis. No joint deformity upper and lower extremities. Good ROM, no contractures, no atrophy. Normal muscle tone.  Skin: no rashes, lesions, ulcers.  Right upper extremity is cold and blue. Neurologic: Sensation intact. Strength 5/5 in all 4.  Psychiatric: Normal judgment and insight. Alert and oriented x 3. Normal mood.   EKG: independently reviewed, showing sinus rhythm with rate of 75, QTc 563  Chest x-ray on Admission: I personally reviewed and I agree with radiologist reading as below.  CT ANGIO UP EXTREM RIGHT W &/OR WO CONTRAST  Result Date: 05/06/2020 CLINICAL DATA:  Cold right upper extremity EXAM: CT ANGIOGRAPHY UPPER RIGHT EXTREMITY TECHNIQUE: CT angiogram of the right upper extremity was performed following the intravenous administration of iodinated contrast. Multiplanar reformats were rendered and reviewed. CONTRAST:  OMNIPAQUE IOHEXOL 350 MG/ML SOLN COMPARISON:  None. FINDINGS: There is proximal occlusion of the right axillary artery, with no contrast opacification of the right upper extremity vasculature distal to this occlusion. The osseous structures and soft tissues of the right upper extremity are unremarkable. Review of the MIP images confirms the above findings. IMPRESSION: Proximal occlusion of the right axillary artery, with no contrast opacification of the right upper extremity vasculature distal to this occlusion. Electronically Signed   By: Lauralyn Primes M.D.   On: 05/06/2020 20:18   DG Chest Portable 1 View  Result Date: 05/06/2020 CLINICAL DATA:  Assess for widened mediastinum. EXAM: PORTABLE CHEST 1 VIEW COMPARISON:  02/18/2020 FINDINGS: Cardiac silhouette partly obscured by elevated hemidiaphragms. It appears mildly enlarged. No mediastinal widening. No evidence of a mediastinal or hilar mass. Low lung volumes with vascular congestion. There is additional opacity at  the bases is likely due to atelectasis. Possible small effusions. No pneumothorax. Right anterior chest wall sequential pacemaker is stable. Skeletal structures grossly intact. IMPRESSION: 1. No mediastinal widening. 2. Vascular congestion without overt pulmonary edema. No evidence of pneumonia. 3. Additional lung base opacity that is likely atelectasis. Small effusions possible. Electronically Signed   By: Amie Portland M.D.   On: 05/06/2020 18:38   CT Angio Chest/Abd/Pel for Dissection W and/or Wo Contrast  Result Date: 05/06/2020 CLINICAL DATA:  Cold right upper extremity EXAM: CT ANGIOGRAPHY CHEST, ABDOMEN AND PELVIS TECHNIQUE: Non-contrast CT of the chest was initially obtained. Multidetector CT imaging through the chest, abdomen and pelvis was performed using the standard protocol during bolus administration of intravenous contrast. Multiplanar reconstructed images and MIPs were obtained and reviewed to evaluate the vascular anatomy. CONTRAST:  OMNIPAQUE IOHEXOL 350 MG/ML SOLN COMPARISON:  None. FINDINGS: CTA CHEST FINDINGS Cardiovascular: Preferential opacification of the thoracic aorta. No evidence of thoracic aortic aneurysm or dissection. Aortic atherosclerosis. Normal contour and caliber of the thoracic aorta. No evidence of aneurysm, dissection, or other acute aortic pathology.  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 Cardiomegaly. Three-vessel coronary artery calcifications and/or stents. No pericardial effusion. Mediastinum/Nodes: No enlarged mediastinal, hilar, or axillary lymph nodes. Thyroid gland, trachea, and esophagus demonstrate no significant findings. Lungs/Pleura: Moderate right, small left pleural effusions and associated atelectasis or consolidation. Interlobular septal thickening throughout. Musculoskeletal: No chest wall abnormality. No acute or significant osseous findings. Review of the MIP images  confirms the above findings. CTA ABDOMEN AND PELVIS FINDINGS VASCULAR Normal contour and caliber of the abdominal aorta without evidence of aneurysm, dissection, or other acute aortic pathology. Standard branching pattern of the abdominal aorta with solitary bilateral renal arteries. Atherosclerosis at the branch vessel origins without significant stenosis. Review of the MIP images confirms the above findings. NON-VASCULAR Hepatobiliary: No solid liver abnormality is seen. No gallstones, gallbladder wall thickening, or biliary dilatation. Pancreas: Unremarkable. No pancreatic ductal dilatation or surrounding inflammatory changes. Spleen: Normal in size without significant abnormality. Adrenals/Urinary Tract: Adrenal glands are unremarkable. Exophytic cysts of the right kidney. Kidneys are otherwise normal, without renal calculi, solid lesion, or hydronephrosis. Bladder is unremarkable. Stomach/Bowel: Stomach is within normal limits. Appendix appears normal. No evidence of bowel wall thickening, distention, or inflammatory changes. Lymphatic: No enlarged abdominal or pelvic lymph nodes. Reproductive: No mass or other significant abnormality. Other: No abdominal wall hernia or abnormality. No abdominopelvic ascites. Musculoskeletal: No acute or significant osseous findings. 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 Review of the MIP images confirms the above findings. CTA RIGHT UPPER EXTREMITY FINDINGS: There is proximal occlusion of the right axillary artery, with no opacification of the right upper extremity vasculature distal to this occlusion (series 5, image 72). IMPRESSION: 1. Normal contour and caliber of the thoracic and abdominal aorta without evidence of aneurysm, dissection, or other acute aortic pathology. 2. 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 3. Proximal occlusion of the right axillary artery, with no  opacification of the right upper extremity vasculature distal to this occlusion. 4. Moderate right, small left pleural effusions and associated atelectasis or consolidation. Mild pulmonary edema. 5. Cardiomegaly and coronary artery disease. Electronically Signed   By: Lauralyn Primes M.D.   On: 05/06/2020 19:40   Labs on Admission: I have personally reviewed following labs  CBC: Recent Labs  Lab 05/06/20 1751  WBC 12.2*  NEUTROABS 10.1*  HGB 13.1  HCT 38.6*  MCV 95.8  PLT 245   Basic Metabolic Panel: Recent Labs  Lab 05/06/20 1751  NA 134*  K 3.5  CL 99  CO2 23  GLUCOSE 233*  BUN 17  CREATININE 0.81  CALCIUM 8.8*   GFR: Estimated Creatinine Clearance: 53.3 mL/min (by C-G formula based on SCr of 0.81 mg/dL).  Coagulation Profile: Recent Labs  Lab 05/06/20 1751  INR 1.4*   Urine analysis:    Component Value Date/Time   COLORURINE STRAW (A) 09/03/2017 0904   APPEARANCEUR CLEAR (A) 09/03/2017 0904   LABSPEC 1.010 09/03/2017 0904   PHURINE 7.0 09/03/2017 0904   GLUCOSEU NEGATIVE 09/03/2017 0904   HGBUR NEGATIVE 09/03/2017 0904   BILIRUBINUR NEGATIVE 09/03/2017 0904   KETONESUR NEGATIVE 09/03/2017 0904   PROTEINUR NEGATIVE 09/03/2017 0904   NITRITE NEGATIVE 09/03/2017 0904   LEUKOCYTESUR NEGATIVE 09/03/2017 0904   CRITICAL CARE Performed by: Nadyne Coombes Arleny Kruger  Total critical care time: 35 minutes  Critical care time was exclusive of separately billable procedures and  treating other patients.  Critical care was necessary to treat or prevent imminent or life-threatening deterioration.  Critical limb ischemia  Critical care was time spent personally by me on the following activities: development of treatment plan with patient and/or surrogate as well as nursing, discussions with consultants, evaluation of patient's response to treatment, examination of patient, obtaining history from patient or surrogate, ordering and performing treatments and interventions, ordering and  review of laboratory studies, ordering and review of radiographic studies, pulse oximetry and re-evaluation of patient's condition.  Shaketta Rill N Scheryl Sanborn D.O. [facility]  If 7PM-7AM, please contact overnight-coverage provider If 7AM-7PM, please contact day coverage provider [email or web address]  05/06/2020, 8:55 PM

## 2020-05-06 NOTE — Transfer of Care (Signed)
Immediate Anesthesia Transfer of Care Note  Patient: Gary York  Procedure(s) Performed: THROMBECTOMY BRACHIAL ARTERY (Right )  Patient Location: PACU  Anesthesia Type:MAC  Level of Consciousness: confused  Airway & Oxygen Therapy: Patient Spontanous Breathing and Patient connected to face mask oxygen  Post-op Assessment: Report given to RN and Post -op Vital signs reviewed and stable  Post vital signs: Reviewed and stable  Last Vitals:  Vitals Value Taken Time  BP 156/59 05/06/20 2200  Temp 36.3 C 05/06/20 2200  Pulse 74 05/06/20 2202  Resp 15 05/06/20 2202  SpO2 100 % 05/06/20 2202  Vitals shown include unvalidated device data.  Last Pain:  Vitals:   05/06/20 1922  TempSrc:   PainSc: 10-Worst pain ever         Complications: No complications documented.

## 2020-05-06 NOTE — Anesthesia Postprocedure Evaluation (Signed)
Anesthesia Post Note  Patient: Gary York  Procedure(s) Performed: THROMBECTOMY BRACHIAL ARTERY (Right )  Patient location during evaluation: PACU Anesthesia Type: MAC Level of consciousness: awake and alert Pain management: pain level controlled Vital Signs Assessment: post-procedure vital signs reviewed and stable Respiratory status: spontaneous breathing and respiratory function stable Cardiovascular status: stable Anesthetic complications: no   No complications documented.   Last Vitals:  Vitals:   05/06/20 1930 05/06/20 2200  BP: (!) 180/110 (!) 156/59  Pulse: 70 75  Resp: (!) 27 11  Temp:  (!) 36.3 C  SpO2: 98% 91%    Last Pain:  Vitals:   05/06/20 2200  TempSrc:   PainSc: Asleep                 Jo-Ann Johanning K

## 2020-05-06 NOTE — Consult Note (Signed)
VASCULAR SURGERY CONSULT  Requested by:  Artis Delay [facility] ED)  Reason for consultation: right arm ischemia    History of Present Illness   Gary York is a 87 y.o. (1921/09/24) male nursing home resident who presents with cc: right arm pain.  Pt doesn't speak much so history is obtained from patient's daughter.  Reportedly pt was complaining of pain in right arm for a few hours, leading transfer to ED.  Unknown if pt has any known history of atrial fibrillation but does have a known history of heart block requiring R SCV PPM.     Past Medical History:  Diagnosis Date  . CAD (coronary artery disease)   . Chronic systolic CHF (congestive heart failure) (HCC) 02/18/2020  . Complete heart block (HCC)   . COPD (chronic obstructive pulmonary disease) (HCC)   . Hyperlipidemia   . Hypertension     Past Surgical History:  Procedure Laterality Date  . PACEMAKER IMPLANT       Social History   Socioeconomic History  . Marital status: Married    Spouse name: Not on file  . Number of children: Not on file  . Years of education: Not on file  . Highest education level: Not on file  Occupational History  . Not on file  Tobacco Use  . Smoking status: Former Smoker    Packs/day: 1.00    Years: 20.00    Pack years: 20.00    Types: Cigarettes    Quit date: 1990    Years since quitting: 32.1  . Smokeless tobacco: Never Used  Substance and Sexual Activity  . Alcohol use: Not Currently  . Drug use: Never  . Sexual activity: Not on file  Other Topics Concern  . Not on file  Social History Narrative  . Not on file   Social Determinants of Health   Financial Resource Strain: Not on file  Food Insecurity: Not on file  Transportation Needs: Not on file  Physical Activity: Not on file  Stress: Not on file  Social Connections: Not on file  Intimate Partner Violence: Not on file    Family History  Problem Relation Age of Onset  . Dementia Mother   . Diabetes Mother   . Dementia  Father     No current facility-administered medications for this encounter.   Current Outpatient Medications  Medication Sig Dispense Refill  . acetaminophen (TYLENOL) 500 MG tablet Take 500 mg by mouth every 6 (six) hours as needed for mild pain, moderate pain or fever.     Marland Kitchen albuterol (PROVENTIL HFA;VENTOLIN HFA) 108 (90 Base) MCG/ACT inhaler Inhale 2 puffs into the lungs every 6 (six) hours as needed for wheezing or shortness of breath. 1 Inhaler 2  . albuterol (PROVENTIL) (2.5 MG/3ML) 0.083% nebulizer solution Take 2.5 mg by nebulization every 6 (six) hours as needed for wheezing or shortness of breath.     Marland Kitchen aspirin EC 81 MG EC tablet Take 1 tablet (81 mg total) by mouth daily. 30 tablet 3  . atorvastatin (LIPITOR) 80 MG tablet Take 1 tablet (80 mg total) by mouth daily at 6 PM. 30 tablet 3  . dextromethorphan-guaiFENesin (MUCINEX DM) 30-600 MG 12hr tablet Take 1 tablet by mouth at bedtime.    . docusate sodium (COLACE) 100 MG capsule Take 100 mg by mouth daily.    . Fluticasone-Umeclidin-Vilant (TRELEGY ELLIPTA) 100-62.5-25 MCG/INH AEPB Inhale 1 puff into the lungs daily.    . isosorbide mononitrate (IMDUR) 30 MG 24 hr  tablet Take 30 mg by mouth daily.     Marland Kitchen. losartan (COZAAR) 100 MG tablet Take 100 mg by mouth daily.    . magnesium hydroxide (MILK OF MAGNESIA) 400 MG/5ML suspension Take 30 mLs by mouth at bedtime as needed for mild constipation.    . metoprolol succinate (TOPROL-XL) 25 MG 24 hr tablet Take 25 mg by mouth daily.     . nitroGLYCERIN (NITROSTAT) 0.4 MG SL tablet Place 0.4 mg under the tongue every 5 (five) minutes as needed for chest pain.     . pantoprazole (PROTONIX) 40 MG tablet Take 40 mg by mouth daily.     . polyethylene glycol (MIRALAX / GLYCOLAX) packet Take 17 g by mouth daily as needed. (Patient taking differently: Take 17 g by mouth daily as needed for mild constipation or moderate constipation. ) 14 each 0  . senna (SENOKOT) 8.6 MG TABS tablet Take 1 tablet by  mouth daily.      No Known Allergies  REVIEW OF SYSTEMS (negative unless checked):   Cardiac:  []  Chest pain or chest pressure? []  Shortness of breath upon activity? []  Shortness of breath when lying flat? []  Irregular heart rhythm?  Vascular:  []  Pain in calf, thigh, or hip brought on by walking? []  Pain in feet at night that wakes you up from your sleep? []  Blood clot in your veins? []  Leg swelling?  Pulmonary:  []  Oxygen at home? []  Productive cough? []  Wheezing?  Neurologic:  [x]  Sudden weakness in arms or legs? [x]  Sudden numbness in arms or legs? []  Sudden onset of difficult speaking or slurred speech? []  Temporary loss of vision in one eye? []  Problems with dizziness?  Gastrointestinal:  []  Blood in stool? []  Vomited blood?  Genitourinary:  []  Burning when urinating? []  Blood in urine?  Psychiatric:  []  Major depression  Hematologic:  []  Bleeding problems? []  Problems with blood clotting?  Dermatologic:  []  Rashes or ulcers?  Constitutional:  []  Fever or chills?  Ear/Nose/Throat:  []  Change in hearing? []  Nose bleeds? []  Sore throat?  Musculoskeletal:  []  Back pain? []  Joint pain? []  Muscle pain?   Physical Examination     Vitals:   05/06/20 1835 05/06/20 1842 05/06/20 1920 05/06/20 1922  BP: (!) 197/96  (!) 198/92   Pulse: 70 70  71  Resp: (!) 29 (!) 24 14 18   Temp:      TempSrc:      SpO2: 94% 97%  98%  Weight:      Height:       Body mass index is 26.09 kg/m.  General Alert, Elderly, no apparent distress, minimally verbal  Head [place]/AT,    Ear/Nose/ Throat Hearing grossly intact, nares without erythema or drainage, oropharynx masked  Eyes PERRLA, EOMI,    Neck Supple, mid-line trachea,    Pulmonary Sym exp, distal BS, faint rales throughout,   Cardiac RRR, Nl S1, S2, no Murmurs, No rubs, No S3,S4, monitor: regular irregular rhythm  Vascular Vessel Right Left  Radial Not palpable Palpable  Brachial Not palpable Palpable   Carotid No pulse Palpable, No Bruit  Aorta Not palpable N/A  Femoral Palpable Palpable  Popliteal Not palpable Not palpable  PT Not palpable Faintly palpable  DP Faintly palpable Faintly palpable    Gastro- intestinal soft, non-distended, non-tender to palpation, No guarding or rebound, no HSM, no masses, no CVAT B, No palpable prominent aortic pulse,    Musculo- skeletal R arm appears ischemic, warm down to  elbow, cool distal, moves upper arm but not forearm or right hand  Neurologic Cranial nerves grossly intact, sensation decreased in right hand, Motor exam as listed above  Psychiatric Cannot be tested due to limited verbalization  Dermatologic See M/S exam for extremity exam, No rashes otherwise noted  Lymphatic  Palpable lymph nodes: None    Laboratory   CBC CBC Latest Ref Rng & Units 05/06/2020 02/21/2020 02/20/2020  WBC 4.0 - 10.5 K/uL 12.2(H) 15.7(H) 15.9(H)  Hemoglobin 13.0 - 17.0 g/dL 62.8 11.5(L) 11.2(L)  Hematocrit 39.0 - 52.0 % 38.6(L) 33.4(L) 32.9(L)  Platelets 150 - 400 K/uL 245 191 221    BMP BMP Latest Ref Rng & Units 05/06/2020 02/21/2020 02/20/2020  Glucose 70 - 99 mg/dL 315(V) 761(Y) -  BUN 8 - 23 mg/dL 17 07(P) -  Creatinine 0.61 - 1.24 mg/dL 7.10 6.26 -  Sodium 948 - 145 mmol/L 134(L) 124(L) 124(L)  Potassium 3.5 - 5.1 mmol/L 3.5 5.0 -  Chloride 98 - 111 mmol/L 99 94(L) -  CO2 22 - 32 mmol/L 23 21(L) -  Calcium 8.9 - 10.3 mg/dL 5.4(O) 2.7(O) -    Coagulation Lab Results  Component Value Date   INR 1.4 (H) 05/06/2020   INR 1.0 08/26/2013   No results found for: PTT  Lipids    Component Value Date/Time   CHOL 67 06/12/2018 0434   TRIG 29 06/12/2018 0434   HDL 36 (L) 06/12/2018 0434   CHOLHDL 1.9 06/12/2018 0434   VLDL 6 06/12/2018 0434   LDLCALC 25 06/12/2018 0434   COVID (05/06/20): negative  Troponin: 361 ng/L  Radiology     DG Chest Portable 1 View  Result Date: 05/06/2020 CLINICAL DATA:  Assess for widened mediastinum. EXAM:  PORTABLE CHEST 1 VIEW COMPARISON:  02/18/2020 FINDINGS: Cardiac silhouette partly obscured by elevated hemidiaphragms. It appears mildly enlarged. No mediastinal widening. No evidence of a mediastinal or hilar mass. Low lung volumes with vascular congestion. There is additional opacity at the bases is likely due to atelectasis. Possible small effusions. No pneumothorax. Right anterior chest wall sequential pacemaker is stable. Skeletal structures grossly intact. IMPRESSION: 1. No mediastinal widening. 2. Vascular congestion without overt pulmonary edema. No evidence of pneumonia. 3. Additional lung base opacity that is likely atelectasis. Small effusions possible. Electronically Signed   By: Amie Portland M.D.   On: 05/06/2020 18:38   CT Angio Chest/Abd/Pel for Dissection W and/or Wo Contrast  Result Date: 05/06/2020 CLINICAL DATA:  Cold right upper extremity EXAM: CT ANGIOGRAPHY CHEST, ABDOMEN AND PELVIS TECHNIQUE: Non-contrast CT of the chest was initially obtained. Multidetector CT imaging through the chest, abdomen and pelvis was performed using the standard protocol during bolus administration of intravenous contrast. Multiplanar reconstructed images and MIPs were obtained and reviewed to evaluate the vascular anatomy. CONTRAST:  OMNIPAQUE IOHEXOL 350 MG/ML SOLN COMPARISON:  None. FINDINGS: CTA CHEST FINDINGS Cardiovascular: Preferential opacification of the thoracic aorta. No evidence of thoracic aortic aneurysm or dissection. Aortic atherosclerosis. Normal contour and caliber of the thoracic aorta. No evidence of aneurysm, dissection, or other acute aortic pathology. 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 Cardiomegaly. Three-vessel coronary artery calcifications and/or stents. No pericardial effusion. Mediastinum/Nodes: No enlarged mediastinal, hilar, or axillary lymph nodes. Thyroid gland, trachea, and esophagus  demonstrate no significant findings. Lungs/Pleura: Moderate right, small left pleural effusions and associated atelectasis or consolidation. Interlobular septal thickening throughout. Musculoskeletal: No chest wall abnormality. No acute  or significant osseous findings. Review of the MIP images confirms the above findings. CTA ABDOMEN AND PELVIS FINDINGS VASCULAR Normal contour and caliber of the abdominal aorta without evidence of aneurysm, dissection, or other acute aortic pathology. Standard branching pattern of the abdominal aorta with solitary bilateral renal arteries. Atherosclerosis at the branch vessel origins without significant stenosis. Review of the MIP images confirms the above findings. NON-VASCULAR Hepatobiliary: No solid liver abnormality is seen. No gallstones, gallbladder wall thickening, or biliary dilatation. Pancreas: Unremarkable. No pancreatic ductal dilatation or surrounding inflammatory changes. Spleen: Normal in size without significant abnormality. Adrenals/Urinary Tract: Adrenal glands are unremarkable. Exophytic cysts of the right kidney. Kidneys are otherwise normal, without renal calculi, solid lesion, or hydronephrosis. Bladder is unremarkable. Stomach/Bowel: Stomach is within normal limits. Appendix appears normal. No evidence of bowel wall thickening, distention, or inflammatory changes. Lymphatic: No enlarged abdominal or pelvic lymph nodes. Reproductive: No mass or other significant abnormality. Other: No abdominal wall hernia or abnormality. No abdominopelvic ascites. Musculoskeletal: No acute or significant osseous findings. 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 Review of the MIP images confirms the above findings. CTA RIGHT UPPER EXTREMITY FINDINGS: There is proximal occlusion of the right axillary artery, with no opacification of the right upper extremity vasculature distal to this occlusion (series 5, image 72). IMPRESSION: 1.  Normal contour and caliber of the thoracic and abdominal aorta without evidence of aneurysm, dissection, or other acute aortic pathology. 2. 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 3. Proximal occlusion of the right axillary artery, with no opacification of the right upper extremity vasculature distal to this occlusion. 4. Moderate right, small left pleural effusions and associated atelectasis or consolidation. Mild pulmonary edema. 5. Cardiomegaly and coronary artery disease. Electronically Signed   By: Lauralyn Primes M.D.   On: 05/06/2020 19:40     Medical Decision Making   Gary York is a 87 y.o. 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  Pt is relatively asx from a cardiac viewpoint, but the troponin is elevated.  Further Cardiac work-up will be needed.  Due to this patient's co-morbidities, I would admit him to the Hospitalist service post-op.  Thank you for allowing [place] to participate in this patient's care.   Leonides Sake, MD, FACS, FSVS  05/06/2020, 7:39 PM

## 2020-05-06 NOTE — ED Notes (Signed)
Pt desatted after dilaudid. Reports being sleepy. Placed on [place] at this time.

## 2020-05-06 NOTE — ED Triage Notes (Addendum)
Pt arrives to ER via [facility] from [facility]. discoloration and no function to R wrist/hand x 40 minutes. no cap refill. no pulses felt. arrives with 16 L FA. no hx blood thinners. denies CP.   Angela RN used doppler to pt R arm. No pulses heard to brachial either.

## 2020-05-06 NOTE — ED Notes (Signed)
Pt taken to CT.

## 2020-05-06 NOTE — ED Notes (Signed)
Report given to [facility] OR [facility] fundraiser. Dauhgter remains at bedside. Pt belongings all placed in bag and given to daughter. Pt gold ring placed in med cup and daughter placed in her purse

## 2020-05-06 NOTE — Anesthesia Preprocedure Evaluation (Signed)
Anesthesia Evaluation  Patient identified by MRN, date of birth, ID band Patient awake and Patient confused    Reviewed: Allergy & Precautions, NPO status , Patient's Chart, lab work & pertinent test results  History of Anesthesia Complications Negative for: history of anesthetic complications  Airway Mallampati: III       Dental  (+) Missing, Chipped, Poor Dentition, Loose   Pulmonary neg sleep apnea, COPD,  COPD inhaler and oxygen dependent, Not current smoker, former smoker,           Cardiovascular hypertension, Pt. on medications + Past MI, + Peripheral Vascular Disease and +CHF  + dysrhythmias (3rd degree heart block) + pacemaker      Neuro/Psych neg Seizures    GI/Hepatic Neg liver ROS, neg GERD  ,  Endo/Other  neg diabetes  Renal/GU negative Renal ROS     Musculoskeletal   Abdominal   Peds  Hematology  (+) anemia ,   Anesthesia Other Findings   Reproductive/Obstetrics                             Anesthesia Physical Anesthesia Plan  ASA: IV and emergent  Anesthesia Plan: MAC and General   Post-op Pain Management:    Induction: Intravenous  PONV Risk Score and Plan: TIVA and Treatment may vary due to age or medical condition  Airway Management Planned: Nasal Cannula  Additional Equipment:   Intra-op Plan:   Post-operative Plan:   Informed Consent: I have reviewed the patients History and Physical, chart, labs and discussed the procedure including the risks, benefits and alternatives for the proposed anesthesia with the patient or authorized representative who has indicated his/her understanding and acceptance.       Plan Discussed with:   Anesthesia Plan Comments:         Anesthesia Quick Evaluation

## 2020-05-06 NOTE — Consult Note (Signed)
ANTICOAGULATION CONSULT NOTE   Pharmacy Consult for heparin Indication: Right SCA occlusion No Known Allergies  Patient Measurements: Height: 5\' 9"  (175.3 cm) Weight: 80.2 kg (176 lb 11.2 oz) IBW/kg (Calculated) : 70.7 Heparin Dosing Weight: 80.2  Vital Signs: Temp: 96.2 F (35.7 C) (02/12 1750) Temp Source: Axillary (02/12 1750) BP: 198/92 (02/12 1920) Pulse Rate: 71 (02/12 1922)  Labs: Recent Labs    05/06/20 1751  HGB 13.1  HCT 38.6*  PLT 245  APTT 30  LABPROT 16.3*  INR 1.4*  CREATININE 0.81  TROPONINIHS 361*    Estimated Creatinine Clearance: 53.3 mL/min (by C-G formula based on SCr of 0.81 mg/dL).   Medications:  No PTA APT or AC No AC allergies  Assessment: 87 y.o. male who presents with complaint of right arm pain. Found to have right arm ischemia due to R SCA occlusion, elevated troponin, and incidentally found R ICA occlusion. H/H and plts WNL Heparin Dosing Weight: 80.2  Goal of Therapy:  Heparin level 0.3-0.7 units/ml Monitor platelets by anticoagulation protocol: Yes   Plan:  Give 5000 units bolus x 1 Start heparin infusion at 1300 units/hr Check anti-Xa level in 8 hours and daily while on heparin Continue to monitor H&H and platelets  94 05/06/2020,8:04 PM

## 2020-05-06 NOTE — Consult Note (Signed)
ANTICOAGULATION CONSULT NOTE   Pharmacy Consult for heparin Indication: Right SCA occlusion No Known Allergies  Patient Measurements: Height: 5\' 9"  (175.3 cm) Weight: 80.2 kg (176 lb 11.2 oz) IBW/kg (Calculated) : 70.7 Heparin Dosing Weight: 80.2  Vital Signs: Temp: 97.3 F (36.3 C) (02/12 2200) Temp Source: Axillary (02/12 1750) BP: 125/72 (02/12 2215) Pulse Rate: 74 (02/12 2215)  Labs: Recent Labs    05/06/20 1751  HGB 13.1  HCT 38.6*  PLT 245  APTT 30  LABPROT 16.3*  INR 1.4*  CREATININE 0.81  TROPONINIHS 361*    Estimated Creatinine Clearance: 53.3 mL/min (by C-G formula based on SCr of 0.81 mg/dL).   Medications:  No PTA APT or AC No AC allergies  Assessment: 87 y.o. male who presents with complaint of right arm pain. Found to have right arm ischemia due to R SCA occlusion, elevated troponin, and incidentally found R ICA occlusion. H/H and plts WNL Heparin Dosing Weight: 80.2  02/12 2220 Received call from PACU RN.  Surgeon wants to proceed with starting heparin drip w/o bolus and dose drip rate using ACS/STEMI protocol.    Goal of Therapy:  Heparin level 0.3-0.7 units/ml Monitor platelets by anticoagulation protocol: Yes   Plan: Discontinue previous orders. Ordered Heparin infusion to start at 1000 unit/hr. Reorder HL for 8 hours after heparin infusion start Monitor H&H and Plts.  2221, PharmD, [facility] 05/06/2020 10:24 PM

## 2020-05-06 NOTE — Op Note (Signed)
BRIEF OPERATIVE NOTE   PROCEDURE: Right subclavian artery thromboembolectomy  PRE-OPERATIVE DIAGNOSIS: left arm ischemia  POST-OPERATIVE DIAGNOSIS: same as above  SURGEON: Adele Barthel, MD  ANESTHESIA: local and MAC  ESTIMATED BLOOD LOSS: 50 cc  FINDING: 1. Sub-acute thrombus with some organization: suspicious for embolism 2. Trickle flow proximal and obvious collateral flow from distal  3. Palpable brachial and radial pulses at end of case with dopplerable ulnar  SPECIMEN(S):  Thromboembolism from right arm  INDICATIONS: Gary York is a 87 y/o male who presents with acute right arm ischemia in the setting of elevated troponin.  The patient already has motor loss in right hand, along with CTA right arm demonstrating distal right subclavian artery occlusion.  I recommended: emergent right arm thrombectomy.  Risks include but are not limited to: bleeding, infection, nerve injury, limb loss in unable to revascularize the right arm, need for additional procedures, myocardial infarction, stroke, and death.  The patient is aware of the risks.  His daughter gave consent to proceed.  Patient also gave verbal consent.  DESCRIPTION: This patient was brought back to the operating room and placed supine upon the operating table.  Monitoring equipment was applied.  Anesthesia then gave patient IV sedation.  The patient was then prepped and draped in the usual fashion for a right arm access procedure.   I identified the brachial artery bifurcation into ulnar and radial artery.  This was roughly at the level of the proximal forearm extending into the antecubitum.  I injected 10 cc of 1% lidocaine without epinephrine to obtain local anesthesia.  I made an incision over the brachial artery.  I dissected down to the fascia and opened it with electrocautery.  I easily identified the brachial artery.  I had to dissect one brachial vein off the artery.  I placed a vessel loop around the brachial artery.     The patient was given 8000 units of Heparin which was a therapeutic bolus.  I made a transverse arteriotomy in the brachial artery proximal to the bifurcation.  There was active bleeding from the distal end of this artery, suggesting continued collateral flow.  Also there was a trickle of flow from the proximal end.  I clamped the distal end of the brachial artery.  I passed a 3-Fr Fogarty proximally.  With each pass, I got some sub-acute thrombus and was able to progressively advance the catheter more proximally.  After 4 passes, I pulled the plug out the distal subclavian artery.  A big plug of sub-acute clot with chronic clot was ejected.  This restored the brachial pulse. I clamped the brachial artery proximally and passed the clot off the field as specimen.    I then passed the 3-Fr Fogarty distally, only getting 10 cm.  No thrombus was obtained.  I had another similar attempt at distal thrombectomy.  I passed a 2-Fr Fogarty distally, similarly hanging up at 10 cm.  I also got no thrombus.  At this point, there was reasonable backbleeding distally and pulsatile bleeding proximally.  I clamped both ends of the brachial artery.  I washed out the lumen with heparinized saline.  I repaired this arteriotomy with two running stitches of 6-0 Prolene tied together.    I washed out the wound and packed it with Surgicel.  I injected 5 cc of 0.5% Marcaine into the skin for additional anesthesia.  After waiting a few minutes, I repaired the incision with a running stitch of 3-0 Vicryl followed  by a 4-0 Monocryl in a subcuticular stitch.  The skin was cleaned, dried, and reinforced with Dermabond.  The skin was dressed with a mesh dressing.  At the end of this case, I could palpate a radial and brachial pulse and there was strong ulnar signal.   COMPLICATIONS: none  CONDITION: stable   Adele Barthel, MD [facility] Office: [phone] Pager: [phone]  05/06/2020,  10:02 PM

## 2020-05-06 NOTE — ED Notes (Signed)
Date and time results received: 05/06/20 1832   Test: troponin Critical Value: 361  Name of Provider Notified: Dr. Fuller Plan

## 2020-05-07 ENCOUNTER — Observation Stay
Admit: 2020-05-07 | Discharge: 2020-05-07 | Disposition: A | Discharge status: Home / Self Care | Payer: Medicare Other | Attending: Internal Medicine | Admitting: Internal Medicine

## 2020-05-07 ENCOUNTER — Encounter: Payer: Self-pay | Admitting: Vascular Surgery

## 2020-05-07 ENCOUNTER — Observation Stay: Admit: 2020-05-07 | Payer: Medicare Other

## 2020-05-07 DIAGNOSIS — I2510 Atherosclerotic heart disease of native coronary artery without angina pectoris: Secondary | ICD-10-CM | POA: Diagnosis present

## 2020-05-07 DIAGNOSIS — Z79899 Other long term (current) drug therapy: Secondary | ICD-10-CM | POA: Diagnosis not present

## 2020-05-07 DIAGNOSIS — Z66 Do not resuscitate: Secondary | ICD-10-CM | POA: Diagnosis present

## 2020-05-07 DIAGNOSIS — Z833 Family history of diabetes mellitus: Secondary | ICD-10-CM | POA: Diagnosis not present

## 2020-05-07 DIAGNOSIS — Z7951 Long term (current) use of inhaled steroids: Secondary | ICD-10-CM | POA: Diagnosis not present

## 2020-05-07 DIAGNOSIS — Z20822 Contact with and (suspected) exposure to covid-19: Secondary | ICD-10-CM | POA: Diagnosis present

## 2020-05-07 DIAGNOSIS — I442 Atrioventricular block, complete: Secondary | ICD-10-CM | POA: Diagnosis present

## 2020-05-07 DIAGNOSIS — I214 Non-ST elevation (NSTEMI) myocardial infarction: Secondary | ICD-10-CM | POA: Diagnosis present

## 2020-05-07 DIAGNOSIS — I742 Embolism and thrombosis of arteries of the upper extremities: Principal | ICD-10-CM

## 2020-05-07 DIAGNOSIS — F0390 Unspecified dementia without behavioral disturbance: Secondary | ICD-10-CM | POA: Diagnosis present

## 2020-05-07 DIAGNOSIS — I5022 Chronic systolic (congestive) heart failure: Secondary | ICD-10-CM | POA: Diagnosis present

## 2020-05-07 DIAGNOSIS — E785 Hyperlipidemia, unspecified: Secondary | ICD-10-CM | POA: Diagnosis present

## 2020-05-07 DIAGNOSIS — I252 Old myocardial infarction: Secondary | ICD-10-CM | POA: Diagnosis not present

## 2020-05-07 DIAGNOSIS — I708 Atherosclerosis of other arteries: Secondary | ICD-10-CM | POA: Diagnosis present

## 2020-05-07 DIAGNOSIS — D72829 Elevated white blood cell count, unspecified: Secondary | ICD-10-CM | POA: Diagnosis present

## 2020-05-07 DIAGNOSIS — I70228 Atherosclerosis of native arteries of extremities with rest pain, other extremity: Secondary | ICD-10-CM | POA: Diagnosis present

## 2020-05-07 DIAGNOSIS — I6521 Occlusion and stenosis of right carotid artery: Secondary | ICD-10-CM | POA: Diagnosis present

## 2020-05-07 DIAGNOSIS — Z950 Presence of cardiac pacemaker: Secondary | ICD-10-CM | POA: Diagnosis not present

## 2020-05-07 DIAGNOSIS — Z7982 Long term (current) use of aspirin: Secondary | ICD-10-CM | POA: Diagnosis not present

## 2020-05-07 DIAGNOSIS — I110 Hypertensive heart disease with heart failure: Secondary | ICD-10-CM | POA: Diagnosis present

## 2020-05-07 DIAGNOSIS — J449 Chronic obstructive pulmonary disease, unspecified: Secondary | ICD-10-CM | POA: Diagnosis present

## 2020-05-07 DIAGNOSIS — I998 Other disorder of circulatory system: Secondary | ICD-10-CM | POA: Diagnosis present

## 2020-05-07 DIAGNOSIS — Z87891 Personal history of nicotine dependence: Secondary | ICD-10-CM | POA: Diagnosis not present

## 2020-05-07 LAB — CBC
HCT: 38.4 % — ABNORMAL LOW (ref 39.0–52.0)
HCT: 38.4 % — ABNORMAL LOW (ref 39.0–52.0)
Hemoglobin: 12.7 g/dL — ABNORMAL LOW (ref 13.0–17.0)
Hemoglobin: 12.8 g/dL — ABNORMAL LOW (ref 13.0–17.0)
MCH: 32.7 pg (ref 26.0–34.0)
MCH: 32.7 pg (ref 26.0–34.0)
MCHC: 33.1 g/dL (ref 30.0–36.0)
MCHC: 33.3 g/dL (ref 30.0–36.0)
MCV: 98.2 fL (ref 80.0–100.0)
MCV: 99 fL (ref 80.0–100.0)
Platelets: 206 10*3/uL (ref 150–400)
Platelets: 214 10*3/uL (ref 150–400)
RBC: 3.88 MIL/uL — ABNORMAL LOW (ref 4.22–5.81)
RBC: 3.91 MIL/uL — ABNORMAL LOW (ref 4.22–5.81)
RDW: 14.5 % (ref 11.5–15.5)
RDW: 14.6 % (ref 11.5–15.5)
WBC: 10.2 10*3/uL (ref 4.0–10.5)
WBC: 12.7 10*3/uL — ABNORMAL HIGH (ref 4.0–10.5)
nRBC: 0 % (ref 0.0–0.2)
nRBC: 0 % (ref 0.0–0.2)

## 2020-05-07 LAB — BASIC METABOLIC PANEL
Anion gap: 9 (ref 5–15)
BUN: 12 mg/dL (ref 8–23)
CO2: 30 mmol/L (ref 22–32)
Calcium: 8.7 mg/dL — ABNORMAL LOW (ref 8.9–10.3)
Chloride: 98 mmol/L (ref 98–111)
Creatinine, Ser: 0.78 mg/dL (ref 0.61–1.24)
GFR, Estimated: >60 mL/min (ref >60)
Glucose, Bld: 112 mg/dL — ABNORMAL HIGH (ref 70–99)
Potassium: 4.2 mmol/L (ref 3.5–5.1)
Sodium: 137 mmol/L (ref 135–145)

## 2020-05-07 LAB — TROPONIN I (HIGH SENSITIVITY)
Troponin I (High Sensitivity): 361 ng/L (ref <18)
Troponin I (High Sensitivity): 6211 ng/L (ref <18)
Troponin I (High Sensitivity): 8736 ng/L (ref <18)

## 2020-05-07 LAB — HEPARIN LEVEL (UNFRACTIONATED): Heparin Unfractionated: 0.25 IU/mL — ABNORMAL LOW (ref 0.30–0.70)

## 2020-05-07 MED ORDER — UMECLIDINIUM BROMIDE 62.5 MCG/INH IN AEPB
1.0000 | INHALATION_SPRAY | Freq: Every day | RESPIRATORY_TRACT | Status: DC
  Filled 2020-05-07: qty 7

## 2020-05-07 MED ORDER — POLYETHYLENE GLYCOL 3350 17 G PO PACK: 17.0000 g | PACK | Freq: Every day | ORAL | Status: DC | PRN

## 2020-05-07 MED ORDER — FLUTICASONE FUROATE-VILANTEROL 100-25 MCG/INH IN AEPB
1.0000 | INHALATION_SPRAY | Freq: Every day | RESPIRATORY_TRACT | Status: DC
  Filled 2020-05-07: qty 28

## 2020-05-07 MED ORDER — FLUTICASONE-UMECLIDIN-VILANT 100-62.5-25 MCG/INH IN AEPB: 1.0000 | INHALATION_SPRAY | Freq: Every day | RESPIRATORY_TRACT | Status: DC

## 2020-05-07 MED ORDER — APIXABAN 2.5 MG PO TABS: 2.5000 mg | ORAL_TABLET | Freq: Two times a day (BID) | ORAL | Status: DC

## 2020-05-07 MED ORDER — LOSARTAN POTASSIUM 50 MG PO TABS
100.0000 mg | ORAL_TABLET | Freq: Every day | ORAL | Status: DC
  Administered 2020-05-07 – 2020-05-08 (×2): 100 mg via ORAL
  Filled 2020-05-07 (×2): qty 2

## 2020-05-07 MED ORDER — APIXABAN 5 MG PO TABS
5.0000 mg | ORAL_TABLET | Freq: Two times a day (BID) | ORAL | Status: DC
  Administered 2020-05-07 – 2020-05-08 (×3): 5 mg via ORAL
  Filled 2020-05-07 (×3): qty 1

## 2020-05-07 NOTE — Progress Notes (Addendum)
PROGRESS NOTE    Gary York  AQT:[id number] DOB: [birth year] DOA: 05/06/2020 PCP: Marisue Ivan, MD   Brief Narrative: Taken from H&P. Gary York is a 87 y.o. male with medical history significant for heart failure reduced ejection fraction, hypertension, debility, advanced dementia, presents to the emergency room for chief concerns of right upper extremity pain and pulselessness. She lives in a facility. CTA chest, abdomen and pelvis was positive for long segment occlusion of left right internal carotid artery near the origin, proximal occlusion of right axillary artery and supraclavicular artery. No aneurysm or dissection was noted.  Vascular surgery was consulted and he was taken to the OR for embolectomy to preserve the limb. Successful restoration of pulses after procedure. Cardiology was consulted for concern of cardiac source of emboli and echocardiogram was done. Which was negative for any cardiac thrombi/emboli. It was negative for any shunting. EF of 35-40%. Paced rhythm, anteroapical hypokinesia.  Troponin was elevated  Subjective: Patient denies any chest pain or right arm pain. He was oriented to self only. Daughter at bedside.  Assessment & Plan:   Principal Problem:   Axillary artery thrombosis, right (HCC) Active Problems:   Occlusion of artery   Leukocytosis   Internal carotid artery occlusion, right   Limb ischemia  Critical right arm ischemia. S/p thrombectomy of right subclavian artery. Intraoperative findings were consistent with embolism versus thrombosis. He initially received heparin infusion, transition to Eliquis today. Good palpable radial pulse and clinical symptoms which include pain has been resolved. Echocardiogram without any cardiac thrombus but cannot rule out emboli from apical region per cardiology note, as anteroapical hypokinesia was noted on echo. -Cardiology is recommending low-dose aspirin and Eliquis. -Continue to  monitor  Elevated troponin/NSTEMI. No chest pain but troponin continued to rise, more than 8000 now. Discussed with cardiology and they are suspecting may be some emboli in coronary arteries. Not a candidate for any cath/surgical intervention. They are recommending medical management. -Continue with aspirin, statin and beta-blocker. -High risk for deterioration and death  HFrEF. Repeat echocardiogram with EF of 35 to 40% and some anteroapical hypokinesia. Appears euvolemic. -Continue home meds.  Hypertension. Blood pressure elevated. -Restart home dose of losartan -Continue home dose of metoprolol.  Advanced dementia. -Delirium precautions   Objective: Vitals:   05/06/20 2356 05/07/20 0404 05/07/20 0726 05/07/20 1203  BP: 137/75 (!) 155/82 (!) 171/94 (!) 157/86  Pulse: 75 74 74 75  Resp: 18 18 19 19   Temp: 98.2 F (36.8 C) 97.6 F (36.4 C) 97.7 F (36.5 C) 97.7 F (36.5 C)  TempSrc:  Oral  Oral  SpO2: 100% 94% 95% 100%  Weight:  81 kg    Height:        Intake/Output Summary (Last 24 hours) at 05/07/2020 1523 Last data filed at 05/07/2020 1412 Gross per 24 hour  Intake 644.18 ml  Output 400 ml  Net 244.18 ml   Filed Weights   05/06/20 1748 05/07/20 0404  Weight: 80.2 kg 81 kg    Examination:  General exam: Appears calm and comfortable  Respiratory system: Clear to auscultation. Respiratory effort normal. Cardiovascular system: S1 & S2 heard, RRR.  Gastrointestinal system: Soft, nontender, nondistended, bowel sounds positive. Central nervous system: Alert and oriented to self only. No focal neurological deficits. Extremities: No edema, no cyanosis, pulses intact and symmetrical. Psychiatry: Judgement and insight appear impaired   DVT prophylaxis: Eliquis Code Status: DNR Family Communication: Daughter was updated at bedside Disposition Plan:  Status is: Inpatient  Remains inpatient appropriate because:Inpatient level of care appropriate due to severity of  illness   Dispo: The patient is from: SNF              Anticipated d/c is to: SNF              Anticipated d/c date is: 1 day              Patient currently is not medically stable to d/c.   Difficult to place patient No              Level of care: Progressive Cardiac  Consultants:   Vascular surgery  Cardiology  Procedures:  Thrombectomy of right subclavian artery  Antimicrobials:   Data Reviewed: I have personally reviewed following labs and imaging studies  CBC: Recent Labs  Lab 05/06/20 1751 05/07/20 0039 05/07/20 0844  WBC 12.2* 12.7* 10.2  NEUTROABS 10.1*  --   --   HGB 13.1 12.7* 12.8*  HCT 38.6* 38.4* 38.4*  MCV 95.8 99.0 98.2  PLT 245 214 206   Basic Metabolic Panel: Recent Labs  Lab 05/06/20 1751 05/07/20 0844  NA 134* 137  K 3.5 4.2  CL 99 98  CO2 23 30  GLUCOSE 233* 112*  BUN 17 12  CREATININE 0.81 0.78  CALCIUM 8.8* 8.7*   GFR: Estimated Creatinine Clearance: 54 mL/min (by C-G formula based on SCr of 0.78 mg/dL). Liver Function Tests: No results for input(s): AST, ALT, ALKPHOS, BILITOT, PROT, ALBUMIN in the last 168 hours. No results for input(s): LIPASE, AMYLASE in the last 168 hours. No results for input(s): AMMONIA in the last 168 hours. Coagulation Profile: Recent Labs  Lab 05/06/20 1751  INR 1.4*   Cardiac Enzymes: No results for input(s): CKTOTAL, CKMB, CKMBINDEX, TROPONINI in the last 168 hours. BNP (last 3 results) No results for input(s): PROBNP in the last 8760 hours. HbA1C: No results for input(s): HGBA1C in the last 72 hours. CBG: No results for input(s): GLUCAP in the last 168 hours. Lipid Profile: No results for input(s): CHOL, HDL, LDLCALC, TRIG, CHOLHDL, LDLDIRECT in the last 72 hours. Thyroid Function Tests: Recent Labs    05/06/20 2220  TSH 3.244   Anemia Panel: No results for input(s): VITAMINB12, FOLATE, FERRITIN, TIBC, IRON, RETICCTPCT in the last 72 hours. Sepsis Labs: No results for input(s):  PROCALCITON, LATICACIDVEN in the last 168 hours.  Recent Results (from the past 240 hour(s))  Resp Panel by RT-PCR (Flu A&B, Covid) Nasopharyngeal Swab     Status: None   Collection Time: 05/06/20  5:51 PM   Specimen: Nasopharyngeal Swab; Nasopharyngeal(NP) swabs in vial transport medium  Result Value Ref Range Status   SARS Coronavirus 2 by RT PCR NEGATIVE NEGATIVE Final    Comment: (NOTE) SARS-CoV-2 target nucleic acids are NOT DETECTED.  The SARS-CoV-2 RNA is generally detectable in upper respiratory specimens during the acute phase of infection. The lowest concentration of SARS-CoV-2 viral copies this assay can detect is 138 copies/mL. A negative result does not preclude SARS-Cov-2 infection and should not be used as the sole basis for treatment or other patient management decisions. A negative result may occur with  improper specimen collection/handling, submission of specimen other than nasopharyngeal swab, presence of viral mutation(s) within the areas targeted by this assay, and inadequate number of viral copies(<138 copies/mL). A negative result must be combined with clinical observations, patient history, and epidemiological information. The expected result is Negative.  Fact Sheet for Patients:  BloggerCourse.com  Fact  Sheet for Healthcare Providers:  SeriousBroker.ithttps://www.fda.gov/media/152162/download  This test is no t yet approved or cleared by the [place] FDA and  has been authorized for detection and/or diagnosis of SARS-CoV-2 by FDA under an Emergency Use Authorization (EUA). This EUA will remain  in effect (meaning this test can be used) for the duration of the COVID-19 declaration under Section 564(b)(1) of the Act, 21 U.S.C.section 360bbb-3(b)(1), unless the authorization is terminated  or revoked sooner.       Influenza A by PCR NEGATIVE NEGATIVE Final   Influenza B by PCR NEGATIVE NEGATIVE Final    Comment: (NOTE) The Xpert Xpress  SARS-CoV-2/FLU/RSV plus assay is intended as an aid in the diagnosis of influenza from Nasopharyngeal swab specimens and should not be used as a sole basis for treatment. Nasal washings and aspirates are unacceptable for Xpert Xpress SARS-CoV-2/FLU/RSV testing.  Fact Sheet for Patients: BloggerCourse.comhttps://www.fda.gov/media/152166/download  Fact Sheet for Healthcare Providers: SeriousBroker.ithttps://www.fda.gov/media/152162/download  This test is not yet approved or cleared by the [place] FDA and has been authorized for detection and/or diagnosis of SARS-CoV-2 by FDA under an Emergency Use Authorization (EUA). This EUA will remain in effect (meaning this test can be used) for the duration of the COVID-19 declaration under Section 564(b)(1) of the Act, 21 U.S.C. section 360bbb-3(b)(1), unless the authorization is terminated or revoked.  Performed at [facility], [address] [place], [place] [id number]      Radiology Studies: CT ANGIO UP EXTREM RIGHT W &/OR WO CONTRAST  Result Date: 05/06/2020 CLINICAL DATA:  Cold right upper extremity EXAM: CT ANGIOGRAPHY UPPER RIGHT EXTREMITY TECHNIQUE: CT angiogram of the right upper extremity was performed following the intravenous administration of iodinated contrast. Multiplanar reformats were rendered and reviewed. CONTRAST:  125mL OMNIPAQUE IOHEXOL 350 MG/ML SOLN COMPARISON:  None. FINDINGS: There is proximal occlusion of the right axillary artery, with no contrast opacification of the right upper extremity vasculature distal to this occlusion. The osseous structures and soft tissues of the right upper extremity are unremarkable. Review of the MIP images confirms the above findings. IMPRESSION: Proximal occlusion of the right axillary artery, with no contrast opacification of the right upper extremity vasculature distal to this occlusion. Electronically Signed   By: Lauralyn PrimesAlex  Bibbey M.D.   On: 05/06/2020 20:18   DG Chest Portable 1 View  Result Date:  05/06/2020 CLINICAL DATA:  Assess for widened mediastinum. EXAM: PORTABLE CHEST 1 VIEW COMPARISON:  02/18/2020 FINDINGS: Cardiac silhouette partly obscured by elevated hemidiaphragms. It appears mildly enlarged. No mediastinal widening. No evidence of a mediastinal or hilar mass. Low lung volumes with vascular congestion. There is additional opacity at the bases is likely due to atelectasis. Possible small effusions. No pneumothorax. Right anterior chest wall sequential pacemaker is stable. Skeletal structures grossly intact. IMPRESSION: 1. No mediastinal widening. 2. Vascular congestion without overt pulmonary edema. No evidence of pneumonia. 3. Additional lung base opacity that is likely atelectasis. Small effusions possible. Electronically Signed   By: Amie Portlandavid  Ormond M.D.   On: 05/06/2020 18:38   CT Angio Chest/Abd/Pel for Dissection W and/or Wo Contrast  Result Date: 05/06/2020 CLINICAL DATA:  Cold right upper extremity EXAM: CT ANGIOGRAPHY CHEST, ABDOMEN AND PELVIS TECHNIQUE: Non-contrast CT of the chest was initially obtained. Multidetector CT imaging through the chest, abdomen and pelvis was performed using the standard protocol during bolus administration of intravenous contrast. Multiplanar reconstructed images and MIPs were obtained and reviewed to evaluate the vascular anatomy. CONTRAST:  125mL OMNIPAQUE IOHEXOL 350 MG/ML SOLN COMPARISON:  None. FINDINGS: CTA CHEST FINDINGS Cardiovascular: Preferential opacification of the thoracic aorta. No evidence of thoracic aortic aneurysm or dissection. Aortic atherosclerosis. Normal contour and caliber of the thoracic aorta. No evidence of aneurysm, dissection, or other acute aortic pathology. 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 Cardiomegaly. Three-vessel coronary artery calcifications and/or stents. No pericardial effusion. Mediastinum/Nodes: No enlarged mediastinal, hilar, or  axillary lymph nodes. Thyroid gland, trachea, and esophagus demonstrate no significant findings. Lungs/Pleura: Moderate right, small left pleural effusions and associated atelectasis or consolidation. Interlobular septal thickening throughout. Musculoskeletal: No chest wall abnormality. No acute or significant osseous findings. Review of the MIP images confirms the above findings. CTA ABDOMEN AND PELVIS FINDINGS VASCULAR Normal contour and caliber of the abdominal aorta without evidence of aneurysm, dissection, or other acute aortic pathology. Standard branching pattern of the abdominal aorta with solitary bilateral renal arteries. Atherosclerosis at the branch vessel origins without significant stenosis. Review of the MIP images confirms the above findings. NON-VASCULAR Hepatobiliary: No solid liver abnormality is seen. No gallstones, gallbladder wall thickening, or biliary dilatation. Pancreas: Unremarkable. No pancreatic ductal dilatation or surrounding inflammatory changes. Spleen: Normal in size without significant abnormality. Adrenals/Urinary Tract: Adrenal glands are unremarkable. Exophytic cysts of the right kidney. Kidneys are otherwise normal, without renal calculi, solid lesion, or hydronephrosis. Bladder is unremarkable. Stomach/Bowel: Stomach is within normal limits. Appendix appears normal. No evidence of bowel wall thickening, distention, or inflammatory changes. Lymphatic: No enlarged abdominal or pelvic lymph nodes. Reproductive: No mass or other significant abnormality. Other: No abdominal wall hernia or abnormality. No abdominopelvic ascites. Musculoskeletal: No acute or significant osseous findings. 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 Review of the MIP images confirms the above findings. CTA RIGHT UPPER EXTREMITY FINDINGS: There is proximal occlusion of the right axillary artery, with no opacification of the right upper extremity vasculature distal  to this occlusion (series 5, image 72). IMPRESSION: 1. Normal contour and caliber of the thoracic and abdominal aorta without evidence of aneurysm, dissection, or other acute aortic pathology. 2. 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 3. Proximal occlusion of the right axillary artery, with no opacification of the right upper extremity vasculature distal to this occlusion. 4. Moderate right, small left pleural effusions and associated atelectasis or consolidation. Mild pulmonary edema. 5. Cardiomegaly and coronary artery disease. Electronically Signed   By: Lauralyn Primes M.D.   On: 05/06/2020 19:40    Scheduled Meds: . apixaban  5 mg Oral BID  . aspirin EC  81 mg Oral Q0600  . atorvastatin  80 mg Oral q1800  . docusate sodium  100 mg Oral Daily  . fluticasone furoate-vilanterol  1 puff Inhalation Daily   And  . umeclidinium bromide  1 puff Inhalation Daily  . isosorbide mononitrate  30 mg Oral Daily  . metoprolol succinate  25 mg Oral Daily  . pantoprazole  40 mg Oral Daily   Continuous Infusions: . sodium chloride    . sodium chloride 50 mL/hr at 05/07/20 0001  . magnesium sulfate bolus IVPB       LOS: 0 days   Time spent: 40 minutes.  Arnetha Courser, MD [facility]  If 7PM-7AM, please contact night-coverage [email or web address]  05/07/2020, 3:23 PM   This record has been created using Conservation officer, historic buildings. Errors have been sought and corrected,but may not always be located. Such creation  errors do not reflect on the standard of care.

## 2020-05-07 NOTE — Consult Note (Signed)
Cardiology Consultation Note    Patient ID: Gary York, MRN: [id number], DOB/AGE: [birth year] 87 y.o. Admit date: 05/06/2020   Date of Consult: 05/07/2020 Primary Physician: Marisue IvanLinthavong, Kanhka, MD Primary Cardiologist: Dr. Gwen PoundsKowalski  Chief Complaint: right arm pain Reason for Consultation: right sca occlusion Requesting MD: Dr. Nelson ChimesAmin  HPI: Gary York is a 87 y.o. male with history of CAD, SHB with ppm who presented ith acute right arm pain. Had a cold right hand with cyanosis. No history of trauma. Was on asa 81 mg daily, atorvastatin 80 daily, imdur 30 daily, metoprolol succ 25 mg daily and losartan 100 daily as outpatient. CXR reveled no pulmonary edema. CT of chest revealed no dissection or aneurysm but a long segment occlusion of the right axillary artery.Ct of right upper extremity revealed proximal occlusion or the right axillary artery  With no opacification of the right upper extremity. Pt underwent a right subclavian artery thromboembolectomy per vascular surgery. Had elevated troponin. No chest pain. EKG showed a pace, v paced rhythm. He feels less arm pain this am. No chest pain. hsTROP drawn revealed [phone] thus far. Echo with bubble study pending.   Past Medical History:  Diagnosis Date  . CAD (coronary artery disease)   . Chronic systolic CHF (congestive heart failure) (HCC) 02/18/2020  . Complete heart block (HCC)   . COPD (chronic obstructive pulmonary disease) (HCC)   . Hyperlipidemia   . Hypertension       Surgical History:  Past Surgical History:  Procedure Laterality Date  . PACEMAKER IMPLANT    . THROMBECTOMY BRACHIAL ARTERY Right 05/06/2020   Procedure: THROMBECTOMY BRACHIAL ARTERY;  Surgeon: Fransisco Hertzhen, Brian L, MD;  Location: [facility] ORS;  Service: Vascular;  Laterality: Right;     Home Meds: Prior to Admission medications   Medication Sig Start Date End Date Taking? Authorizing Provider  aspirin EC 81 MG EC tablet Take 1 tablet (81 mg total) by mouth daily.  09/13/17  Yes Enid BaasKalisetti, Radhika, MD  atorvastatin (LIPITOR) 80 MG tablet Take 1 tablet (80 mg total) by mouth daily at 6 PM. 09/12/17  Yes Enid BaasKalisetti, Radhika, MD  docusate sodium (COLACE) 100 MG capsule Take 100 mg by mouth daily.   Yes [provider]  Fluticasone-Umeclidin-Vilant (TRELEGY ELLIPTA) 100-62.5-25 MCG/INH AEPB Inhale 1 puff into the lungs daily.   Yes [provider]  isosorbide mononitrate (IMDUR) 30 MG 24 hr tablet Take 30 mg by mouth daily.    Yes [provider]  losartan (COZAAR) 100 MG tablet Take 100 mg by mouth daily. 11/20/16  Yes [provider]  metoprolol succinate (TOPROL-XL) 25 MG 24 hr tablet Take 25 mg by mouth daily.    Yes [provider]  pantoprazole (PROTONIX) 40 MG tablet Take 40 mg by mouth daily.    Yes [provider]  acetaminophen (TYLENOL) 500 MG tablet Take 500 mg by mouth every 6 (six) hours as needed for mild pain, moderate pain or fever.     [provider]  albuterol (PROVENTIL HFA;VENTOLIN HFA) 108 (90 Base) MCG/ACT inhaler Inhale 2 puffs into the lungs every 6 (six) hours as needed for wheezing or shortness of breath. 09/12/17   Enid BaasKalisetti, Radhika, MD  albuterol (PROVENTIL) (2.5 MG/3ML) 0.083% nebulizer solution Take 2.5 mg by nebulization every 6 (six) hours as needed for wheezing or shortness of breath.     [provider]  dextromethorphan-guaiFENesin (MUCINEX DM) 30-600 MG 12hr tablet Take 1 tablet by mouth at bedtime.  [provider]  magnesium hydroxide (MILK OF MAGNESIA) 400 MG/5ML suspension Take 30 mLs by mouth at bedtime as needed for mild constipation.    [provider]  nitroGLYCERIN (NITROSTAT) 0.4 MG SL tablet Place 0.4 mg under the tongue every 5 (five) minutes as needed for chest pain.  08/10/18   [provider]  polyethylene glycol (MIRALAX / GLYCOLAX) packet Take 17 g by mouth daily as needed. Patient taking differently: Take 17 g by  mouth daily as needed for mild constipation or moderate constipation. 09/18/17   Alford Highland, MD  senna (SENOKOT) 8.6 MG TABS tablet Take 1 tablet by mouth daily.    [provider]  sertraline (ZOLOFT) 25 MG tablet Take 25 mg by mouth daily. 04/25/20   [provider]    Inpatient Medications:  . aspirin EC  81 mg Oral Q0600  . atorvastatin  80 mg Oral q1800  . docusate sodium  100 mg Oral Daily  . fluticasone furoate-vilanterol  1 puff Inhalation Daily   And  . umeclidinium bromide  1 puff Inhalation Daily  . isosorbide mononitrate  30 mg Oral Daily  . metoprolol succinate  25 mg Oral Daily  . pantoprazole  40 mg Oral Daily   . sodium chloride    . sodium chloride 50 mL/hr at 05/07/20 0001  .  ceFAZolin (ANCEF) IV 2 g (05/07/20 4235)  . heparin 1,000 Units/hr (05/06/20 2239)  . magnesium sulfate bolus IVPB      Allergies: No Known Allergies  Social History   Socioeconomic History  . Marital status: Married    Spouse name: Not on file  . Number of children: Not on file  . Years of education: Not on file  . Highest education level: Not on file  Occupational History  . Not on file  Tobacco Use  . Smoking status: Former Smoker    Packs/day: 1.00    Years: 20.00    Pack years: 20.00    Types: Cigarettes    Quit date: 1990    Years since quitting: 32.1  . Smokeless tobacco: Never Used  Substance and Sexual Activity  . Alcohol use: Not Currently  . Drug use: Never  . Sexual activity: Not on file  Other Topics Concern  . Not on file  Social History Narrative  . Not on file   Social Determinants of Health   Financial Resource Strain: Not on file  Food Insecurity: Not on file  Transportation Needs: Not on file  Physical Activity: Not on file  Stress: Not on file  Social Connections: Not on file  Intimate Partner Violence: Not on file     Family History  Problem Relation Age of Onset  . Dementia Mother   . Diabetes Mother   . Dementia  Father      Review of Systems: A 12-system review of systems was performed and is negative except as noted in the HPI.  Labs: No results for input(s): CKTOTAL, CKMB, TROPONINI in the last 72 hours. Lab Results  Component Value Date   WBC 12.7 (H) 05/07/2020   HGB 12.7 (L) 05/07/2020   HCT 38.4 (L) 05/07/2020   MCV 99.0 05/07/2020   PLT 214 05/07/2020    Recent Labs  Lab 05/06/20 1751  NA 134*  K 3.5  CL 99  CO2 23  BUN 17  CREATININE 0.81  CALCIUM 8.8*  GLUCOSE 233*   Lab Results  Component Value Date   CHOL 67 06/12/2018  HDL 36 (L) 06/12/2018   LDLCALC 25 06/12/2018   TRIG 29 06/12/2018   No results found for: DDIMER  Radiology/Studies:  CT ANGIO UP EXTREM RIGHT W &/OR WO CONTRAST  Result Date: 05/06/2020 CLINICAL DATA:  Cold right upper extremity EXAM: CT ANGIOGRAPHY UPPER RIGHT EXTREMITY TECHNIQUE: CT angiogram of the right upper extremity was performed following the intravenous administration of iodinated contrast. Multiplanar reformats were rendered and reviewed. CONTRAST:  OMNIPAQUE IOHEXOL 350 MG/ML SOLN COMPARISON:  None. FINDINGS: There is proximal occlusion of the right axillary artery, with no contrast opacification of the right upper extremity vasculature distal to this occlusion. The osseous structures and soft tissues of the right upper extremity are unremarkable. Review of the MIP images confirms the above findings. IMPRESSION: Proximal occlusion of the right axillary artery, with no contrast opacification of the right upper extremity vasculature distal to this occlusion. Electronically Signed   By: Lauralyn Primes M.D.   On: 05/06/2020 20:18   DG Chest Portable 1 View  Result Date: 05/06/2020 CLINICAL DATA:  Assess for widened mediastinum. EXAM: PORTABLE CHEST 1 VIEW COMPARISON:  02/18/2020 FINDINGS: Cardiac silhouette partly obscured by elevated hemidiaphragms. It appears mildly enlarged. No mediastinal widening. No evidence of a mediastinal or hilar  mass. Low lung volumes with vascular congestion. There is additional opacity at the bases is likely due to atelectasis. Possible small effusions. No pneumothorax. Right anterior chest wall sequential pacemaker is stable. Skeletal structures grossly intact. IMPRESSION: 1. No mediastinal widening. 2. Vascular congestion without overt pulmonary edema. No evidence of pneumonia. 3. Additional lung base opacity that is likely atelectasis. Small effusions possible. Electronically Signed   By: Amie Portland M.D.   On: 05/06/2020 18:38   CT Angio Chest/Abd/Pel for Dissection W and/or Wo Contrast  Result Date: 05/06/2020 CLINICAL DATA:  Cold right upper extremity EXAM: CT ANGIOGRAPHY CHEST, ABDOMEN AND PELVIS TECHNIQUE: Non-contrast CT of the chest was initially obtained. Multidetector CT imaging through the chest, abdomen and pelvis was performed using the standard protocol during bolus administration of intravenous contrast. Multiplanar reconstructed images and MIPs were obtained and reviewed to evaluate the vascular anatomy. CONTRAST:  OMNIPAQUE IOHEXOL 350 MG/ML SOLN COMPARISON:  None. FINDINGS: CTA CHEST FINDINGS Cardiovascular: Preferential opacification of the thoracic aorta. No evidence of thoracic aortic aneurysm or dissection. Aortic atherosclerosis. Normal contour and caliber of the thoracic aorta. No evidence of aneurysm, dissection, or other acute aortic pathology. 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 Cardiomegaly. Three-vessel coronary artery calcifications and/or stents. No pericardial effusion. Mediastinum/Nodes: No enlarged mediastinal, hilar, or axillary lymph nodes. Thyroid gland, trachea, and esophagus demonstrate no significant findings. Lungs/Pleura: Moderate right, small left pleural effusions and associated atelectasis or consolidation. Interlobular septal thickening throughout. Musculoskeletal: No chest wall  abnormality. No acute or significant osseous findings. Review of the MIP images confirms the above findings. CTA ABDOMEN AND PELVIS FINDINGS VASCULAR Normal contour and caliber of the abdominal aorta without evidence of aneurysm, dissection, or other acute aortic pathology. Standard branching pattern of the abdominal aorta with solitary bilateral renal arteries. Atherosclerosis at the branch vessel origins without significant stenosis. Review of the MIP images confirms the above findings. NON-VASCULAR Hepatobiliary: No solid liver abnormality is seen. No gallstones, gallbladder wall thickening, or biliary dilatation. Pancreas: Unremarkable. No pancreatic ductal dilatation or surrounding inflammatory changes. Spleen: Normal in size without significant abnormality. Adrenals/Urinary Tract: Adrenal glands are unremarkable. Exophytic cysts of the right kidney. Kidneys  are otherwise normal, without renal calculi, solid lesion, or hydronephrosis. Bladder is unremarkable. Stomach/Bowel: Stomach is within normal limits. Appendix appears normal. No evidence of bowel wall thickening, distention, or inflammatory changes. Lymphatic: No enlarged abdominal or pelvic lymph nodes. Reproductive: No mass or other significant abnormality. Other: No abdominal wall hernia or abnormality. No abdominopelvic ascites. Musculoskeletal: No acute or significant osseous findings. 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 Review of the MIP images confirms the above findings. CTA RIGHT UPPER EXTREMITY FINDINGS: There is proximal occlusion of the right axillary artery, with no opacification of the right upper extremity vasculature distal to this occlusion (series 5, image 72). IMPRESSION: 1. Normal contour and caliber of the thoracic and abdominal aorta without evidence of aneurysm, dissection, or other acute aortic pathology. 2. Long segment occlusion of the proximal right internal carotid artery near the  origin. 3. Proximal occlusion of the right axillary artery, with no opacification of the right upper extremity vasculature distal to this occlusion. 4. Moderate right, small left pleural effusions and associated atelectasis or consolidation. Mild pulmonary edema. 5. Cardiomegaly and coronary artery disease. Electronically Signed   By: Lauralyn Primes M.D.   On: 05/06/2020 19:40    Wt Readings from Last 3 Encounters:  05/07/20 81 kg  02/18/20 91.6 kg  06/15/18 92.9 kg    EKG: atrial pace, ventricular pace.   Physical Exam:elderly male in no acute distress Blood pressure (!) 171/94, pulse 74, temperature 97.7 F (36.5 C), resp. rate 19, height  (1.753 m), weight 81 kg, SpO2 95 %. Body mass index is 26.36 kg/m. General: Well developed, well nourished, in no acute distress. Head: Normocephalic, atraumatic, sclera non-icteric, no xanthomas, nares are without discharge.  Neck: Negative for carotid bruits. JVD not elevated. Lungs: Clear bilaterally to auscultation without wheezes, rales, or rhonchi. Breathing is unlabored. Heart: RRR with S1 S2. No murmurs, rubs, or gallops appreciated. Abdomen: Soft, non-tender, non-distended with normoactive bowel sounds. No hepatomegaly. No rebound/guarding. No obvious abdominal masses. Msk:  Strength and tone appear normal for age. Extremities: No clubbing or cyanosis. No edema.  Distal pedal pulses are 2+ and equal bilaterally. Neuro: Alert and oriented X 3. No facial asymmetry. No focal deficit. Moves all extremities spontaneously. Psych:  Responds to questions appropriately with a normal affect.     Assessment and Plan  87 year old male with history of complete heart block status post dual-chamber pacemaker presented with acute onset right arm pain was noted to have a right subclavian artery occlusion.  Underwent thrombectomy.  Denied chest pain.  EKG showed atrial paced ventricular paced rhythm.  Has been placed on heparin.  Serum troponin drawn in  the ER was initially 361 and subsequently 593.  Currently remains on heparin with a hematoma at the access site.  Feelings from vascular surgery was low risk for compartment syndrome.  The fascia was not reapproximated post procedure.  1.  Right subclavian artery occlusion-status post thrombectomy.  On heparin.  Small hematoma at the site.  Further anticoagulation therapy discussion was requested.  Patient is a sensed V paced.  Heparin was continued due to elevated troponin.  Patient has no chest pain.  EKG not helpful due to the paced rhythm.  Doubt acute  Coronary ischemic syndrome.  Patient is not an ideal candidate for invasive cardiac evaluation at this point.  Will review echo when available to evaluate for evidence of right to left shunting.  No neurologic deficits.  2.  Elevated  troponin-likely secondary to demand.  Is on heparin.  Echo pending.  Will review echo when available.Will discontinue heparin and start on low dose NOAC with eliquis at 2.5 mg bid and 81 mg asa.     Signed, Dalia Heading MD 05/07/2020, 9:02 AM Pager: [phone]

## 2020-05-07 NOTE — Progress Notes (Addendum)
[facility]  Daily Progress Note   Assessment/Planning:   POD #1 s/p thrombectomy of R SCA ? R arm ischemia   No compartment syndrome overnight but pt developed small hematoma that is soft  Not unexpected as pt on full anticoagulation  Doubt pt will get compartment syndrome from hematoma as I did not reapproximate the fascia intentional given the high risk of bleeding  Intraop findings are consistent with embolism vs thrombosis  Continue Heparin drip while cardiac work-up in progress  Cardiology evaluation will be essential, especially as the high troponin continues to climb  Repeat Troponin already schedule  Pt need TTE to evaluate for intracardiac thrombus  Further work-up per Hospitalist Team and Cardiology  Dr. Wyn Quaker will take over care tomorrow   Subjective  - 1 Day Post-Op   Minimally interactive, c/o some pain with examining arm   Objective   Vitals:   05/06/20 2330 05/06/20 2356 05/07/20 0404 05/07/20 0726  BP: 130/76 137/75 (!) 155/82 (!) 171/94  Pulse: 75 75 74 74  Resp: 13 18 18 19   Temp: (!) 97.3 F (36.3 C) 98.2 F (36.8 C) 97.6 F (36.4 C) 97.7 F (36.5 C)  TempSrc:   Oral   SpO2: 95% 100% 94% 95%  Weight:   81 kg   Height:         Intake/Output Summary (Last 24 hours) at 05/07/2020 0853 Last data filed at 05/07/2020 0726 Gross per 24 hour  Intake 644.18 ml  Output 200 ml  Net 444.18 ml    GEN Awake, minimally interactive  CV  RRR  VASC Palpable radial pulse, small hematoma at surgical site, soft, R hand appears well perfused  NEURO Unable to evaluate as pt not cooperative    Laboratory   CBC CBC Latest Ref Rng & Units 05/07/2020 05/06/2020 02/21/2020  WBC 4.0 - 10.5 K/uL 12.7(H) 12.2(H) 15.7(H)  Hemoglobin 13.0 - 17.0 g/dL 12.7(L) 13.1 11.5(L)  Hematocrit 39.0 - 52.0 % 38.4(L) 38.6(L) 33.4(L)  Platelets 150 - 400 K/uL 214 245 191    BMET    Component Value Date/Time   NA 134 (L) 05/06/2020 1751   NA 138  08/26/2013 1357   K 3.5 05/06/2020 1751   K 4.1 08/26/2013 1357   CL 99 05/06/2020 1751   CL 103 08/26/2013 1357   CO2 23 05/06/2020 1751   CO2 30 08/26/2013 1357   GLUCOSE 233 (H) 05/06/2020 1751   GLUCOSE 93 08/26/2013 1357   BUN 17 05/06/2020 1751   BUN 16 08/26/2013 1357   CREATININE 0.81 05/06/2020 1751   CREATININE 1.04 08/26/2013 1357   CALCIUM 8.8 (L) 05/06/2020 1751   CALCIUM 9.1 08/26/2013 1357   GFRNONAA >60 05/06/2020 1751   GFRNONAA >60 08/26/2013 1357   GFRAA >60 06/15/2018 1553   GFRAA >60 08/26/2013 1357   Troponin: 361 ? 593   10/26/2013, MD, FACS, FSVS Covering for [facility]: [phone]

## 2020-05-07 NOTE — Progress Notes (Addendum)
ECHO revealed ef 35-40% with paced rhythm. Anteroapical hypokinesis. No apical thrombus noted. No shunting with bubble study. Mild aortic stenosis. Full report when soft ware allows.   OK for discharge from cardiac standpoint on asa81 mg, eliquis 2.5 mg bid. Out patient  follow up in our clinic I 1 week.   Toroponin now is at 8736. Will follow in hospital . Medical management of probable cad Not a candidate for cardiac cath given comorbidities.

## 2020-05-07 NOTE — TOC Progression Note (Addendum)
Transition of Care [facility]) - Progression Note    Patient Details  Name: Gary York MRN: [id number] Date of Birth: Mar 13, 1922  [facility] CM/SW Contact  [name], RN Phone Number: 05/07/2020, 2:36 PM  Clinical Narrative:   05/07/20 Potential discharge today back to [facility] [phone]. [name] RN is checking with the DON on taking him back today or tomorrow. Will call CM back. Pt was just cleared by cardiology. [name] RN CM  1445 Update: [name] at [facility] returned call and DON is not comfortable accepting patient today. IF continues stable, will accept 05/07/20.   Barrier to discharge today. SNF will not accept today; not stable enough for SNF/Staffing. [name] RN CM       Expected Discharge Plan and Services                                                 Social Determinants of Health (SDOH) Interventions    Readmission Risk Interventions No flowsheet data found.

## 2020-05-07 NOTE — Progress Notes (Signed)
OT Cancellation Note  Patient Details Name: GURJOT BRISCO MRN: [id number] DOB: November 08, 1921   Cancelled Treatment:    Reason Eval/Treat Not Completed: Medical issues which prohibited therapy. Vascular surgeon reports no RUE restrictions s/p surgery, but notes that pt has hematoma and post-op pain with recommendation to hold therapy start until tomorrow. Will f/u as pt is medically stable.   Of note, troponin at 8,000 however pt d/c from cardiology consult.   Matthew Folks, OTR/L [facility] [phone]

## 2020-05-07 NOTE — Progress Notes (Addendum)
PT Cancellation Note  Patient Details Name: Gary York MRN: [id number] DOB: May 30, 1921   Cancelled Treatment:    Reason Eval/Treat Not Completed: Other (comment) PT orders received, chart reviewed. Pt with new cardiology consult orders 2/2 elevated troponin. Will hold PT evaluation until pt medically cleared by cardiology.   Addendum: Vascular surgeon reports no RUE restrictions s/p surgery but notes pt has hematoma & post op pain with recommendations to hold PT start until tomorrow. Will f/u as pt is medically stable.   Aleda Grana, PT, DPT 05/07/20, 8:53 AM    Sandi Mariscal 05/07/2020, 8:49 AM

## 2020-05-08 LAB — ECHOCARDIOGRAM COMPLETE BUBBLE STUDY
AR max vel: 0.68 cm2
AV Area VTI: 0.65 cm2
AV Area mean vel: 0.7 cm2
AV Mean grad: 12 mmHg
AV Peak grad: 22.7 mmHg
Ao pk vel: 2.38 m/s
Area-P 1/2: 3.06 cm2
Calc EF: 40.5 %
S' Lateral: 3.92 cm
Single Plane A2C EF: 36.1 %
Single Plane A4C EF: 41.7 %

## 2020-05-08 LAB — CBC
HCT: 39.1 % (ref 39.0–52.0)
Hemoglobin: 13.1 g/dL (ref 13.0–17.0)
MCH: 32.8 pg (ref 26.0–34.0)
MCHC: 33.5 g/dL (ref 30.0–36.0)
MCV: 97.8 fL (ref 80.0–100.0)
Platelets: 224 10*3/uL (ref 150–400)
RBC: 4 MIL/uL — ABNORMAL LOW (ref 4.22–5.81)
RDW: 14.5 % (ref 11.5–15.5)
WBC: 10 10*3/uL (ref 4.0–10.5)
nRBC: 0 % (ref 0.0–0.2)

## 2020-05-08 LAB — BASIC METABOLIC PANEL
Anion gap: 6 (ref 5–15)
BUN: 12 mg/dL (ref 8–23)
CO2: 29 mmol/L (ref 22–32)
Calcium: 8.6 mg/dL — ABNORMAL LOW (ref 8.9–10.3)
Chloride: 98 mmol/L (ref 98–111)
Creatinine, Ser: 0.82 mg/dL (ref 0.61–1.24)
GFR, Estimated: >60 mL/min (ref >60)
Glucose, Bld: 121 mg/dL — ABNORMAL HIGH (ref 70–99)
Potassium: 4.2 mmol/L (ref 3.5–5.1)
Sodium: 133 mmol/L — ABNORMAL LOW (ref 135–145)

## 2020-05-08 LAB — TROPONIN I (HIGH SENSITIVITY)
Troponin I (High Sensitivity): 5580 ng/L (ref <18)
Troponin I (High Sensitivity): 4227 ng/L (ref <18)
Troponin I (High Sensitivity): 3915 ng/L (ref <18)
Troponin I (High Sensitivity): 3659 ng/L (ref <18)

## 2020-05-08 MED ORDER — APIXABAN 5 MG PO TABS: 5.0000 mg | ORAL_TABLET | Freq: Two times a day (BID) | ORAL | Status: DC

## 2020-05-08 NOTE — TOC Initial Note (Signed)
Transition of Care [facility]) - Initial/Assessment Note    Patient Details  Name: Gary York MRN: [id number] Date of Birth: 1921/12/14  Transition [facility] Care [facility]) CM/SW Contact:    Chapman Fitch, RN Phone Number: 05/08/2020, 11:37 AM  Clinical Narrative:                 Patient returning to [facility] SNF private pay Daughter at bed side DC info sent in the hub  EMS packet and signed DNR on chart Bedside RN to call report  EMS transport arranged.  Daughter updated   Expected Discharge Plan: Skilled Nursing Facility Barriers to Discharge: No Barriers Identified   Patient Goals and CMS Choice        Expected Discharge Plan and Services Expected Discharge Plan: Skilled Nursing Facility         Expected Discharge Date: 05/08/20               DME Arranged: N/A DME Agency: NA       HH Arranged: NA HH Agency: NA        Prior Living Arrangements/Services                       Activities of Daily Living      Permission Sought/Granted                  Emotional Assessment       Orientation: : Oriented to Self,Oriented to Place   Psych Involvement: No (comment)  Admission diagnosis:  Occlusion of artery [I70.90] Arterial occlusion [I70.90] Elevated troponin [R77.8] Limb ischemia [I99.8] Patient Active Problem List   Diagnosis Date Noted  . Limb ischemia 05/07/2020  . Occlusion of artery 05/06/2020  . 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 Leukocytosis 05/06/2020  . Internal carotid artery occlusion, right 05/06/2020  . Pneumonia 02/19/2020  . COPD exacerbation (HCC) 02/18/2020  . Hyperglycemia 02/18/2020  . Chronic systolic CHF (congestive heart failure) (HCC) 02/18/2020  . Aortic stenosis 02/18/2020  . Hyponatremia 02/18/2020  . Hypocalcemia 02/18/2020  . Normocytic anemia 02/18/2020  . Hyperlipidemia   . Pain due to onychomycosis of toenails of both feet 01/21/2019  . CHF (congestive heart failure) (HCC) 09/16/2017   . Chest pain 09/09/2017   PCP:  Marisue Ivan, MD Pharmacy:   [facility] [place], [place] - [address] [place] [address] Phone: [phone] Fax: [phone]     Social Determinants of Health (SDOH) Interventions    Readmission Risk Interventions No flowsheet data found.

## 2020-05-08 NOTE — Evaluation (Signed)
Physical Therapy Evaluation Patient Details Name: Gary York MRN: [id number] DOB: [birth year] Today's Date: 05/08/2020   History of Present Illness  Pt is a 87 y/o M admitted on 05/06/20 from [facility] with c/c of RUE pulselessness & cyanosis. 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 Pt underwent Right subclavian artery thromboembolectomy on 05/06/20. PMH: HFrEF, HTN, debility, advanced dementia, CAD, chronic systolic CHF, complete heart block, COPD, HLD  Clinical Impression  Pt seen for co-tx with OT to maximize pt participation in tx. Pt cleared for participation by cardiologist. Pt currently requires min assist for transfers & CGA<>[name]. Pt with very supportive daughter present. Anticipate pt can return to [facility] with f/u HHPT.     Follow Up Recommendations Home health PT;Supervision for mobility/OOB    Equipment Recommendations  None recommended by PT (pt already has RW\)    Recommendations for Other Services       Precautions / Restrictions Precautions Precautions: Fall Restrictions Weight Bearing Restrictions: No      Mobility  Bed Mobility               General bed mobility comments: not observed as pt received & left sitting in recliner    Transfers Overall transfer level: Needs assistance   Transfers: Sit to/from Stand Sit to Stand: Min assist         General transfer comment: slight boost for sit>stand, assist with hand placement on armrests for stand>sit  Ambulation/Gait Ambulation/Gait assistance: Min guard;Supervision Gait Distance (Feet): 140 Feet Assistive device: Rolling walker (2 wheeled) Gait Pattern/deviations: Decreased step length - left;Decreased step length - right;Decreased stride length Gait velocity: decreased      Stairs            Wheelchair Mobility    Modified Rankin (Stroke Patients Only)       Balance Overall balance assessment: Needs  assistance           Standing balance-Leahy Scale: Poor Standing balance comment: BUE support on RW                             Pertinent Vitals/Pain Pain Assessment: No/denies pain    Home Living Family/patient expects to be discharged to:: Skilled nursing facility                 Additional Comments: Per daughter, pt recently [place] SNF part.    Prior Function Level of Independence: Needs assistance         Comments: Daughter Gary York) reports pt requires assistance for sit<>stand, [name], [name]     Hand Dominance        Extremity/Trunk Assessment   Upper Extremity Assessment Upper Extremity Assessment: Defer to OT evaluation    Lower Extremity Assessment Lower Extremity Assessment: Generalized weakness       Communication   Communication: HOH (but per daughter pt "doesn't like to be yelled at")  Cognition Arousal/Alertness: Awake/alert Behavior During Therapy: WFL for tasks assessed/performed Overall Cognitive Status: History of cognitive impairments - at baseline Area of Impairment: Orientation                 Orientation Level: Disoriented to;Time;Situation                    General Comments General  comments (skin integrity, edema, etc.): BP at beginning of session 104/64 mmHg, Pt on 2L/min via nasal cannula during session, SpO2 91-92% at end of session (unable to obtain accurate reading from pulse ox during mobility)    Exercises     Assessment/Plan    PT Assessment Patient needs continued PT services  PT Problem List Decreased strength;Decreased mobility;Decreased safety awareness;Decreased activity tolerance;Decreased cognition;Cardiopulmonary status limiting activity;Decreased balance;Decreased knowledge of use of DME       PT Treatment Interventions DME instruction;Therapeutic activities;Gait  training;Therapeutic exercise;Modalities;Patient/family education;Balance training;Functional mobility training;Manual techniques;Neuromuscular re-education    PT Goals (Current goals can be found in the Care Plan section)  Acute Rehab PT Goals Patient Stated Goal: return to [facility] PT Goal Formulation: With family Time For Goal Achievement: 05/22/20 Potential to Achieve Goals: Good    Frequency Min 2X/week   Barriers to discharge        Co-evaluation PT/OT/SLP Co-Evaluation/Treatment: Yes Reason for Co-Treatment: For patient/therapist safety;Necessary to address cognition/behavior during functional activity;To address functional/ADL transfers PT goals addressed during session: Mobility/safety with mobility;Balance;Proper use of DME OT goals addressed during session: ADL's and self-care;Strengthening/ROM       AM-PAC PT "6 Clicks" Mobility  Outcome Measure Help needed turning from your back to your side while in a flat bed without using bedrails?: A Little Help needed moving from lying on your back to sitting on the side of a flat bed without using bedrails?: A Little Help needed moving to and from a bed to a chair (including a wheelchair)?: A Little Help needed standing up from a chair using your arms (e.g., wheelchair or bedside chair)?: A Little Help needed to walk in hospital room?: A Little Help needed climbing 3-5 steps with a railing? : A Lot 6 Click Score: 17    End of Session Equipment Utilized During Treatment: Gait belt;Oxygen Activity Tolerance: Patient tolerated treatment well Patient left: in chair;with call bell/phone within reach;with family/visitor present;with chair alarm set Nurse Communication:  (BP) [name];[name];[name], [name]    Time: 3009-2330 PT Time Calculation (min) (ACUTE ONLY): 19 min   Charges:   PT Evaluation $PT Eval Low  Complexity: 1 Low          Aleda Grana, PT, DPT 05/08/20, 11:18 AM   Sandi Mariscal 05/08/2020, 11:17 AM

## 2020-05-08 NOTE — Evaluation (Signed)
Occupational Therapy Evaluation Patient Details Name: Gary York MRN: [id number] DOB: 1921/05/31 Today's Date: 05/08/2020    History of Present Illness Pt is a 87 y/o M admitted on 05/06/20 from [facility] with c/c of RUE pulselessness & cyanosis. 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 Pt underwent Right subclavian artery thromboembolectomy on 05/06/20. PMH: HFrEF, HTN, debility, advanced dementia, CAD, chronic systolic CHF, complete heart block, COPD, HLD   Clinical Impression   Pt seen for OT evaluation this date. Upon arrival to room, pt awake and seated in recliner with daughter at bedside. Pt A&Ox2, complaining of no pain, and agreeable to evaluation. Prior to admission, daughter reports that pt was living in the SNF portion of [place], using a RW for functional mobility, and receiving some assistance for bathing and dressing. Pt presents with decreased strength and balance, and currently requiring SUPERVISION/SET-UP for seated grooming, MIN A for sit<>stand transfers, and CGA for functional mobility with RW. Pt would benefit from continued skilled OT services to improve activity tolerance, maximize independence with ADLs/functional mobility, and prevent further deconditioning. Upon discharge, recommend return to [facility] with f/u [facility] services.      Follow Up Recommendations  Home health OT    Equipment Recommendations  None recommended by OT       Precautions / Restrictions Precautions Precautions: Fall Restrictions Weight Bearing Restrictions: No      Mobility Bed Mobility               General bed mobility comments: not observed as pt received & left sitting in recliner    Transfers Overall transfer level: Needs assistance   Transfers: Sit to/from Stand Sit to Stand: Min assist         General transfer comment: Required MIN A for sit>stand and verbal/tactilr cues for hand placement on armrests for  stand>sit    Balance Overall balance assessment: Needs assistance           Standing balance-Leahy Scale: Poor Standing balance comment: BUE support on RW                           ADL either performed or assessed with clinical judgement   ADL Overall ADL's : Needs assistance/impaired     Grooming: Wash/dry face;Wash/dry hands;Supervision/safety;Set up;Sitting Grooming Details (indicate cue type and reason): Verbal cues for thoroughness                             Functional mobility during ADLs: Min guard;Rolling walker General ADL Comments: Pt able to engage both UE in grooming tasks; anticipate SUPERVISION-MIN A for all seated UB ADLs. Pt able to participate in functional mobility with RW (~120 feet)     Vision Patient Visual Report: No change from baseline              Pertinent Vitals/Pain Pain Assessment: No/denies pain        Extremity/Trunk Assessment Upper Extremity Assessment Upper Extremity Assessment: Generalized weakness (Shoulder flexion RUE 3/5 and LUE 3+/5)   Lower Extremity Assessment Lower Extremity Assessment: Defer to PT evaluation   Cervical / Trunk Assessment Cervical / Trunk Assessment: Normal   Communication Communication Communication: HOH (but per daughter pt "doesn't like to be yelled at")   Cognition Arousal/Alertness: Awake/alert Behavior During Therapy: WFL for tasks assessed/performed Overall Cognitive Status: History of cognitive impairments - at baseline Area of  Impairment: Orientation;Attention;Following commands;Safety/judgement;Awareness                 Orientation Level: Disoriented to;Time;Situation Current Attention Level: Sustained   Following Commands: Follows one step commands consistently Safety/Judgement: Decreased awareness of deficits Awareness: Intellectual       General Comments  BP at beginning of session 104/64 mmHg, Pt on 2L/min via nasal cannula during session, SpO2 91-92%  at end of session (unable to obtain accurate reading from pulse ox during mobility)            Home Living Family/patient expects to be discharged to:: Skilled nursing facility                                 Additional Comments: Per daughter, pt lives in SNF part of [place]      Prior Functioning/Environment Level of Independence: Needs assistance  Gait / Transfers Assistance Needed: Per daughter, pt uses RW for functional mobility, requiring supervision and some assistance for sit<>stand transfers ADL's / Homemaking Assistance Needed: Per daughter, pt needs some assistance during dressing and bathing   Comments: [name], [name], more restless & attempting to get up by himself at [facility].        OT Problem List: Decreased strength;Decreased activity tolerance;Decreased range of motion;Impaired balance (sitting and/or standing);Decreased safety awareness;Decreased knowledge of precautions      OT Treatment/Interventions: Self-care/ADL training;Therapeutic exercise;Energy conservation;DME and/or AE instruction;Therapeutic activities;Patient/family education;Balance training    OT Goals(Current goals can be found in the care plan section) Acute Rehab OT Goals Patient Stated Goal: return to [facility] OT Goal Formulation: With patient/family Time For Goal Achievement: 05/22/20 Potential to Achieve Goals: Good ADL Goals Pt Will Perform Grooming: with modified independence;standing Pt Will Perform Lower Body Dressing: with modified independence;sit to/from stand Pt Will Transfer to Toilet: with modified independence;ambulating;regular height toilet  OT Frequency: Min 1X/week           Co-evaluation   Reason for Co-Treatment: Necessary to address cognition/behavior during functional activity;For patient/therapist safety;To address functional/ADL transfers PT goals addressed during session:  Mobility/safety with mobility;Balance;Proper use of DME OT goals addressed during session: ADL's and self-care;Strengthening/ROM      AM-PAC OT "6 Clicks" Daily Activity     Outcome Measure Help from another person eating meals?: None Help from another person taking care of personal grooming?: A Little Help from another person toileting, which includes using toliet, bedpan, or urinal?: A Little Help from another person bathing (including washing, rinsing, drying)?: A Little Help from another person to put on and taking off regular upper body clothing?: A Little Help from another person to put on and taking off regular lower body clothing?: A Lot 6 Click Score: 18   End of Session Equipment Utilized During Treatment: Gait belt;Rolling walker Nurse Communication: Mobility status  Activity Tolerance: Patient tolerated treatment well Patient left: in chair;with call bell/phone within reach;with chair alarm set  OT Visit Diagnosis: [name];Muscle weakness (generalized) (M62.81)                Time: 0623-7628 OT Time Calculation (min): 30 min Charges:  OT General Charges $OT Visit: 1 Visit OT Evaluation $OT Eval Moderate Complexity: 1 Mod  Matthew Folks, OTR/L [facility] [phone]

## 2020-05-08 NOTE — Progress Notes (Signed)
[facility] Liaison note:  New referral for [employer] community based Palliative program to follow at the [facility] SN[facility] received from [facility]. Patient information given to referral.  Plan is for discharge today.  Thank you for this referral. Dayna Barker BSN, RN, [facility] Liaison [facility] [phone]

## 2020-05-08 NOTE — Progress Notes (Signed)
Report given to [facility], pt daughter at bedside updated , all iv removed, tele box removed, report given to ems. Pt transferred to stretcher tolerated well

## 2020-05-08 NOTE — Progress Notes (Signed)
Patient Name: Gary York Date of Encounter: 05/08/2020  Hospital Problem List     Principal Problem:   Axillary artery thrombosis, right [place]) Active Problems:   Occlusion of artery   Leukocytosis   Internal carotid artery occlusion, right   Limb ischemia    Patient Profile     87 y.o. male with history of CAD, SHB with ppm who presented ith acute right arm pain. Had a cold right hand with cyanosis. No history of trauma. Was on asa 81 mg daily, atorvastatin 80 daily, imdur 30 daily, metoprolol succ 25 mg daily and losartan 100 daily as outpatient. CXR reveled no pulmonary edema. CT of chest revealed no dissection or aneurysm but a long segment occlusion of the right axillary artery.Ct of right upper extremity revealed proximal occlusion or the right axillary artery  With no opacification of the right upper extremity. Pt underwent a right subclavian artery thromboembolectomy per vascular surgery. Had elevated troponin. No chest pain. EKG showed a pace, v paced rhythm. He feels less arm pain this am. No chest pain. Ruled in for nstemi with peak hsTROP of 8736. Denies chest pain  Subjective   No chest pain. Denies arm pain  Inpatient Medications    . apixaban  5 mg Oral BID  . aspirin EC  81 mg Oral Q0600  . atorvastatin  80 mg Oral q1800  . docusate sodium  100 mg Oral Daily  . fluticasone furoate-vilanterol  1 puff Inhalation Daily   And  . umeclidinium bromide  1 puff Inhalation Daily  . isosorbide mononitrate  30 mg Oral Daily  . losartan  100 mg Oral Daily  . metoprolol succinate  25 mg Oral Daily  . pantoprazole  40 mg Oral Daily    Vital Signs    Vitals:   05/08/20 0201 05/08/20 0350 05/08/20 0510 05/08/20 0731  BP: (!) 160/81 (!) 168/86 (!) 157/81 (!) 160/112  Pulse: 73 73 75 77  Resp: Temp:  97.8 F (36.6 C)    TempSrc:      SpO2: 98% 100%  100%  Weight: 82.1 kg     Height:        Intake/Output Summary (Last 24 hours) at 05/08/2020  0806 Last data filed at 05/08/2020 0500 Gross per 24 hour  Intake 0 ml  Output 400 ml  Net -400 ml   Filed Weights   05/06/20 1748 05/07/20 0404 05/08/20 0201  Weight: 80.2 kg 81 kg 82.1 kg    Physical Exam    GEN: Well nourished, well developed, in no acute distress.  HEENT: normal.  Neck: Supple, no JVD, carotid bruits, or masses. Cardiac: RRR, no murmurs, rubs, or gallops. No clubbing, cyanosis, edema.  Radials/DP/PT 2+ and equal bilaterally.  Respiratory:  Respirations regular and unlabored, clear to auscultation bilaterally. GI: Soft, nontender, nondistended, BS + x 4. MS: no deformity or atrophy. Skin: warm and dry, no rash. Neuro:  Strength and sensation are intact. Psych: Normal affect.  Labs    CBC Recent Labs    05/06/20 1751 05/07/20 0039 05/07/20 0844 05/08/20 0019  WBC 12.2*   < > 10.2 10.0  NEUTROABS 10.1*  --   --   --   HGB 13.1   < > 12.8* 13.1  HCT 38.6*   < > 38.4* 39.1  MCV 95.8   < > 98.2 97.8  PLT 245   < > 206 224   < > = values in this  interval not displayed.   Basic Metabolic Panel Recent Labs    29/56/21 0844 05/08/20 0019  NA 137 133*  K 4.2 4.2  CL 98 98  CO2 30 29  GLUCOSE 112* 121*  BUN 12 12  CREATININE 0.78 0.82  CALCIUM 8.7* 8.6*   Liver Function Tests No results for input(s): AST, ALT, ALKPHOS, BILITOT, PROT, ALBUMIN in the last 72 hours. No results for input(s): LIPASE, AMYLASE in the last 72 hours. Cardiac Enzymes No results for input(s): CKTOTAL, CKMB, CKMBINDEX, TROPONINI in the last 72 hours. BNP No results for input(s): BNP in the last 72 hours. D-Dimer No results for input(s): DDIMER in the last 72 hours. Hemoglobin A1C No results for input(s): HGBA1C in the last 72 hours. Fasting Lipid Panel No results for input(s): CHOL, HDL, LDLCALC, TRIG, CHOLHDL, LDLDIRECT in the last 72 hours. Thyroid Function Tests Recent Labs    05/06/20 2220  TSH 3.244    Telemetry    A pace,   ECG    A paced, v  paced  Radiology    CT ANGIO UP EXTREM RIGHT W &/OR WO CONTRAST  Result Date: 05/06/2020 CLINICAL DATA:  Cold right upper extremity EXAM: CT ANGIOGRAPHY UPPER RIGHT EXTREMITY TECHNIQUE: CT angiogram of the right upper extremity was performed following the intravenous administration of iodinated contrast. Multiplanar reformats were rendered and reviewed. CONTRAST:  OMNIPAQUE IOHEXOL 350 MG/ML SOLN COMPARISON:  None. FINDINGS: There is proximal occlusion of the right axillary artery, with no contrast opacification of the right upper extremity vasculature distal to this occlusion. The osseous structures and soft tissues of the right upper extremity are unremarkable. Review of the MIP images confirms the above findings. IMPRESSION: Proximal occlusion of the right axillary artery, with no contrast opacification of the right upper extremity vasculature distal to this occlusion. Electronically Signed   By: Lauralyn Primes M.D.   On: 05/06/2020 20:18   DG Chest Portable 1 View  Result Date: 05/06/2020 CLINICAL DATA:  Assess for widened mediastinum. EXAM: PORTABLE CHEST 1 VIEW COMPARISON:  02/18/2020 FINDINGS: Cardiac silhouette partly obscured by elevated hemidiaphragms. It appears mildly enlarged. No mediastinal widening. No evidence of a mediastinal or hilar mass. Low lung volumes with vascular congestion. There is additional opacity at the bases is likely due to atelectasis. Possible small effusions. No pneumothorax. Right anterior chest wall sequential pacemaker is stable. Skeletal structures grossly intact. IMPRESSION: 1. No mediastinal widening. 2. Vascular congestion without overt pulmonary edema. No evidence of pneumonia. 3. Additional lung base opacity that is likely atelectasis. Small effusions possible. Electronically Signed   By: Amie Portland M.D.   On: 05/06/2020 18:38   ECHOCARDIOGRAM COMPLETE BUBBLE STUDY  Result Date: 05/08/2020    ECHOCARDIOGRAM REPORT   Patient Name:   Gary York Date  of Exam: 05/07/2020 Medical Rec #:  [id number]     Height:       69.0 in Accession #:    [id number]    Weight:       178.5 lb Date of Birth:  1922-03-21     BSA:          1.969 m Patient Age:    87 years      BP:           1157/86 mmHg Patient Gender: M             HR:           75 bpm. Exam Location:  [facility] Procedure: 2D Echo and  Saline Contrast Bubble Study Indications:     Thromboembolism  History:         Patient has prior history of Echocardiogram examinations. CHF;                  Risk Factors:Hypertension.  Sonographer:     L Thornton-Maynard Referring Phys:  [id number] Ottie GlazierBRIAN L CHEN Diagnosing Phys: Harold HedgeKenneth Ashunti Schofield MD IMPRESSIONS  1. Left ventricular ejection fraction, by estimation, is 40 to 45%. The left ventricle has mildly decreased function. The left ventricle demonstrates regional wall motion abnormalities (see scoring diagram/findings for description). There is mild left ventricular hypertrophy. Left ventricular diastolic parameters are consistent with Grade I diastolic dysfunction (impaired relaxation).  2. Right ventricular systolic function is normal. The right ventricular size is mildly enlarged. There is mildly elevated pulmonary artery systolic pressure.  3. Left atrial size was mildly dilated.  4. The mitral valve is grossly normal. Trivial mitral valve regurgitation.  5. The aortic valve is calcified. Aortic valve regurgitation is trivial. Mild aortic valve stenosis. FINDINGS  Left Ventricle: Left ventricular ejection fraction, by estimation, is 40 to 45%. The left ventricle has mildly decreased function. The left ventricle demonstrates regional wall motion abnormalities. The left ventricular internal cavity size was normal in size. There is mild left ventricular hypertrophy. Left ventricular diastolic parameters are consistent with Grade I diastolic dysfunction (impaired relaxation).  LV Wall Scoring: The apical lateral segment, apical septal segment, and apex are akinetic. The mid anteroseptal segment,  mid inferolateral segment, mid anterolateral segment, and mid inferoseptal segment are hypokinetic. The basal anteroseptal segment, basal inferolateral segment, basal anterolateral segment, and basal inferoseptal segment are normal. Right Ventricle: The right ventricular size is mildly enlarged. No increase in right ventricular wall thickness. Right ventricular systolic function is normal. There is mildly elevated pulmonary artery systolic pressure. The tricuspid regurgitant velocity is 3.08 m/s, and with an assumed right atrial pressure of 3 mmHg, the estimated right ventricular systolic pressure is 40.9 mmHg. Left Atrium: Left atrial size was mildly dilated. Right Atrium: Right atrial size was normal in size. Pericardium: There is no evidence of pericardial effusion. Mitral Valve: The mitral valve is grossly normal. Trivial mitral valve regurgitation. Tricuspid Valve: The tricuspid valve is grossly normal. Tricuspid valve regurgitation is mild. Aortic Valve: The aortic valve is calcified. Aortic valve regurgitation is trivial. Mild aortic stenosis is present. Aortic valve mean gradient measures 12.0 mmHg. Aortic valve peak gradient measures 22.7 mmHg. Aortic valve area, by VTI measures 0.65 cm. Pulmonic Valve: The pulmonic valve was not well visualized. Pulmonic valve regurgitation is mild. Aorta: The aortic root is normal in size and structure. IAS/Shunts: No atrial level shunt detected by color flow Doppler. Agitated saline contrast was given intravenously to evaluate for intracardiac shunting. Additional Comments: A pacer wire is visualized.  LEFT VENTRICLE PLAX 2D LVIDd:         5.56 cm     Diastology LVIDs:         3.92 cm     LV e' medial:    3.49 cm/s LV PW:         1.10 cm     LV E/e' medial:  19.3 LV IVS:        1.43 cm     LV e' lateral:   4.50 cm/s LVOT diam:     2.10 cm     LV E/e' lateral: 15.0 LV SV:         32 LV SV Index:  16 LVOT Area:     3.46 cm  LV Volumes (MOD) LV vol d, MOD A2C: 83.1 ml LV  vol d, MOD A4C: 78.1 ml LV vol s, MOD A2C: 53.1 ml LV vol s, MOD A4C: 45.5 ml LV SV MOD A2C:     30.0 ml LV SV MOD A4C:     78.1 ml LV SV MOD BP:      32.7 ml RIGHT VENTRICLE RV S prime:     8.70 cm/s TAPSE (M-mode): 1.2 cm LEFT ATRIUM              Index       RIGHT ATRIUM           Index LA diam:        4.90 cm  2.49 cm/m  RA Area:     21.30 cm LA Vol (A2C):   112.0 ml 56.90 ml/m RA Volume:   61.60 ml  31.29 ml/m LA Vol (A4C):   80.5 ml  40.89 ml/m LA Biplane Vol: 97.0 ml  49.28 ml/m  AORTIC VALVE                    PULMONIC VALVE AV Area (Vmax):    0.68 cm     PV Vmax:       0.77 m/s AV Area (Vmean):   0.70 cm     PV Peak grad:  2.4 mmHg AV Area (VTI):     0.65 cm AV Vmax:           238.00 cm/s AV Vmean:          159.000 cm/s AV VTI:            0.501 m AV Peak Grad:      22.7 mmHg AV Mean Grad:      12.0 mmHg LVOT Vmax:         47.00 cm/s LVOT Vmean:        32.100 cm/s LVOT VTI:          0.093 m LVOT/AV VTI ratio: 0.19  AORTA Ao Root diam: 3.50 cm MITRAL VALVE               TRICUSPID VALVE MV Area (PHT): 3.06 cm    TR Peak grad:   37.9 mmHg MV E velocity: 67.30 cm/s  TR Vmax:        308.00 cm/s MV A velocity: 35.60 cm/s MV E/A ratio:  1.89        SHUNTS                            Systemic VTI:  0.09 m                            Systemic Diam: 2.10 cm Harold Hedge MD Electronically signed by Harold Hedge MD Signature Date/Time: 05/08/2020/7:52:24 AM    Final    CT Angio Chest/Abd/Pel for Dissection W and/or Wo Contrast  Result Date: 05/06/2020 CLINICAL DATA:  Cold right upper extremity EXAM: CT ANGIOGRAPHY CHEST, ABDOMEN AND PELVIS TECHNIQUE: Non-contrast CT of the chest was initially obtained. Multidetector CT imaging through the chest, abdomen and pelvis was performed using the standard protocol during bolus administration of intravenous contrast. Multiplanar reconstructed images and MIPs were obtained and reviewed to evaluate the vascular anatomy. CONTRAST:  OMNIPAQUE IOHEXOL 350 MG/ML SOLN  COMPARISON:  None. FINDINGS: CTA CHEST  FINDINGS Cardiovascular: Preferential opacification of the thoracic aorta. No evidence of thoracic aortic aneurysm or dissection. Aortic atherosclerosis. Normal contour and caliber of the thoracic aorta. No evidence of aneurysm, dissection, or other acute aortic pathology. 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 Cardiomegaly. Three-vessel coronary artery calcifications and/or stents. No pericardial effusion. Mediastinum/Nodes: No enlarged mediastinal, hilar, or axillary lymph nodes. Thyroid gland, trachea, and esophagus demonstrate no significant findings. Lungs/Pleura: Moderate right, small left pleural effusions and associated atelectasis or consolidation. Interlobular septal thickening throughout. Musculoskeletal: No chest wall abnormality. No acute or significant osseous findings. Review of the MIP images confirms the above findings. CTA ABDOMEN AND PELVIS FINDINGS VASCULAR Normal contour and caliber of the abdominal aorta without evidence of aneurysm, dissection, or other acute aortic pathology. Standard branching pattern of the abdominal aorta with solitary bilateral renal arteries. Atherosclerosis at the branch vessel origins without significant stenosis. Review of the MIP images confirms the above findings. NON-VASCULAR Hepatobiliary: No solid liver abnormality is seen. No gallstones, gallbladder wall thickening, or biliary dilatation. Pancreas: Unremarkable. No pancreatic ductal dilatation or surrounding inflammatory changes. Spleen: Normal in size without significant abnormality. Adrenals/Urinary Tract: Adrenal glands are unremarkable. Exophytic cysts of the right kidney. Kidneys are otherwise normal, without renal calculi, solid lesion, or hydronephrosis. Bladder is unremarkable. Stomach/Bowel: Stomach is within normal limits. Appendix appears normal. No evidence of bowel wall thickening,  distention, or inflammatory changes. Lymphatic: No enlarged abdominal or pelvic lymph nodes. Reproductive: No mass or other significant abnormality. Other: No abdominal wall hernia or abnormality. No abdominopelvic ascites. Musculoskeletal: No acute or significant osseous findings. 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 Review of the MIP images confirms the above findings. CTA RIGHT UPPER EXTREMITY FINDINGS: There is proximal occlusion of the right axillary artery, with no opacification of the right upper extremity vasculature distal to this occlusion (series 5, image 72). IMPRESSION: 1. Normal contour and caliber of the thoracic and abdominal aorta without evidence of aneurysm, dissection, or other acute aortic pathology. 2. 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 3. Proximal occlusion of the right axillary artery, with no opacification of the right upper extremity vasculature distal to this occlusion. 4. Moderate right, small left pleural effusions and associated atelectasis or consolidation. Mild pulmonary edema. 5. Cardiomegaly and coronary artery disease. Electronically Signed   By: Lauralyn Primes M.D.   On: 05/06/2020 19:40    Assessment & Plan    87 year old male with history of complete heart block status post dual-chamber pacemaker presented with acute onset right arm pain was noted to have a right subclavian artery occlusion.  Underwent thrombectomy.  Denied chest pain.  EKG showed atrial paced ventricular paced rhythm.  Has been placed on heparin.  Serum troponin drawn in the ER was initially 361 and subsequently 593.  Currently remains on heparin with a hematoma at the access site.  Feelings from vascular surgery was low risk for compartment syndrome.  The fascia was not reapproximated post procedure.  1.  Right subclavian artery occlusion-status post thrombectomy.  On heparin.  Small hematoma at the site.   Further anticoagulation therapy discussion was requested.  Patient is a sensed V paced.  Heparin was continued due to elevated troponin.  Patient has no chest pain.  EKG not helpful due to the paced rhythm.  Etiology unclear. May be embolic from apex of heart but no apical  thrombus noted. .  Patient is not an ideal candidate for invasive cardiac evaluation at this point.  Will remain off of heparin and treat with eliuis 5 mg bid and asa 81 daily  2.  Elevated troponin-Echo showed mild to mod lv dysfunction with ant/apical akinesis. Unclear chronicity but may be chronic vs acute. Not candidate for cath. Will treat as above.   Signed, Darlin Priestly Jimia Gentles MD 05/08/2020, 8:06 AM  Pager: [phone]

## 2020-05-08 NOTE — [place] FL2 (Signed)
MEDICAID FL2 LEVEL OF CARE SCREENING TOOL     IDENTIFICATION  Patient Name: Gary York Birthdate: 31-Jul-1921 Sex: male Admission Date (Current Location): 05/06/2020  [facility] and [place] Number:  Chiropodist and Address:         Provider Number: [id number]  Attending Physician Name and Address:  Arnetha Courser, MD  Relative Name and Phone Number:       Current Level of Care: Hospital Recommended Level of Care: Skilled Nursing Facility Prior Approval Number:    Date Approved/Denied:   PASRR Number:    Discharge Plan: SNF    Current Diagnoses: Patient Active Problem List   Diagnosis Date Noted  . Limb ischemia 05/07/2020  . Occlusion of artery 05/06/2020  . 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 Leukocytosis 05/06/2020  . Internal carotid artery occlusion, right 05/06/2020  . Pneumonia 02/19/2020  . COPD exacerbation (HCC) 02/18/2020  . Hyperglycemia 02/18/2020  . Chronic systolic CHF (congestive heart failure) (HCC) 02/18/2020  . Aortic stenosis 02/18/2020  . Hyponatremia 02/18/2020  . Hypocalcemia 02/18/2020  . Normocytic anemia 02/18/2020  . Hyperlipidemia   . Pain due to onychomycosis of toenails of both feet 01/21/2019  . CHF (congestive heart failure) (HCC) 09/16/2017  . Chest pain 09/09/2017    Orientation RESPIRATION BLADDER Height & Weight     Self,Place  O2 (2L [place]) Incontinent,External catheter Weight: 82.1 kg Height:  5\' 9"  (175.3 cm)  BEHAVIORAL SYMPTOMS/MOOD NEUROLOGICAL BOWEL NUTRITION STATUS      Continent Diet (Heart Healthy)  AMBULATORY STATUS COMMUNICATION OF NEEDS Skin   Limited Assist Verbally Surgical wounds                       Personal Care Assistance Level of Assistance              Functional Limitations Info             SPECIAL CARE FACTORS FREQUENCY                       Contractures Contractures Info: Not present    Additional Factors Info  Code  Status,Allergies Code Status Info: DNR Allergies Info: NKDA           Current Medications (05/08/2020):  This is the current hospital active medication list Current Facility-Administered Medications  Medication Dose Route Frequency Provider Last Rate Last Admin  . 0.9 %  sodium chloride infusion  500 mL Intravenous Once PRN 05/10/2020, MD      . 0.9 %  sodium chloride infusion   Intravenous Continuous Fransisco Hertz, MD 50 mL/hr at 05/07/20 0001 New Bag at 05/07/20 0001  . acetaminophen (TYLENOL) tablet 325 mg  325 mg Oral Q6H PRN Cox, Amy N, DO       Or  . acetaminophen (TYLENOL) suppository 325 mg  325 mg Rectal Q6H PRN Cox, Amy N, DO      . albuterol (PROVENTIL) (2.5 MG/3ML) 0.083% nebulizer solution 2.5 mg  2.5 mg Nebulization Q6H PRN Cox, Amy N, DO      . alum & mag hydroxide-simeth (MAALOX/MYLANTA) 200-200-20 MG/5ML suspension 15-30 mL  15-30 mL Oral Q2H PRN 07-16-1974, MD      . apixaban Fransisco Hertz) tablet 5 mg  5 mg Oral BID Everlene Balls, MD   5 mg at 05/08/20 0841  . aspirin EC tablet 81 mg  81 mg Oral Q0600 05/10/20, MD  81 mg at 05/08/20 0649  . atorvastatin (LIPITOR) tablet 80 mg  80 mg Oral q1800 Cox, Amy N, DO   80 mg at 05/07/20 1648  . bisacodyl (DULCOLAX) suppository 10 mg  10 mg Rectal Daily PRN Fransisco Hertz, MD      . docusate sodium (COLACE) capsule 100 mg  100 mg Oral Daily Fransisco Hertz, MD   100 mg at 05/08/20 0841  . fluticasone furoate-vilanterol (BREO ELLIPTA) 100-25 MCG/INH 1 puff  1 puff Inhalation Daily Belue, Lendon Collar, RPH       And  . umeclidinium bromide (INCRUSE ELLIPTA) 62.5 MCG/INH 1 puff  1 puff Inhalation Daily Otelia Sergeant, RPH      . guaiFENesin-dextromethorphan (ROBITUSSIN DM) 100-10 MG/5ML syrup 15 mL  15 mL Oral Q4H PRN Fransisco Hertz, MD      . hydrALAZINE (APRESOLINE) injection 5 mg  5 mg Intravenous Q20 Min PRN Fransisco Hertz, MD      . isosorbide mononitrate (IMDUR) 24 hr tablet 30 mg  30 mg Oral Daily Cox, Amy N, DO   30 mg at  05/08/20 0841  . labetalol (NORMODYNE) injection 10 mg  10 mg Intravenous Q10 min PRN Fransisco Hertz, MD   10 mg at 05/08/20 0418  . losartan (COZAAR) tablet 100 mg  100 mg Oral Daily Arnetha Courser, MD   100 mg at 05/08/20 0841  . magnesium sulfate IVPB 2 g 50 mL  2 g Intravenous Daily PRN Fransisco Hertz, MD      . metoprolol succinate (TOPROL-XL) 24 hr tablet 25 mg  25 mg Oral Daily Cox, Amy N, DO   25 mg at 05/08/20 0841  . metoprolol tartrate (LOPRESSOR) injection 2-5 mg  2-5 mg Intravenous Q2H PRN Fransisco Hertz, MD      . morphine 2 MG/ML injection 2-5 mg  2-5 mg Intravenous Q1H PRN Fransisco Hertz, MD      . nitroGLYCERIN (NITROSTAT) SL tablet 0.4 mg  0.4 mg Sublingual Q5 min PRN Cox, Amy N, DO      . ondansetron (ZOFRAN) tablet 4 mg  4 mg Oral Q6H PRN Cox, Amy N, DO       Or  . ondansetron (ZOFRAN) injection 4 mg  4 mg Intravenous Q6H PRN Cox, Amy N, DO      . oxyCODONE (Oxy IR/ROXICODONE) immediate release tablet 5-10 mg  5-10 mg Oral Q4H PRN Fransisco Hertz, MD      . pantoprazole (PROTONIX) EC tablet 40 mg  40 mg Oral Daily Cox, Amy N, DO   40 mg at 05/08/20 0841  . phenol (CHLORASEPTIC) mouth spray 1 spray  1 spray Mouth/Throat PRN Fransisco Hertz, MD      . polyethylene glycol (MIRALAX / GLYCOLAX) packet 17 g  17 g Oral Daily PRN Arnetha Courser, MD      . potassium chloride SA (KLOR-CON) CR tablet 20-40 mEq  20-40 mEq Oral Daily PRN Fransisco Hertz, MD      . senna-docusate (Senokot-S) tablet 1 tablet  1 tablet Oral QHS PRN Fransisco Hertz, MD      . sodium phosphate (FLEET) 7-19 GM/118ML enema 1 enema  1 enema Rectal Once PRN Fransisco Hertz, MD         Discharge Medications: Please see discharge summary for a list of discharge medications.  Relevant Imaging Results:  Relevant Lab Results:   Additional Information ss [id number]  Chapman Fitch, RN

## 2020-05-08 NOTE — Discharge Summary (Signed)
Physician Discharge Summary  Gary York:[id number]:[id number] DOB: June 21, 1921 DOA: 05/06/2020  PCP: Marisue IvanLinthavong, Kanhka, MD  Admit date: 05/06/2020 Discharge date: 05/08/2020  Admitted From: SNF Disposition: SNF  Recommendations for Outpatient Follow-up:  1. Follow up with PCP in 1-2 weeks 2. Follow-up with cardiology within 1 week 3. Please obtain BMP/CBC in one week 4. Please follow up on the following pending results: None  Home Health: No Equipment/Devices: Discharge Condition: Stable CODE STATUS: DNR Diet recommendation: Heart Healthy / Carb Modified   Brief/Interim Summary: Gary York a 87 y.o.malewith medical history significant forheart failure reduced ejection fraction, hypertension, debility, advanced dementia, presents to the emergency room for chief concerns of right upper extremity pain and pulselessness. She lives in a facility. CTA chest, abdomen and pelvis was positive for long segment occlusion of left right internal carotid artery near the origin, proximal occlusion of right axillary artery and supraclavicular artery. No aneurysm or dissection was noted.  Vascular surgery was consulted and he was taken to the OR for embolectomy to preserve the limb. Successful restoration of pulses after procedure. Cardiology was consulted for concern of cardiac source of emboli and echocardiogram was done. Which was negative for any cardiac thrombi/emboli. It was negative for any shunting. EF of 35-40%. Paced rhythm, anteroapical hypokinesia. Troponin was elevated and peaked at 8736 which makes it in the range of NSTEMI.  No chest pain.  May be a emboli affecting coronary arteries.  He was not a candidate for aggressive measures secondary to his advanced age, multiple comorbidities and poor functional status.  Cardiology was consulted and they are recommending medical Management and will follow up as an outpatient.  Patient was started on Eliquis for arterial thrombosis and he will  continue Eliquis along with his home dose of aspirin.  He will be high risk for any acute bleeding.  Currently benefit outweighs the risk.  Discussed with daughter.  He was also given a referral to see a palliative care as an outpatient for symptom management and goals of care.  He will continue rest of his home medications.  High risk for deterioration and death.  Discharge Diagnoses:  Principal Problem:   Axillary artery thrombosis, right (HCC) Active Problems:   Occlusion of artery   Leukocytosis   Internal carotid artery occlusion, right   Limb ischemia   Discharge Instructions  Discharge Instructions    Amb Referral to Palliative Care   Complete by: As directed    Diet - low sodium heart healthy   Complete by: As directed    Increase activity slowly   Complete by: As directed    Increase activity slowly   Complete by: As directed    No wound care   Complete by: As directed    No wound care   Complete by: As directed      Allergies as of 05/08/2020   No Known Allergies     Medication List    TAKE these medications   acetaminophen 500 MG tablet Commonly known as: TYLENOL Take 500 mg by mouth every 6 (six) hours as needed for mild pain, moderate pain or fever.   albuterol (2.5 MG/3ML) 0.083% nebulizer solution Commonly known as: PROVENTIL Take 2.5 mg by nebulization every 6 (six) hours as needed for wheezing or shortness of breath.   albuterol 108 (90 Base) MCG/ACT inhaler Commonly known as: VENTOLIN HFA Inhale 2 puffs into the lungs every 6 (six) hours as needed for wheezing or shortness of breath.   apixaban  5 MG Tabs tablet Commonly known as: ELIQUIS Take 1 tablet (5 mg total) by mouth 2 (two) times daily.   aspirin 81 MG EC tablet Take 1 tablet (81 mg total) by mouth daily.   atorvastatin 80 MG tablet Commonly known as: LIPITOR Take 1 tablet (80 mg total) by mouth daily at 6 PM.   dextromethorphan-guaiFENesin 30-600 MG 12hr tablet Commonly known  as: MUCINEX DM Take 1 tablet by mouth at bedtime.   docusate sodium 100 MG capsule Commonly known as: COLACE Take 100 mg by mouth daily.   isosorbide mononitrate 30 MG 24 hr tablet Commonly known as: IMDUR Take 30 mg by mouth daily.   losartan 100 MG tablet Commonly known as: COZAAR Take 100 mg by mouth daily.   magnesium hydroxide 400 MG/5ML suspension Commonly known as: MILK OF MAGNESIA Take 30 mLs by mouth at bedtime as needed for mild constipation.   metoprolol succinate 25 MG 24 hr tablet Commonly known as: TOPROL-XL Take 25 mg by mouth daily.   nitroGLYCERIN 0.4 MG SL tablet Commonly known as: NITROSTAT Place 0.4 mg under the tongue every 5 (five) minutes as needed for chest pain.   pantoprazole 40 MG tablet Commonly known as: PROTONIX Take 40 mg by mouth daily.   polyethylene glycol 17 g packet Commonly known as: MIRALAX / GLYCOLAX Take 17 g by mouth daily as needed. What changed: reasons to take this   senna 8.6 MG Tabs tablet Commonly known as: SENOKOT Take 1 tablet by mouth daily.   sertraline 25 MG tablet Commonly known as: ZOLOFT Take 25 mg by mouth daily.   Trelegy Ellipta 100-62.5-25 MCG/INH Aepb Generic drug: Fluticasone-Umeclidin-Vilant Inhale 1 puff into the lungs daily.       Follow-up Information    Dalia Heading, MD Follow up in 1 week(s).   Specialty: Cardiology Contact information: [address] [place] [address] [phone]        Marisue Ivan, MD. Schedule an appointment as soon as possible for a visit.   Specialty: Family Medicine Contact information: [address] [facility] [place] [address] [phone]              No Known Allergies  Consultations:  Vascular surgery  Cardiology  Procedures/Studies: CT ANGIO UP EXTREM RIGHT W &/OR WO CONTRAST  Result Date: 05/06/2020 CLINICAL DATA:  Cold right upper extremity EXAM: CT ANGIOGRAPHY UPPER RIGHT EXTREMITY TECHNIQUE:  CT angiogram of the right upper extremity was performed following the intravenous administration of iodinated contrast. Multiplanar reformats were rendered and reviewed. CONTRAST:  OMNIPAQUE IOHEXOL 350 MG/ML SOLN COMPARISON:  None. FINDINGS: There is proximal occlusion of the right axillary artery, with no contrast opacification of the right upper extremity vasculature distal to this occlusion. The osseous structures and soft tissues of the right upper extremity are unremarkable. Review of the MIP images confirms the above findings. IMPRESSION: Proximal occlusion of the right axillary artery, with no contrast opacification of the right upper extremity vasculature distal to this occlusion. Electronically Signed   By: Lauralyn Primes M.D.   On: 05/06/2020 20:18   DG Chest Portable 1 View  Result Date: 05/06/2020 CLINICAL DATA:  Assess for widened mediastinum. EXAM: PORTABLE CHEST 1 VIEW COMPARISON:  02/18/2020 FINDINGS: Cardiac silhouette partly obscured by elevated hemidiaphragms. It appears mildly enlarged. No mediastinal widening. No evidence of a mediastinal or hilar mass. Low lung volumes with vascular congestion. There is additional opacity at the bases is likely due to atelectasis.  Possible small effusions. No pneumothorax. Right anterior chest wall sequential pacemaker is stable. Skeletal structures grossly intact. IMPRESSION: 1. No mediastinal widening. 2. Vascular congestion without overt pulmonary edema. No evidence of pneumonia. 3. Additional lung base opacity that is likely atelectasis. Small effusions possible. Electronically Signed   By: David  OrmoAmie PortlandOn: 05/06/2020 18:38   ECHOCARDIOGRAM COMPLETE BUBBLE STUDY  Result Date: 05/08/2020    ECHOCARDIOGRAM REPORT   Patient Name:   MARQUON ALCALA Date of Exam: 05/07/2020 Medical Rec #:  [id number]     Height:       69.0 in Accession #:    [id number]    Weight:       178.5 lb Date of Birth:  Sep 27, 1921     BSA:          1.969 m Patient Age:     87 years      BP:           1157/86 mmHg Patient Gender: M             HR:           75 bpm. Exam Location:  [facility] Procedure: 2D Echo and Saline Contrast Bubble Study Indications:     Thromboembolism  History:         Patient has prior history of Echocardiogram examinations. CHF;                  Risk Factors:Hypertension.  Sonographer:     L Thornton-Maynard Referring Phys:  [id number] Ottie Glazier CHEN Diagnosing Phys: Harold Hedge MD IMPRESSIONS  1. Left ventricular ejection fraction, by estimation, is 40 to 45%. The left ventricle has mildly decreased function. The left ventricle demonstrates regional wall motion abnormalities (see scoring diagram/findings for description). There is mild left ventricular hypertrophy. Left ventricular diastolic parameters are consistent with Grade I diastolic dysfunction (impaired relaxation).  2. Right ventricular systolic function is normal. The right ventricular size is mildly enlarged. There is mildly elevated pulmonary artery systolic pressure.  3. Left atrial size was mildly dilated.  4. The mitral valve is grossly normal. Trivial mitral valve regurgitation.  5. The aortic valve is calcified. Aortic valve regurgitation is trivial. Mild aortic valve stenosis. FINDINGS  Left Ventricle: Left ventricular ejection fraction, by estimation, is 40 to 45%. The left ventricle has mildly decreased function. The left ventricle demonstrates regional wall motion abnormalities. The left ventricular internal cavity size was normal in size. There is mild left ventricular hypertrophy. Left ventricular diastolic parameters are consistent with Grade I diastolic dysfunction (impaired relaxation).  LV Wall Scoring: The apical lateral segment, apical septal segment, and apex are akinetic. The mid anteroseptal segment, mid inferolateral segment, mid anterolateral segment, and mid inferoseptal segment are hypokinetic. The basal anteroseptal segment, basal inferolateral segment, basal anterolateral segment, and  basal inferoseptal segment are normal. Right Ventricle: The right ventricular size is mildly enlarged. No increase in right ventricular wall thickness. Right ventricular systolic function is normal. There is mildly elevated pulmonary artery systolic pressure. The tricuspid regurgitant velocity is 3.08 m/s, and with an assumed right atrial pressure of 3 mmHg, the estimated right ventricular systolic pressure is 40.9 mmHg. Left Atrium: Left atrial size was mildly dilated. Right Atrium: Right atrial size was normal in size. Pericardium: There is no evidence of pericardial effusion. Mitral Valve: The mitral valve is grossly normal. Trivial mitral valve regurgitation. Tricuspid Valve: The tricuspid valve is grossly normal. Tricuspid valve regurgitation is mild. Aortic Valve: The aortic  valve is calcified. Aortic valve regurgitation is trivial. Mild aortic stenosis is present. Aortic valve mean gradient measures 12.0 mmHg. Aortic valve peak gradient measures 22.7 mmHg. Aortic valve area, by VTI measures 0.65 cm. Pulmonic Valve: The pulmonic valve was not well visualized. Pulmonic valve regurgitation is mild. Aorta: The aortic root is normal in size and structure. IAS/Shunts: No atrial level shunt detected by color flow Doppler. Agitated saline contrast was given intravenously to evaluate for intracardiac shunting. Additional Comments: A pacer wire is visualized.  LEFT VENTRICLE PLAX 2D LVIDd:         5.56 cm     Diastology LVIDs:         3.92 cm     LV e' medial:    3.49 cm/s LV PW:         1.10 cm     LV E/e' medial:  19.3 LV IVS:        1.43 cm     LV e' lateral:   4.50 cm/s LVOT diam:     2.10 cm     LV E/e' lateral: 15.0 LV SV:         32 LV SV Index:   16 LVOT Area:     3.46 cm  LV Volumes (MOD) LV vol d, MOD A2C: 83.1 ml LV vol d, MOD A4C: 78.1 ml LV vol s, MOD A2C: 53.1 ml LV vol s, MOD A4C: 45.5 ml LV SV MOD A2C:     30.0 ml LV SV MOD A4C:     78.1 ml LV SV MOD BP:      32.7 ml RIGHT VENTRICLE RV S prime:      8.70 cm/s TAPSE (M-mode): 1.2 cm LEFT ATRIUM              Index       RIGHT ATRIUM           Index LA diam:        4.90 cm  2.49 cm/m  RA Area:     21.30 cm LA Vol (A2C):   112.0 ml 56.90 ml/m RA Volume:   61.60 ml  31.29 ml/m LA Vol (A4C):   80.5 ml  40.89 ml/m LA Biplane Vol: 97.0 ml  49.28 ml/m  AORTIC VALVE                    PULMONIC VALVE AV Area (Vmax):    0.68 cm     PV Vmax:       0.77 m/s AV Area (Vmean):   0.70 cm     PV Peak grad:  2.4 mmHg AV Area (VTI):     0.65 cm AV Vmax:           238.00 cm/s AV Vmean:          159.000 cm/s AV VTI:            0.501 m AV Peak Grad:      22.7 mmHg AV Mean Grad:      12.0 mmHg LVOT Vmax:         47.00 cm/s LVOT Vmean:        32.100 cm/s LVOT VTI:          0.093 m LVOT/AV VTI ratio: 0.19  AORTA Ao Root diam: 3.50 cm MITRAL VALVE               TRICUSPID VALVE MV Area (PHT): 3.06 cm    TR Peak grad:  37.9 mmHg MV E velocity: 67.30 cm/s  TR Vmax:        308.00 cm/s MV A velocity: 35.60 cm/s MV E/A ratio:  1.89        SHUNTS                            Systemic VTI:  0.09 m                            Systemic Diam: 2.10 cm Harold Hedge MD Electronically signed by Harold Hedge MD Signature Date/Time: 05/08/2020/7:52:24 AM    Final    CT Angio Chest/Abd/Pel for Dissection W and/or Wo Contrast  Result Date: 05/06/2020 CLINICAL DATA:  Cold right upper extremity EXAM: CT ANGIOGRAPHY CHEST, ABDOMEN AND PELVIS TECHNIQUE: Non-contrast CT of the chest was initially obtained. Multidetector CT imaging through the chest, abdomen and pelvis was performed using the standard protocol during bolus administration of intravenous contrast. Multiplanar reconstructed images and MIPs were obtained and reviewed to evaluate the vascular anatomy. CONTRAST:  OMNIPAQUE IOHEXOL 350 MG/ML SOLN COMPARISON:  None. FINDINGS: CTA CHEST FINDINGS Cardiovascular: Preferential opacification of the thoracic aorta. No evidence of thoracic aortic aneurysm or dissection. Aortic atherosclerosis.  Normal contour and caliber of the thoracic aorta. No evidence of aneurysm, dissection, or other acute aortic pathology. 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 Cardiomegaly. Three-vessel coronary artery calcifications and/or stents. No pericardial effusion. Mediastinum/Nodes: No enlarged mediastinal, hilar, or axillary lymph nodes. Thyroid gland, trachea, and esophagus demonstrate no significant findings. Lungs/Pleura: Moderate right, small left pleural effusions and associated atelectasis or consolidation. Interlobular septal thickening throughout. Musculoskeletal: No chest wall abnormality. No acute or significant osseous findings. Review of the MIP images confirms the above findings. CTA ABDOMEN AND PELVIS FINDINGS VASCULAR Normal contour and caliber of the abdominal aorta without evidence of aneurysm, dissection, or other acute aortic pathology. Standard branching pattern of the abdominal aorta with solitary bilateral renal arteries. Atherosclerosis at the branch vessel origins without significant stenosis. Review of the MIP images confirms the above findings. NON-VASCULAR Hepatobiliary: No solid liver abnormality is seen. No gallstones, gallbladder wall thickening, or biliary dilatation. Pancreas: Unremarkable. No pancreatic ductal dilatation or surrounding inflammatory changes. Spleen: Normal in size without significant abnormality. Adrenals/Urinary Tract: Adrenal glands are unremarkable. Exophytic cysts of the right kidney. Kidneys are otherwise normal, without renal calculi, solid lesion, or hydronephrosis. Bladder is unremarkable. Stomach/Bowel: Stomach is within normal limits. Appendix appears normal. No evidence of bowel wall thickening, distention, or inflammatory changes. Lymphatic: No enlarged abdominal or pelvic lymph nodes. Reproductive: No mass or other significant abnormality. Other: No abdominal wall hernia or abnormality.  No abdominopelvic ascites. Musculoskeletal: No acute or significant osseous findings. 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 Review of the MIP images confirms the above findings. CTA RIGHT UPPER EXTREMITY FINDINGS: There is proximal occlusion of the right axillary artery, with no opacification of the right upper extremity vasculature distal to this occlusion (series 5, image 72). IMPRESSION: 1. Normal contour and caliber of the thoracic and abdominal aorta without evidence of aneurysm, dissection, or other acute aortic pathology. 2. 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 3. Proximal occlusion of the right axillary artery, with no opacification of the right upper extremity vasculature distal to this occlusion.  4. Moderate right, small left pleural effusions and associated atelectasis or consolidation. Mild pulmonary edema. 5. Cardiomegaly and coronary artery disease. Electronically Signed   By: Lauralyn Primes M.D.   On: 05/06/2020 19:40    Subjective: Patient was seen and examined today.  Sitting comfortably in chair.  Denies any chest pain or right upper extremity pain.  Pulses intact. Daughter at bedside.  Discussed about increased risk of bleeding with Eliquis and aspirin together .  Discharge Exam: Vitals:   05/08/20 0510 05/08/20 0731  BP: (!) 157/81 (!) 160/112  Pulse: 75 77  Resp: 20 20  Temp:    SpO2:  100%   Vitals:   05/08/20 0201 05/08/20 0350 05/08/20 0510 05/08/20 0731  BP: (!) 160/81 (!) 168/86 (!) 157/81 (!) 160/112  Pulse: 73 73 75 77  Resp: 18 18 20 20   Temp:  97.8 F (36.6 C)    TempSrc:      SpO2: 98% 100%  100%  Weight: 82.1 kg     Height:        General: Pt is alert, awake, not in acute distress Cardiovascular: RRR, S1/S2 +, no rubs, no gallops Respiratory: CTA bilaterally, no wheezing, no rhonchi Abdominal: Soft, NT, ND, bowel sounds + Extremities: no edema, no  cyanosis   The results of significant diagnostics from this hospitalization (including imaging, microbiology, ancillary and laboratory) are listed below for reference.    Microbiology: Recent Results (from the past 240 hour(s))  Resp Panel by RT-PCR (Flu A&B, Covid) Nasopharyngeal Swab     Status: None   Collection Time: 05/06/20  5:51 PM   Specimen: Nasopharyngeal Swab; Nasopharyngeal(NP) swabs in vial transport medium  Result Value Ref Range Status   SARS Coronavirus 2 by RT PCR NEGATIVE NEGATIVE Final    Comment: (NOTE) SARS-CoV-2 target nucleic acids are NOT DETECTED.  The SARS-CoV-2 RNA is generally detectable in upper respiratory specimens during the acute phase of infection. The lowest concentration of SARS-CoV-2 viral copies this assay can detect is 138 copies/mL. A negative result does not preclude SARS-Cov-2 infection and should not be used as the sole basis for treatment or other patient management decisions. A negative result may occur with  improper specimen collection/handling, submission of specimen other than nasopharyngeal swab, presence of viral mutation(s) within the areas targeted by this assay, and inadequate number of viral copies(<138 copies/mL). A negative result must be combined with clinical observations, patient history, and epidemiological information. The expected result is Negative.  Fact Sheet for Patients:  07/04/20  Fact Sheet for Healthcare Providers:  BloggerCourse.com  This test is no t yet approved or cleared by the [employer] FDA and  has been authorized for detection and/or diagnosis of SARS-CoV-2 by FDA under an Emergency Use Authorization (EUA). This EUA will remain  in effect (meaning this test can be used) for the duration of the COVID-19 declaration under Section 564(b)(1) of the Act, 21 U.S.C.section 360bbb-3(b)(1), unless the authorization is terminated  or revoked sooner.        Influenza A by PCR NEGATIVE NEGATIVE Final   Influenza B by PCR NEGATIVE NEGATIVE Final    Comment: (NOTE) The Xpert Xpress SARS-CoV-2/FLU/RSV plus assay is intended as an aid in the diagnosis of influenza from Nasopharyngeal swab specimens and should not be used as a sole basis for treatment. Nasal washings and aspirates are unacceptable for Xpert Xpress SARS-CoV-2/FLU/RSV testing.  Fact Sheet for Patients: [place]  Fact Sheet for Healthcare Providers: BloggerCourse.com  This test is not  yet approved or cleared by the [place] and has been authorized for detection and/or diagnosis of SARS-CoV-2 by FDA under an Emergency Use Authorization (EUA). This EUA will remain in effect (meaning this test can be used) for the duration of the COVID-19 declaration under Section 564(b)(1) of the Act, 21 U.S.C. section 360bbb-3(b)(1), unless the authorization is terminated or revoked.  Performed at [facility], [address] [place], [place] [address]      Labs: BNP (last 3 results) No results for input(s): BNP in the last 8760 hours. Basic Metabolic Panel: Recent Labs  Lab 05/06/20 1751 05/07/20 0844 05/08/20 0019  NA 134* 137 133*  K 3.5 4.2 4.2  CL 99 98 98  CO2 23 30 29   GLUCOSE 233* 112* 121*  BUN 17 12 12   CREATININE 0.81 0.78 0.82  CALCIUM 8.8* 8.7* 8.6*   Liver Function Tests: No results for input(s): AST, ALT, ALKPHOS, BILITOT, PROT, ALBUMIN in the last 168 hours. No results for input(s): LIPASE, AMYLASE in the last 168 hours. No results for input(s): AMMONIA in the last 168 hours. CBC: Recent Labs  Lab 05/06/20 1751 05/07/20 0039 05/07/20 0844 05/08/20 0019  WBC 12.2* 12.7* 10.2 10.0  NEUTROABS 10.1*  --   --   --   HGB 13.1 12.7* 12.8* 13.1  HCT 38.6* 38.4* 38.4* 39.1  MCV 95.8 99.0 98.2 97.8  PLT 245 214 206 224   Cardiac Enzymes: No results for input(s): CKTOTAL,  CKMB, CKMBINDEX, TROPONINI in the last 168 hours. BNP: Invalid input(s): POCBNP CBG: No results for input(s): GLUCAP in the last 168 hours. D-Dimer No results for input(s): DDIMER in the last 72 hours. Hgb A1c No results for input(s): HGBA1C in the last 72 hours. Lipid Profile No results for input(s): CHOL, HDL, LDLCALC, TRIG, CHOLHDL, LDLDIRECT in the last 72 hours. Thyroid function studies Recent Labs    05/06/20 2220  TSH 3.244   Anemia work up No results for input(s): VITAMINB12, FOLATE, FERRITIN, TIBC, IRON, RETICCTPCT in the last 72 hours. Urinalysis    Component Value Date/Time   COLORURINE STRAW (A) 09/03/2017 0904   APPEARANCEUR CLEAR (A) 09/03/2017 0904   LABSPEC 1.010 09/03/2017 0904   PHURINE 7.0 09/03/2017 0904   GLUCOSEU NEGATIVE 09/03/2017 0904   HGBUR NEGATIVE 09/03/2017 0904   BILIRUBINUR NEGATIVE 09/03/2017 0904   KETONESUR NEGATIVE 09/03/2017 0904   PROTEINUR NEGATIVE 09/03/2017 0904   NITRITE NEGATIVE 09/03/2017 0904   LEUKOCYTESUR NEGATIVE 09/03/2017 0904   Sepsis Labs Invalid input(s): PROCALCITONIN,  WBC,  LACTICIDVEN Microbiology Recent Results (from the past 240 hour(s))  Resp Panel by RT-PCR (Flu A&B, Covid) Nasopharyngeal Swab     Status: None   Collection Time: 05/06/20  5:51 PM   Specimen: Nasopharyngeal Swab; Nasopharyngeal(NP) swabs in vial transport medium  Result Value Ref Range Status   SARS Coronavirus 2 by RT PCR NEGATIVE NEGATIVE Final    Comment: (NOTE) SARS-CoV-2 target nucleic acids are NOT DETECTED.  The SARS-CoV-2 RNA is generally detectable in upper respiratory specimens during the acute phase of infection. The lowest concentration of SARS-CoV-2 viral copies this assay can detect is 138 copies/mL. A negative result does not preclude SARS-Cov-2 infection and should not be used as the sole basis for treatment or other patient management decisions. A negative result may occur with  improper specimen collection/handling,  submission of specimen other than nasopharyngeal swab, presence of viral mutation(s) within the areas targeted by this assay, and inadequate number of viral copies(<138 copies/mL).  A negative result must be combined with clinical observations, patient history, and epidemiological information. The expected result is Negative.  Fact Sheet for Patients:  BloggerCourse.com  Fact Sheet for Healthcare Providers:  SeriousBroker.it  This test is no t yet approved or cleared by the [place] FDA and  has been authorized for detection and/or diagnosis of SARS-CoV-2 by FDA under an Emergency Use Authorization (EUA). This EUA will remain  in effect (meaning this test can be used) for the duration of the COVID-19 declaration under Section 564(b)(1) of the Act, 21 U.S.C.section 360bbb-3(b)(1), unless the authorization is terminated  or revoked sooner.       Influenza A by PCR NEGATIVE NEGATIVE Final   Influenza B by PCR NEGATIVE NEGATIVE Final    Comment: (NOTE) The Xpert Xpress SARS-CoV-2/FLU/RSV plus assay is intended as an aid in the diagnosis of influenza from Nasopharyngeal swab specimens and should not be used as a sole basis for treatment. Nasal washings and aspirates are unacceptable for Xpert Xpress SARS-CoV-2/FLU/RSV testing.  Fact Sheet for Patients: BloggerCourse.com  Fact Sheet for Healthcare Providers: SeriousBroker.it  This test is not yet approved or cleared by the [place] FDA and has been authorized for detection and/or diagnosis of SARS-CoV-2 by FDA under an Emergency Use Authorization (EUA). This EUA will remain in effect (meaning this test can be used) for the duration of the COVID-19 declaration under Section 564(b)(1) of the Act, 21 U.S.C. section 360bbb-3(b)(1), unless the authorization is terminated or revoked.  Performed at [facility], [address] [place], [place] [address]     Time coordinating discharge: Over 30 minutes  SIGNED:  Arnetha Courser, MD  [facility] 05/08/2020, 10:03 AM  If 7PM-7AM, please contact night-coverage [email or web address]  This record has been created using Conservation officer, historic buildings. Errors have been sought and corrected,but may not always be located. Such creation errors do not reflect on the standard of care.

## 2020-05-09 LAB — SURGICAL PATHOLOGY

## 2020-05-10 ENCOUNTER — Non-Acute Institutional Stay: Payer: Self-pay | Admitting: Adult Health Nurse Practitioner

## 2020-05-10 ENCOUNTER — Other Ambulatory Visit: Payer: Self-pay

## 2020-05-10 DIAGNOSIS — Z515 Encounter for palliative care: Secondary | ICD-10-CM

## 2020-05-10 DIAGNOSIS — J449 Chronic obstructive pulmonary disease, unspecified: Secondary | ICD-10-CM

## 2020-05-10 DIAGNOSIS — I742 Embolism and thrombosis of arteries of the upper extremities: Secondary | ICD-10-CM

## 2020-05-10 DIAGNOSIS — I5022 Chronic systolic (congestive) heart failure: Secondary | ICD-10-CM

## 2020-05-10 NOTE — Progress Notes (Signed)
Therapist, nutritional Palliative Care Consult Note Telephone: [phone]  Fax: [phone]  PATIENT NAME: Gary York DOB: 05-Feb-1922 MRN: [id number]  PRIMARY CARE PROVIDER: Dr. Einar Crow  REFERRING PROVIDER: Dr. Einar Crow  RESPONSIBLE PARTY:   March Rummage, daughter [phone] Nilan Iddings, son [phone]  Chief complaint: Initial palliative visit/debility    RECOMMENDATIONS and PLAN:  1.  Advanced care planning.  Patient is DNR.  Started speaking with daughter about today's visit via phone.  Had bad connection and scheduled for call back at 8:30 tomorrow morning to further conversation.  2.  Emboli.  Patient does have relief of right arm pain after embolectomy.  Started on Eliquis.  Do have concerns that upon return to facility he may have had a TIA with brief episode of left-sided weakness.  Also have concerns that with his poor judgment of getting up unassisted and multiple falls that being on Eliquis could cause bleeding episodes.  Continue supportive care at the facility.  3.  COPD.  Patient seems stable at this time with no recent COPD exacerbations.  He uses O2 at 2 L continuous and Trelegy Ellipta.  He is followed by pulmonology.  Continue follow-up and recommendations by pulmonology  4.  CHF.  Patient not having any edema today this seems as if his been well controlled.  No complaints of increased shortness of breath or cough.  Patient being followed by cardiology.  Continue follow-up and recommendations by cardiology  Patient does seem to be having decline especially after recent multiple emboli.  Palliative will continue to monitor for symptom management/decline and make recommendations as needed.  We will follow up in 2 to 4 weeks.  We will continue conversation with daughter tomorrow.  I spent 45 minutes providing this consultation, including time spent with patient/family, provider coordination, chart review including  latest labs and imaging, documentation. More than 50% of the time in this consultation was spent coordinating communication.   HISTORY OF PRESENT ILLNESS:  Gary York is a 87 y.o. year old male with multiple medical problems including CHF, COPD (O2 dependent at 2 L continuous), atherosclerosis, HTN, DMT2, HLD, anxiety, anemia. Palliative Care was asked to help address goals of care.  Family history includes brother with pancreatic cancer.  Patient has diagnosis of dementia listed only in 1 note on epic.  Otherwise this is not part of his PMH.  Staff does report that he has some behaviors indicating dementia such as short-term memory loss, forgetfulness, remembering when to walk at times and not others.  Patient not very engaging today and does not want to answer provider questions.  Patient only states that his stomach hurts and staff was notified of this.  Staff does report that he ate 75% of his breakfast this morning.  Late November 2021 patient did have hospitalization for COPD exacerbation.  Patient was in hospital 2/12 through 05/08/2020 for pain and pulselessness in his right arm.  Was found to have multiple blockages.  Did have embolectomy done and started on Eliquis.  On 05/08/2020 when patient came back to facility he had episode of left-sided weakness and drooling indicating a possible TIA/CVA.  EMS was called and after their assessment they did not think he needed to be seen in the ER.  Staff does report that he does not walk as well since this episode.  Patient's last fall was on 05/06/2020.  Most of his falls are related to him getting up unassisted.  Patient does require 2  person assist to stand and walk.  It is reported that the night staff have seen him getting up and walking unassisted.  He is becoming more incontinent of bladder and pulls off his oxygen more.  Weight is stable around 180 staff does report that he is starting to eat less than what he used to.  Staff reports that he does have  honeycomb dressing to right arm from embolectomy.  He does have some redness to his coccyx but skin is intact per staff.  Patient does not participate in HPI/ROS and this is mostly obtained through staff.  CODE STATUS: DNR  PPS: 30% HOSPICE ELIGIBILITY/DIAGNOSIS: TBD  PHYSICAL EXAM:   General: NAD, frail appearing Eyes: sclera anicteric and noninjected with no discharge noted ENMT: moist mucous membranes Pulmonary: normal respiratory effort Extremities: no edema, no joint deformities Skin: no rashes on exposed skin Neurological: Weakness; patient nonengaging and does not want to answer questions and does not want physical assessment.  PAST MEDICAL HISTORY:  Past Medical History:  Diagnosis Date  . CAD (coronary artery disease)   . Chronic systolic CHF (congestive heart failure) (HCC) 02/18/2020  . Complete heart block (HCC)   . COPD (chronic obstructive pulmonary disease) (HCC)   . Hyperlipidemia   . Hypertension     SOCIAL HX:  Social History   Tobacco Use  . Smoking status: Former Smoker    Packs/day: 1.00    Years: 20.00    Pack years: 20.00    Types: Cigarettes    Quit date: 1990    Years since quitting: 32.1  . Smokeless tobacco: Never Used  Substance Use Topics  . Alcohol use: Not Currently    ALLERGIES: No Known Allergies   PERTINENT MEDICATIONS:  Outpatient Encounter Medications as of 05/10/2020  Medication Sig  . acetaminophen (TYLENOL) 500 MG tablet Take 500 mg by mouth every 6 (six) hours as needed for mild pain, moderate pain or fever.   Marland Kitchen albuterol (PROVENTIL HFA;VENTOLIN HFA) 108 (90 Base) MCG/ACT inhaler Inhale 2 puffs into the lungs every 6 (six) hours as needed for wheezing or shortness of breath.  Marland Kitchen albuterol (PROVENTIL) (2.5 MG/3ML) 0.083% nebulizer solution Take 2.5 mg by nebulization every 6 (six) hours as needed for wheezing or shortness of breath.   Marland Kitchen apixaban (ELIQUIS) 5 MG TABS tablet Take 1 tablet (5 mg total) by mouth 2 (two) times daily.   Marland Kitchen aspirin EC 81 MG EC tablet Take 1 tablet (81 mg total) by mouth daily.  Marland Kitchen atorvastatin (LIPITOR) 80 MG tablet Take 1 tablet (80 mg total) by mouth daily at 6 PM.  . dextromethorphan-guaiFENesin (MUCINEX DM) 30-600 MG 12hr tablet Take 1 tablet by mouth at bedtime.  . docusate sodium (COLACE) 100 MG capsule Take 100 mg by mouth daily.  . Fluticasone-Umeclidin-Vilant (TRELEGY ELLIPTA) 100-62.5-25 MCG/INH AEPB Inhale 1 puff into the lungs daily.  . isosorbide mononitrate (IMDUR) 30 MG 24 hr tablet Take 30 mg by mouth daily.   Marland Kitchen losartan (COZAAR) 100 MG tablet Take 100 mg by mouth daily.  . magnesium hydroxide (MILK OF MAGNESIA) 400 MG/5ML suspension Take 30 mLs by mouth at bedtime as needed for mild constipation.  . metoprolol succinate (TOPROL-XL) 25 MG 24 hr tablet Take 25 mg by mouth daily.   . nitroGLYCERIN (NITROSTAT) 0.4 MG SL tablet Place 0.4 mg under the tongue every 5 (five) minutes as needed for chest pain.   . pantoprazole (PROTONIX) 40 MG tablet Take 40 mg by mouth daily.   Marland Kitchen  polyethylene glycol (MIRALAX / GLYCOLAX) packet Take 17 g by mouth daily as needed. (Patient taking differently: Take 17 g by mouth daily as needed for mild constipation or moderate constipation.)  . senna (SENOKOT) 8.6 MG TABS tablet Take 1 tablet by mouth daily.  . sertraline (ZOLOFT) 25 MG tablet Take 25 mg by mouth daily.   No facility-administered encounter medications on file as of 05/10/2020.      Tokiko Diefenderfer Marlena Clipper, NP

## 2020-05-12 ENCOUNTER — Telehealth: Payer: Self-pay | Admitting: Adult Health Nurse Practitioner

## 2020-05-12 NOTE — Telephone Encounter (Signed)
This is late entry, spoke with daughter yesterday. Spoke with daughter to update on visit of 05/10/20.  She is encouraged to call with any questions or concerns. Williams Dietrick K. Garner Nash NP

## 2020-05-13 ENCOUNTER — Emergency Department
Admission: EM | Admit: 2020-05-13 | Discharge: 2020-05-13 | Disposition: A | Discharge status: Home / Self Care | Payer: Medicare Other | Attending: Emergency Medicine | Admitting: Emergency Medicine

## 2020-05-13 ENCOUNTER — Emergency Department: Payer: Medicare Other

## 2020-05-13 ENCOUNTER — Other Ambulatory Visit
Admission: RE | Admit: 2020-05-13 | Discharge: 2020-05-13 | Disposition: A | Discharge status: Home / Self Care | Payer: Medicare Other | Source: Skilled Nursing Facility | Attending: Internal Medicine | Admitting: Internal Medicine

## 2020-05-13 DIAGNOSIS — I110 Hypertensive heart disease with heart failure: Secondary | ICD-10-CM | POA: Insufficient documentation

## 2020-05-13 DIAGNOSIS — R339 Retention of urine, unspecified: Secondary | ICD-10-CM | POA: Insufficient documentation

## 2020-05-13 DIAGNOSIS — R2231 Localized swelling, mass and lump, right upper limb: Secondary | ICD-10-CM | POA: Diagnosis present

## 2020-05-13 DIAGNOSIS — I5022 Chronic systolic (congestive) heart failure: Secondary | ICD-10-CM | POA: Diagnosis not present

## 2020-05-13 DIAGNOSIS — Z7901 Long term (current) use of anticoagulants: Secondary | ICD-10-CM | POA: Diagnosis not present

## 2020-05-13 DIAGNOSIS — R338 Other retention of urine: Secondary | ICD-10-CM

## 2020-05-13 DIAGNOSIS — Z7951 Long term (current) use of inhaled steroids: Secondary | ICD-10-CM | POA: Insufficient documentation

## 2020-05-13 DIAGNOSIS — Z7982 Long term (current) use of aspirin: Secondary | ICD-10-CM | POA: Diagnosis not present

## 2020-05-13 DIAGNOSIS — Z79899 Other long term (current) drug therapy: Secondary | ICD-10-CM | POA: Insufficient documentation

## 2020-05-13 DIAGNOSIS — Z950 Presence of cardiac pacemaker: Secondary | ICD-10-CM | POA: Diagnosis not present

## 2020-05-13 DIAGNOSIS — M7989 Other specified soft tissue disorders: Secondary | ICD-10-CM

## 2020-05-13 DIAGNOSIS — I2510 Atherosclerotic heart disease of native coronary artery without angina pectoris: Secondary | ICD-10-CM | POA: Insufficient documentation

## 2020-05-13 DIAGNOSIS — L03113 Cellulitis of right upper limb: Secondary | ICD-10-CM | POA: Diagnosis not present

## 2020-05-13 DIAGNOSIS — J441 Chronic obstructive pulmonary disease with (acute) exacerbation: Secondary | ICD-10-CM | POA: Insufficient documentation

## 2020-05-13 DIAGNOSIS — Z87891 Personal history of nicotine dependence: Secondary | ICD-10-CM | POA: Diagnosis not present

## 2020-05-13 DIAGNOSIS — R300 Dysuria: Secondary | ICD-10-CM | POA: Insufficient documentation

## 2020-05-13 LAB — URINALYSIS, COMPLETE (UACMP) WITH MICROSCOPIC
Bilirubin Urine: NEGATIVE
Glucose, UA: NEGATIVE mg/dL
Ketones, ur: NEGATIVE mg/dL
Leukocytes,Ua: NEGATIVE
Nitrite: NEGATIVE
Protein, ur: 30 mg/dL — AB
RBC / HPF: >50 RBC/hpf — ABNORMAL HIGH (ref 0–5)
Specific Gravity, Urine: 1.019 (ref 1.005–1.030)
pH: 5 (ref 5.0–8.0)

## 2020-05-13 LAB — CBC WITH DIFFERENTIAL/PLATELET
Abs Immature Granulocytes: 0.12 10*3/uL — ABNORMAL HIGH (ref 0.00–0.07)
Basophils Absolute: 0.1 10*3/uL (ref 0.0–0.1)
Basophils Relative: 1 %
Eosinophils Absolute: 0.1 10*3/uL (ref 0.0–0.5)
Eosinophils Relative: 1 %
HCT: 35.5 % — ABNORMAL LOW (ref 39.0–52.0)
Hemoglobin: 11.9 g/dL — ABNORMAL LOW (ref 13.0–17.0)
Immature Granulocytes: 1 %
Lymphocytes Relative: 4 %
Lymphs Abs: 0.6 10*3/uL — ABNORMAL LOW (ref 0.7–4.0)
MCH: 33 pg (ref 26.0–34.0)
MCHC: 33.5 g/dL (ref 30.0–36.0)
MCV: 98.3 fL (ref 80.0–100.0)
Monocytes Absolute: 1.1 10*3/uL — ABNORMAL HIGH (ref 0.1–1.0)
Monocytes Relative: 8 %
Neutro Abs: 12.7 10*3/uL — ABNORMAL HIGH (ref 1.7–7.7)
Neutrophils Relative %: 85 %
Platelets: 323 10*3/uL (ref 150–400)
RBC: 3.61 MIL/uL — ABNORMAL LOW (ref 4.22–5.81)
RDW: 14.7 % (ref 11.5–15.5)
WBC: 14.8 10*3/uL — ABNORMAL HIGH (ref 4.0–10.5)
nRBC: 0 % (ref 0.0–0.2)

## 2020-05-13 LAB — BASIC METABOLIC PANEL
Anion gap: 13 (ref 5–15)
BUN: 24 mg/dL — ABNORMAL HIGH (ref 8–23)
CO2: 25 mmol/L (ref 22–32)
Calcium: 9.3 mg/dL (ref 8.9–10.3)
Chloride: 97 mmol/L — ABNORMAL LOW (ref 98–111)
Creatinine, Ser: 1.15 mg/dL (ref 0.61–1.24)
GFR, Estimated: 58 mL/min — ABNORMAL LOW (ref >60)
Glucose, Bld: 132 mg/dL — ABNORMAL HIGH (ref 70–99)
Potassium: 3.9 mmol/L (ref 3.5–5.1)
Sodium: 135 mmol/L (ref 135–145)

## 2020-05-13 MED ORDER — LIDOCAINE-EPINEPHRINE 2 %-1:100000 IJ SOLN
20.0000 mL | Freq: Once | INTRAMUSCULAR | Status: AC
  Administered 2020-05-13: 20 mL
  Filled 2020-05-13: qty 1

## 2020-05-13 MED ORDER — SODIUM CHLORIDE 0.9 % IV BOLUS
500.0000 mL | Freq: Once | INTRAVENOUS | Status: AC
  Administered 2020-05-13: 500 mL via INTRAVENOUS

## 2020-05-13 MED ORDER — SULFAMETHOXAZOLE-TRIMETHOPRIM 200-40 MG/5ML PO SUSP: 20.0000 mL | Freq: Two times a day (BID) | ORAL | 0 refills | Status: AC

## 2020-05-13 MED ORDER — DIPHENHYDRAMINE HCL 12.5 MG/5ML PO ELIX
25.0000 mg | ORAL_SOLUTION | Freq: Once | ORAL | Status: AC
  Administered 2020-05-13: 25 mg via ORAL
  Filled 2020-05-13: qty 10

## 2020-05-13 MED ORDER — CEPHALEXIN 250 MG/5ML PO SUSR: 500.0000 mg | Freq: Three times a day (TID) | ORAL | 0 refills | Status: AC

## 2020-05-13 NOTE — ED Triage Notes (Signed)
Pt had a clot removal procedure today at [facility] right arm , swelling and redness to right arm

## 2020-05-13 NOTE — ED Provider Notes (Addendum)
[facility] Emergency Department Provider Note  ____________________________________________  Time seen: Approximately 5:45 PM  I have reviewed the triage vital signs and the nursing notes.   HISTORY  Chief Complaint Arm Pain (Right arm procedure, embalectomy??? Right arm , pt on thinners )    Level 5 Caveat: Portions of the History and Physical including HPI and review of systems are unable to be completely obtained due to patient being a poor historian   HPI Gary York is a 87 y.o. male with a history of CAD CHF COPD hypertension and possible dementia who is brought to the ED due to swelling and redness of the right arm, first noticed today.    Patient had a open thrombectomy procedure of the right subclavian artery 1 week ago.  Electronic medical record and operative note reviewed, uncomplicated, artery was sutured closed, soft tissues were sutured without issue.  Good perfusion distally afterward.   Patient was apparently doing well, and then today at his nursing facility he was noted to be having swelling and redness of the arm and was sent over here.  Discussed with family over at bedside who does not know of any additional symptoms.  She does note that he has not been eating or drinking much over the last 24 hours and has had decreased urine output.  Patient himself denies pain, states that he feels fine.  Does appear to be somewhat confused and may have a degree of dementia.   Past Medical History:  Diagnosis Date  . CAD (coronary artery disease)   . Chronic systolic CHF (congestive heart failure) (HCC) 02/18/2020  . Complete heart block (HCC)   . COPD (chronic obstructive pulmonary disease) (HCC)   . Hyperlipidemia   . Hypertension      Patient Active Problem List   Diagnosis Date Noted  . Limb ischemia 05/07/2020  . Occlusion of artery 05/06/2020  . 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 Leukocytosis 05/06/2020  . Internal  carotid artery occlusion, right 05/06/2020  . Pneumonia 02/19/2020  . COPD exacerbation (HCC) 02/18/2020  . Hyperglycemia 02/18/2020  . Chronic systolic CHF (congestive heart failure) (HCC) 02/18/2020  . Aortic stenosis 02/18/2020  . Hyponatremia 02/18/2020  . Hypocalcemia 02/18/2020  . Normocytic anemia 02/18/2020  . Hyperlipidemia   . Pain due to onychomycosis of toenails of both feet 01/21/2019  . CHF (congestive heart failure) (HCC) 09/16/2017  . Chest pain 09/09/2017     Past Surgical History:  Procedure Laterality Date  . PACEMAKER IMPLANT    . THROMBECTOMY BRACHIAL ARTERY Right 05/06/2020   Procedure: THROMBECTOMY BRACHIAL ARTERY;  Surgeon: Fransisco Hertz, MD;  Location: [facility] ORS;  Service: Vascular;  Laterality: Right;     Prior to Admission medications   Medication Sig Start Date End Date Taking? Authorizing Provider  cephALEXin (KEFLEX) 250 MG/5ML suspension Take 10 mLs (500 mg total) by mouth 3 (three) times daily for 10 days. 05/13/20 05/23/20 Yes Sharman Cheek, MD  sulfamethoxazole-trimethoprim (BACTRIM) 200-40 MG/5ML suspension Take 20 mLs by mouth 2 (two) times daily for 10 days. 05/13/20 05/23/20 Yes Sharman Cheek, MD  acetaminophen (TYLENOL) 500 MG tablet Take 500 mg by mouth every 6 (six) hours as needed for mild pain, moderate pain or fever.     [provider]  albuterol (PROVENTIL HFA;VENTOLIN HFA) 108 (90 Base) MCG/ACT inhaler Inhale 2 puffs into the lungs every 6 (six) hours as needed for wheezing or shortness of breath. 09/12/17   Enid Baas, MD  albuterol (PROVENTIL) (2.5 MG/3ML) 0.083% nebulizer solution Take 2.5 mg by nebulization every 6 (six) hours as needed for wheezing or shortness of breath.     [provider]  apixaban (ELIQUIS) 5 MG TABS tablet Take 1 tablet (5 mg total) by mouth 2 (two) times daily. 05/08/20   Arnetha CourserAmin, Sumayya, MD  aspirin EC 81 MG EC tablet Take 1 tablet (81 mg total) by mouth daily. 09/13/17   Enid BaasKalisetti,  Radhika, MD  atorvastatin (LIPITOR) 80 MG tablet Take 1 tablet (80 mg total) by mouth daily at 6 PM. 09/12/17   Enid BaasKalisetti, Radhika, MD  dextromethorphan-guaiFENesin [facility](MUCINEX DM) 30-600 MG 12hr tablet Take 1 tablet by mouth at bedtime.    [provider]  docusate sodium (COLACE) 100 MG capsule Take 100 mg by mouth daily.    [provider]  Fluticasone-Umeclidin-Vilant (TRELEGY ELLIPTA) 100-62.5-25 MCG/INH AEPB Inhale 1 puff into the lungs daily.    [provider]  isosorbide mononitrate (IMDUR) 30 MG 24 hr tablet Take 30 mg by mouth daily.     [provider]  losartan (COZAAR) 100 MG tablet Take 100 mg by mouth daily. 11/20/16   [provider]  magnesium hydroxide (MILK OF MAGNESIA) 400 MG/5ML suspension Take 30 mLs by mouth at bedtime as needed for mild constipation.    [provider]  metoprolol succinate (TOPROL-XL) 25 MG 24 hr tablet Take 25 mg by mouth daily.     [provider]  nitroGLYCERIN (NITROSTAT) 0.4 MG SL tablet Place 0.4 mg under the tongue every 5 (five) minutes as needed for chest pain.  08/10/18   [provider]  pantoprazole (PROTONIX) 40 MG tablet Take 40 mg by mouth daily.     [provider]  polyethylene glycol (MIRALAX / GLYCOLAX) packet Take 17 g by mouth daily as needed. Patient taking differently: Take 17 g by mouth daily as needed for mild constipation or moderate constipation. 09/18/17   Alford HighlandWieting, Richard, MD  senna (SENOKOT) 8.6 MG TABS tablet Take 1 tablet by mouth daily.    [provider]  sertraline (ZOLOFT) 25 MG tablet Take 25 mg by mouth daily. 04/25/20   [provider]     Allergies Patient has no known allergies.   Family History  Problem Relation Age of Onset  . Dementia Mother   . Diabetes Mother   . Dementia Father     Social History Social History   Tobacco Use  . Smoking status: Former Smoker    Packs/day: 1.00    Years: 20.00    Pack  years: 20.00    Types: Cigarettes    Quit date: 1990    Years since quitting: 32.1  . Smokeless tobacco: Never Used  Substance Use Topics  . Alcohol use: Not Currently  . Drug use: Never    Review of Systems Level 5 Caveat: Portions of the History and Physical including HPI and review of systems are unable to be completely obtained due to patient being a poor historian   Constitutional:   No known fever.  ENT:   No rhinorrhea. Cardiovascular:   No chest pain or syncope. Respiratory:   No dyspnea or cough. Gastrointestinal:   Negative for abdominal pain, vomiting and diarrhea.  Musculoskeletal:   Positive right arm swelling ____________________________________________   PHYSICAL EXAM:  VITAL SIGNS: ED Triage Vitals [05/13/20 1546]  Enc Vitals Group     BP (!) 178/92     Pulse Rate 70     Resp (!)  28     Temp 98.2 F (36.8 C)     Temp Source Oral     SpO2 92 %     Weight      Height      Head Circumference      Peak Flow      Pain Score      Pain Loc      Pain Edu?      Excl. in GC?     Vital signs reviewed, nursing assessments reviewed.   Constitutional:   Alert and oriented. Non-toxic appearance. Eyes:   Conjunctivae are normal. EOMI. PERRL. ENT      Head:   Normocephalic and atraumatic.      Nose:   No congestion/rhinnorhea.       Mouth/Throat:   MMM, no pharyngeal erythema. No peritonsillar mass.       Neck:   No meningismus. Full ROM. Hematological/Lymphatic/Immunilogical:   No cervical lymphadenopathy. Cardiovascular:   RRR. Symmetric bilateral radial and DP pulses.  No murmurs. Cap refill less than 2 seconds.  Normal brachial and radial pulses in the right arm.  Just distal to the right antecubital fossa there is an incision which is closed and without drainage.  There is fluctuant swelling of this area which is nonpulsatile.  On the radial side of this fluid collection, a normal pulse is palpated  Respiratory:   Normal respiratory effort without  tachypnea/retractions. Breath sounds are clear and equal bilaterally. No wheezes/rales/rhonchi. Gastrointestinal:   Soft and nontender. Non distended. There is no CVA tenderness.  No rebound, rigidity, or guarding.  Musculoskeletal:   Normal range of motion in all extremities. No joint effusions.  No lower extremity tenderness.  Diffuse edema of the right upper extremity below the elbow.  There is ecchymosis of the right forearm diffusely. Neurologic:   Normal speech, limited language expression Motor grossly intact. No acute focal neurologic deficits are appreciated.  Skin:    Skin is warm, dry and intact.  Patient has papular rash all over his back and upper arms, it blanches, gets itchy.  Appearance consistent with contact dermatitis.  Doubt drug rash or vasculitis, not consistent with cellulitis or fungal skin infection.  No petechia purpura or bullae. ____________________________________________    LABS (pertinent positives/negatives) (all labs ordered are listed, but only abnormal results are displayed) Labs Reviewed  BASIC METABOLIC PANEL - Abnormal; Notable for the following components:      Result Value   Chloride 97 (*)    Glucose, Bld 132 (*)    BUN 24 (*)    GFR, Estimated 58 (*)    All other components within normal limits  CBC WITH DIFFERENTIAL/PLATELET - Abnormal; Notable for the following components:   WBC 14.8 (*)    RBC 3.61 (*)    Hemoglobin 11.9 (*)    HCT 35.5 (*)    Neutro Abs 12.7 (*)    Lymphs Abs 0.6 (*)    Monocytes Absolute 1.1 (*)    Abs Immature Granulocytes 0.12 (*)    All other components within normal limits   ____________________________________________   EKG    ____________________________________________    RADIOLOGY  US Venous Img Upper Uni Right(DVT)  Result Date: 05/13/2020 CLINICAL DATA:  RIGHT arm arterial thrombectomy 1 week ago, arm edema, swelling at incision site, history hypertension, COPD, coronary artery disease, CHF EXAM:  RIGHT UPPER EXTREMITY VENOUS DOPPLER ULTRASOUND TECHNIQUE: Gray-scale sonography with graded compression, as well as color Doppler and duplex ultrasound were performed to  evaluate the upper extremity deep venous system from the level of the subclavian vein and including the jugular, axillary, basilic, radial, ulnar and upper cephalic vein. Spectral Doppler was utilized to evaluate flow at rest and with distal augmentation maneuvers. COMPARISON:  None FINDINGS: Contralateral Subclavian Vein: Respiratory phasicity is normal and symmetric with the symptomatic side. No evidence of thrombus. Normal compressibility. Internal Jugular Vein: No evidence of thrombus. Normal compressibility, respiratory phasicity and response to augmentation. Subclavian Vein: No evidence of thrombus. Normal compressibility, respiratory phasicity and response to augmentation. Axillary Vein: No evidence of thrombus. Normal compressibility, respiratory phasicity and response to augmentation. Cephalic Vein: No evidence of thrombus. Normal compressibility, respiratory phasicity and response to augmentation. Basilic Vein: No evidence of thrombus. Normal compressibility, respiratory phasicity and response to augmentation. Brachial Veins: No evidence of thrombus. Normal compressibility, respiratory phasicity and response to augmentation. Radial Veins: No evidence of thrombus. Normal compressibility, respiratory phasicity and response to augmentation. Ulnar Veins: No evidence of thrombus. Normal compressibility, respiratory phasicity and response to augmentation. Venous Reflux:  None visualized. Other Findings: At incision site, a subcutaneous complex fluid collection is identified 3.5 x 2.3 x 3.9 cm in size. Scattered diffuse low level internal echogenicity, heterogeneous. No internal blood flow on color Doppler imaging. No surrounding hypervascularity. Finding is consistent with a subcutaneous hematoma. IMPRESSION: No evidence of DVT within the RIGHT  upper extremity. Subcutaneous hematoma at the incision site 3.5 x 2.3 x 3.9 cm in size. Electronically Signed   By: Ulyses Southward M.D.   On: 05/13/2020 18:08    ____________________________________________   PROCEDURES .Marland KitchenIncision and Drainage  Date/Time: 05/13/2020 9:04 PM Performed by: Sharman Cheek, MD Authorized by: Sharman Cheek, MD   Consent:    Consent obtained:  Verbal   Consent given by:  Patient (daughter at bedside)   Risks, benefits, and alternatives were discussed: yes     Risks discussed:  Bleeding, infection, incomplete drainage and pain   Alternatives discussed:  Alternative treatment, delayed treatment and observation Universal protocol:    Procedure explained and questions answered to patient or proxy's satisfaction: yes     Imaging studies available: yes     Site/side marked: yes     Immediately prior to procedure, a time out was called: yes     Patient identity confirmed:  Verbally with patient and arm band Location:    Type:  Hematoma   Size:  4 cm   Location:  Upper extremity   Upper extremity location:  Arm   Arm location:  R lower arm Pre-procedure details:    Skin preparation:  Povidone-iodine Sedation:    Sedation type:  None Anesthesia:    Anesthesia method:  Local infiltration   Local anesthetic:  Lidocaine 2% WITH epi Procedure type:    Complexity:  Complex Procedure details:    Ultrasound guidance: no     Needle aspiration: no     Incision types:  Single straight (reopening of proximal 1.5cm of recent surgical incision)   Incision depth:  Subcutaneous   Wound management:  Probed and deloculated and irrigated with saline   Drainage:  Bloody   Drainage amount:  Moderate   Wound treatment:  Wound left open   Packing materials:  None Post-procedure details:    Procedure completion:  Tolerated well, no immediate complications    ____________________________________________  DIFFERENTIAL DIAGNOSIS   Right arm DVT, pseudoaneurysm,  cellulitis, postoperative swelling related to healing process  CLINICAL IMPRESSION / ASSESSMENT AND PLAN / ED COURSE  Medications  ordered in the ED: Medications  sodium chloride 0.9 % bolus 500 mL (0 mLs Intravenous Stopped 05/13/20 1901)  lidocaine-EPINEPHrine (XYLOCAINE W/EPI) 2 %-1:100000 (with pres) injection 20 mL (20 mLs Infiltration Given by Other 05/13/20 2020)    Pertinent labs & imaging results that were available during my care of the patient were reviewed by me and considered in my medical decision making (see chart for details).   Gary York was evaluated in Emergency Department on 05/13/2020 for the symptoms described in the history of present illness. He was evaluated in the context of the global COVID-19 pandemic, which necessitated consideration that the patient might be at risk for infection with the SARS-CoV-2 virus that causes COVID-19. Institutional protocols and algorithms that pertain to the evaluation of patients at risk for COVID-19 are in a state of rapid change based on information released by regulatory bodies including the CDC and federal and state organizations. These policies and algorithms were followed during the patient's care in the ED.   Patient presents with swelling and redness of the right arm 1 week after having arterial thrombectomy from that side.  There is a fluid collection at the incision site which may be hematoma, but based on my exam and bedside point-of-care ultrasound visualization, does not appear to be a pseudoaneurysm.  I think this is likely to be sequelae of surgery and the healing process, will obtain ultrasound to rule out DVT, check labs due to poor oral intake in the last 24 hours.  EMR reviewed, he had a urinalysis recently done which is not consistent with infection.  ----------------------------------------- 9:03 PM on 05/13/2020 -----------------------------------------  Bladder scan showed greater than a liter, so Foley catheter  inserted.  Urine output was nonbloody.  Creatinine unremarkable.  Ultrasound right upper extremity negative for DVT or pseudoaneurysm.  Leukocytosis and erythema and swelling concerning for developing cellulitis, so I&D of hematoma performed at bedside for source control.  Vital signs remain normal, stable for discharge.  No evidence of sepsis. Intact distal radial pulse after procedure.      ____________________________________________   FINAL CLINICAL IMPRESSION(S) / ED DIAGNOSES    Final diagnoses:  Arm swelling  Right arm cellulitis  Acute urinary retention     ED Discharge Orders         Ordered    cephALEXin (KEFLEX) 250 MG/5ML suspension  3 times daily        05/13/20 2100    sulfamethoxazole-trimethoprim (BACTRIM) 200-40 MG/5ML suspension  2 times daily        05/13/20 2100          Portions of this note were generated with dragon dictation software. Dictation errors may occur despite best attempts at proofreading.   Sharman Cheek, MD 05/13/20 2106    Sharman Cheek, MD 05/13/20 2116

## 2020-05-13 NOTE — Discharge Instructions (Addendum)
The ultrasound of your right arm did now show any blood clots or arterial bleeding.  A hematoma just below the elbow was drained in the ER.  There appears to be some infection of the skin of the arm, so please take antibiotics as prescribed and continue monitoring the area to ensure it improves.

## 2020-05-13 NOTE — ED Notes (Signed)
[facility] to transport to [facility] 

## 2020-05-13 NOTE — ED Notes (Signed)
Bladder scan >1000 ml md notified

## 2020-05-15 ENCOUNTER — Other Ambulatory Visit
Admission: RE | Admit: 2020-05-15 | Discharge: 2020-05-15 | Disposition: A | Discharge status: Home / Self Care | Payer: Medicare Other | Source: Ambulatory Visit | Attending: Internal Medicine | Admitting: Internal Medicine

## 2020-05-15 DIAGNOSIS — R300 Dysuria: Secondary | ICD-10-CM | POA: Diagnosis present

## 2020-05-15 LAB — BASIC METABOLIC PANEL
Anion gap: 10 (ref 5–15)
BUN: 27 mg/dL — ABNORMAL HIGH (ref 8–23)
CO2: 24 mmol/L (ref 22–32)
Calcium: 8.7 mg/dL — ABNORMAL LOW (ref 8.9–10.3)
Chloride: 100 mmol/L (ref 98–111)
Creatinine, Ser: 1.02 mg/dL (ref 0.61–1.24)
GFR, Estimated: >60 mL/min (ref >60)
Glucose, Bld: 113 mg/dL — ABNORMAL HIGH (ref 70–99)
Potassium: 3.7 mmol/L (ref 3.5–5.1)
Sodium: 134 mmol/L — ABNORMAL LOW (ref 135–145)

## 2020-05-15 LAB — CBC WITH DIFFERENTIAL/PLATELET
Abs Immature Granulocytes: 0.07 10*3/uL (ref 0.00–0.07)
Basophils Absolute: 0.1 10*3/uL (ref 0.0–0.1)
Basophils Relative: 1 %
Eosinophils Absolute: 0.5 10*3/uL (ref 0.0–0.5)
Eosinophils Relative: 3 %
HCT: 30.2 % — ABNORMAL LOW (ref 39.0–52.0)
Hemoglobin: 10 g/dL — ABNORMAL LOW (ref 13.0–17.0)
Immature Granulocytes: 1 %
Lymphocytes Relative: 4 %
Lymphs Abs: 0.6 10*3/uL — ABNORMAL LOW (ref 0.7–4.0)
MCH: 33 pg (ref 26.0–34.0)
MCHC: 33.1 g/dL (ref 30.0–36.0)
MCV: 99.7 fL (ref 80.0–100.0)
Monocytes Absolute: 1.4 10*3/uL — ABNORMAL HIGH (ref 0.1–1.0)
Monocytes Relative: 9 %
Neutro Abs: 12 10*3/uL — ABNORMAL HIGH (ref 1.7–7.7)
Neutrophils Relative %: 82 %
Platelets: 255 10*3/uL (ref 150–400)
RBC: 3.03 MIL/uL — ABNORMAL LOW (ref 4.22–5.81)
RDW: 15 % (ref 11.5–15.5)
WBC: 14.6 10*3/uL — ABNORMAL HIGH (ref 4.0–10.5)
nRBC: 0 % (ref 0.0–0.2)

## 2020-05-15 LAB — URINE CULTURE: Culture: NO GROWTH

## 2020-05-18 ENCOUNTER — Other Ambulatory Visit: Payer: Self-pay

## 2020-05-18 ENCOUNTER — Non-Acute Institutional Stay: Payer: Self-pay | Admitting: Adult Health Nurse Practitioner

## 2020-05-18 DIAGNOSIS — E46 Unspecified protein-calorie malnutrition: Secondary | ICD-10-CM

## 2020-05-18 DIAGNOSIS — I998 Other disorder of circulatory system: Secondary | ICD-10-CM

## 2020-05-18 DIAGNOSIS — I5022 Chronic systolic (congestive) heart failure: Secondary | ICD-10-CM

## 2020-05-18 DIAGNOSIS — R5381 Other malaise: Secondary | ICD-10-CM

## 2020-05-18 DIAGNOSIS — Z515 Encounter for palliative care: Secondary | ICD-10-CM

## 2020-05-18 NOTE — Progress Notes (Signed)
Therapist, nutritional Palliative Care Consult Note Telephone: [phone]  Fax: [phone]  PATIENT NAME: Gary York DOB: 10-27-1921 MRN: [id number]  PRIMARY CARE PROVIDER:  Dr. Einar Crow  REFERRING PROVIDER: Dr. Einar Crow  RESPONSIBLE PARTY:   March Rummage, daughter [phone] Nivaan Dicenzo, son [phone]  Chief complaint: Initial palliative visit/debility    RECOMMENDATIONS and PLAN:  1.  Advanced care planning.  Patient is DNR.  2.  CHF/atherosclerosis.  Patient has multiple cardiac issues.  Recently having multiple blood clots with recent embolectomy and right arm.  Suspect with recent decline in functional status and cognitive impairment that patient may be having TIAs.  3.  PCM/debility.  Patient's appetite drastically decreased with about 11 pound weight loss in 1 week.  Patient only taking in sips and bites.  Patient requiring increased assistance with standing, walking, ADLs.  Discussed hospice with daughter.  Daughter would like hospice services for her father.  Hospice referral put in by PCP at facility.  Daughter did state that hospice has called her and will have hospice evaluation this coming weekend.   I spent 35 minutes providing this consultation, including time spent with patient/family, provider coordination, chart review, documentation. More than 50% of the time in this consultation was spent coordinating communication.   HISTORY OF PRESENT ILLNESS:  Gary York is a 87 y.o. year old male with multiple medical problems including CHF, COPD (O2 dependent at 2 L continuous), atherosclerosis, HTN, DMT2, HLD, anxiety, anemia. Palliative Care was asked to help address goals of care.  Patient was seen in the ED on 05/13/2020 for right arm pain.  Patient had hematoma in area of previous embolectomy.  Staff reports decreased appetite he is only taking in bites and sips.  Patient was about 180 pounds on 05/10/2020 and on  05/15/2020 is 169.6 pounds.  Patient is having more lethargy and restlessness.  During last ED visit Foley catheter had to be placed due to urinary retention.  He is having blood in his urine.  Staff does report that patient states he is going to die soon.  Patient is no longer to get up and down on his own.  Requires at least 1 person assist with walker to ambulate.  Now was requiring his medications to be crushed.  Patient does state today that he has mental pain but not physical pain.  After this patient becomes incoherent and unable to understand what he is talking about.  CODE STATUS: DNR  PPS: 30% HOSPICE ELIGIBILITY/DIAGNOSIS: yes/atherosclerosis, protein calorie malnutrition  PHYSICAL EXAM:  HR 76 O2 93% on 2 L General: NAD, frail appearing Eyes: sclera anicteric and noninjected with no discharge noted ENMT: moist mucous membranes Cardiovascular: Regular rate and rhythm Pulmonary: Lung sounds clear normal respiratory effort Extremities: no edema, no joint deformities Skin: no rashes on exposed skin Neurological: Weakness;  patient alert and oriented to person and place; at times having incoherent speech.  PAST MEDICAL HISTORY:  Past Medical History:  Diagnosis Date  . CAD (coronary artery disease)   . Chronic systolic CHF (congestive heart failure) (HCC) 02/18/2020  . Complete heart block (HCC)   . COPD (chronic obstructive pulmonary disease) (HCC)   . Hyperlipidemia   . Hypertension     SOCIAL HX:  Social History   Tobacco Use  . Smoking status: Former Smoker    Packs/day: 1.00    Years: 20.00    Pack years: 20.00    Types: Cigarettes    Quit date:  1990    Years since quitting: 32.1  . Smokeless tobacco: Never Used  Substance Use Topics  . Alcohol use: Not Currently    ALLERGIES: No Known Allergies   PERTINENT MEDICATIONS:  Outpatient Encounter Medications as of 05/18/2020  Medication Sig  . acetaminophen (TYLENOL) 500 MG tablet Take 500 mg by mouth every 6  (six) hours as needed for mild pain, moderate pain or fever.   Marland Kitchen albuterol (PROVENTIL HFA;VENTOLIN HFA) 108 (90 Base) MCG/ACT inhaler Inhale 2 puffs into the lungs every 6 (six) hours as needed for wheezing or shortness of breath.  Marland Kitchen albuterol (PROVENTIL) (2.5 MG/3ML) 0.083% nebulizer solution Take 2.5 mg by nebulization every 6 (six) hours as needed for wheezing or shortness of breath.   Marland Kitchen apixaban (ELIQUIS) 5 MG TABS tablet Take 1 tablet (5 mg total) by mouth 2 (two) times daily.  Marland Kitchen aspirin EC 81 MG EC tablet Take 1 tablet (81 mg total) by mouth daily.  Marland Kitchen atorvastatin (LIPITOR) 80 MG tablet Take 1 tablet (80 mg total) by mouth daily at 6 PM.  . cephALEXin (KEFLEX) 250 MG/5ML suspension Take 10 mLs (500 mg total) by mouth 3 (three) times daily for 10 days.  Marland Kitchen dextromethorphan-guaiFENesin (MUCINEX DM) 30-600 MG 12hr tablet Take 1 tablet by mouth at bedtime.  . docusate sodium (COLACE) 100 MG capsule Take 100 mg by mouth daily.  . Fluticasone-Umeclidin-Vilant (TRELEGY ELLIPTA) 100-62.5-25 MCG/INH AEPB Inhale 1 puff into the lungs daily.  . isosorbide mononitrate (IMDUR) 30 MG 24 hr tablet Take 30 mg by mouth daily.   Marland Kitchen losartan (COZAAR) 100 MG tablet Take 100 mg by mouth daily.  . magnesium hydroxide (MILK OF MAGNESIA) 400 MG/5ML suspension Take 30 mLs by mouth at bedtime as needed for mild constipation.  . metoprolol succinate (TOPROL-XL) 25 MG 24 hr tablet Take 25 mg by mouth daily.   . nitroGLYCERIN (NITROSTAT) 0.4 MG SL tablet Place 0.4 mg under the tongue every 5 (five) minutes as needed for chest pain.   . pantoprazole (PROTONIX) 40 MG tablet Take 40 mg by mouth daily.   . polyethylene glycol (MIRALAX / GLYCOLAX) packet Take 17 g by mouth daily as needed. (Patient taking differently: Take 17 g by mouth daily as needed for mild constipation or moderate constipation.)  . senna (SENOKOT) 8.6 MG TABS tablet Take 1 tablet by mouth daily.  . sertraline (ZOLOFT) 25 MG tablet Take 25 mg by mouth  daily.  Marland Kitchen sulfamethoxazole-trimethoprim (BACTRIM) 200-40 MG/5ML suspension Take 20 mLs by mouth 2 (two) times daily for 10 days.   No facility-administered encounter medications on file as of 05/18/2020.    Bernise Sylvain Marlena Clipper, NP

## 2020-09-22 DEATH — deceased

## 2021-10-23 IMAGING — DX DG CHEST 1V PORT
1 series · 1 of 1 positions shown · non-contrast
Comparison: 02/18/2020

CLINICAL DATA: Assess for widened mediastinum.

EXAM:
PORTABLE CHEST 1 VIEW

[chest ap]
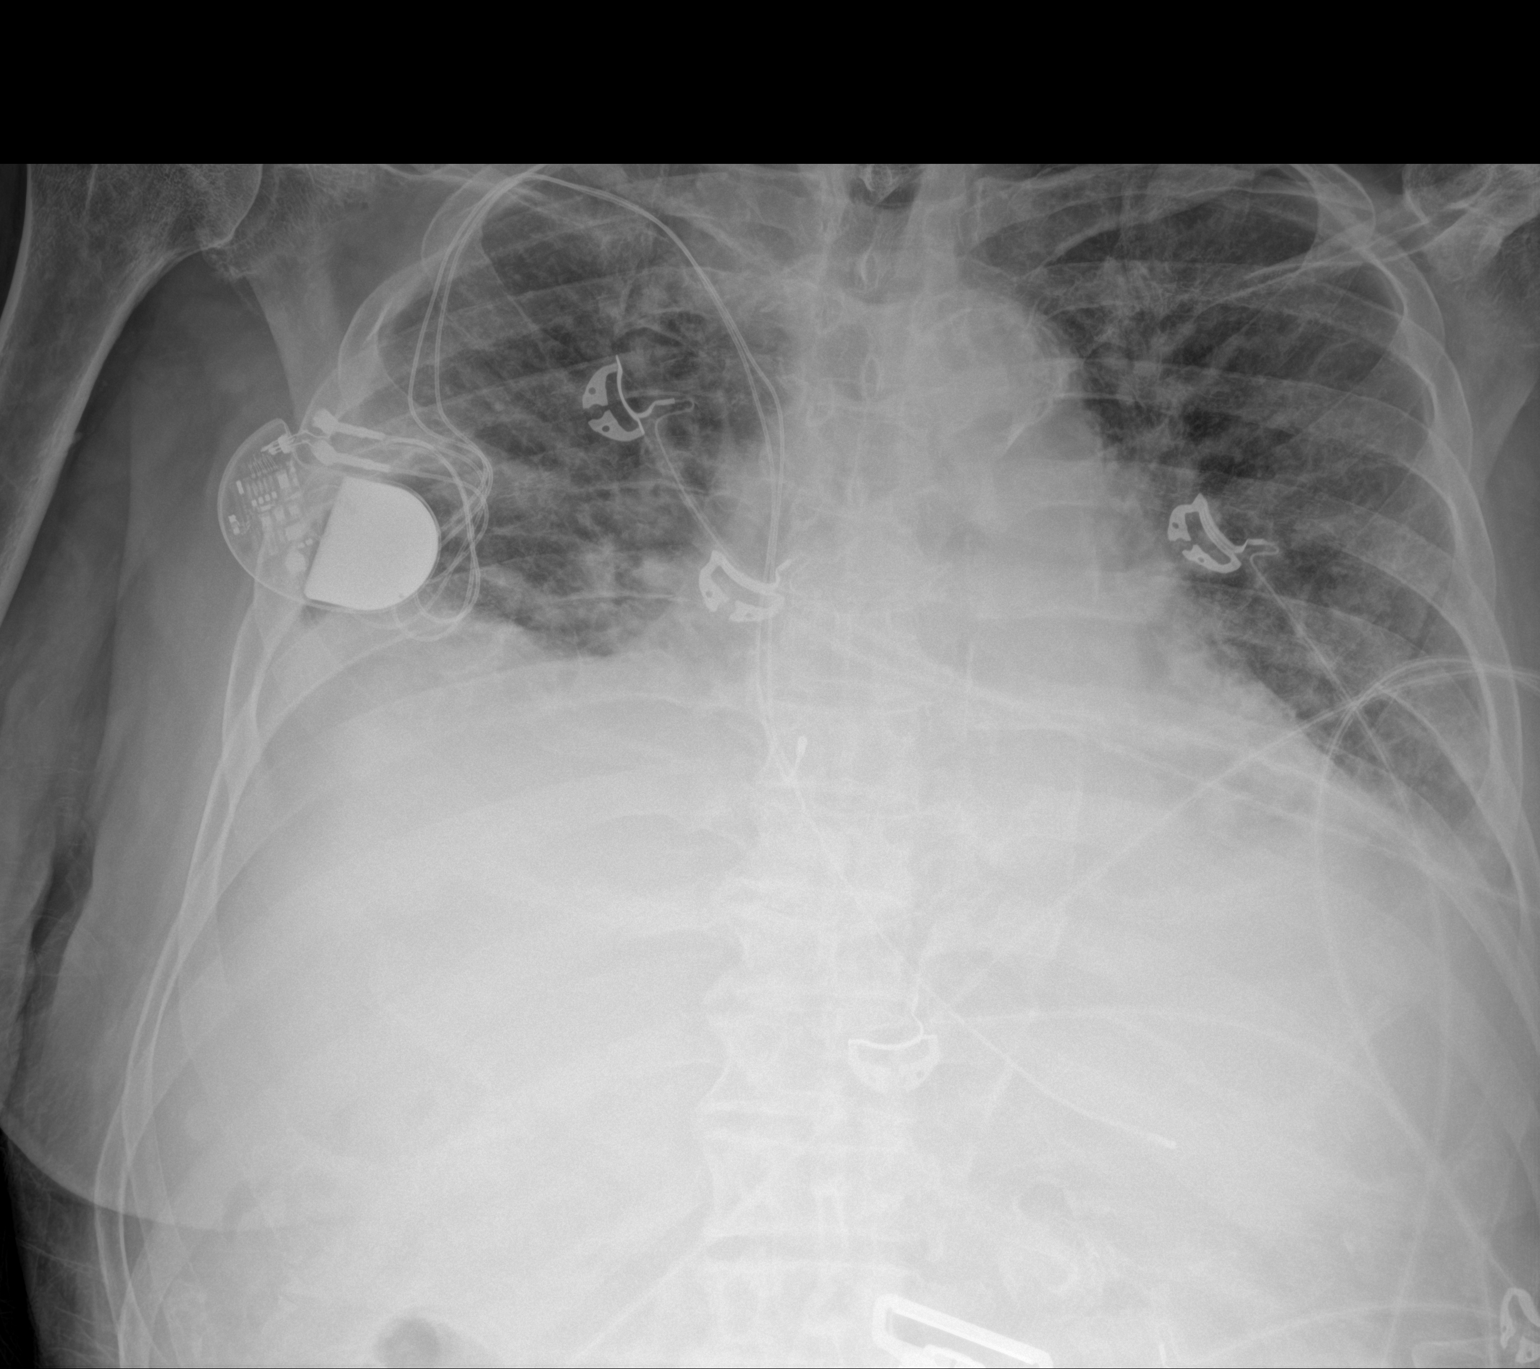

[1 of 1 positions shown; findings below may reference images not displayed]

FINDINGS: Cardiac silhouette partly obscured by elevated hemidiaphragms. It
appears mildly enlarged.

No mediastinal widening. No evidence of a mediastinal or hilar mass.

Low lung volumes with vascular congestion. There is additional
opacity at the bases is likely due to atelectasis. Possible small
effusions.

No pneumothorax.

Right anterior chest wall sequential pacemaker is stable.

Skeletal structures grossly intact.
IMPRESSION: 1. No mediastinal widening.
2. Vascular congestion without overt pulmonary edema. No evidence of
pneumonia.
3. Additional lung base opacity that is likely atelectasis. Small
effusions possible.
